# Patient Record
Sex: Female | Born: 1935 | Race: Black or African American | Hispanic: No | Marital: Married | Smoking: Never smoker
Health system: Southern US, Community
[De-identification: ages and names within clinical notes are randomized; demographics above are authoritative.]

## PROBLEM LIST (undated history)

## (undated) DIAGNOSIS — R002 Palpitations: Secondary | ICD-10-CM

## (undated) DIAGNOSIS — I479 Paroxysmal tachycardia, unspecified: Secondary | ICD-10-CM

## (undated) DIAGNOSIS — R000 Tachycardia, unspecified: Secondary | ICD-10-CM

## (undated) DIAGNOSIS — R413 Other amnesia: Secondary | ICD-10-CM

## (undated) DIAGNOSIS — R269 Unspecified abnormalities of gait and mobility: Secondary | ICD-10-CM

## (undated) DIAGNOSIS — M1990 Unspecified osteoarthritis, unspecified site: Secondary | ICD-10-CM

## (undated) DIAGNOSIS — M069 Rheumatoid arthritis, unspecified: Secondary | ICD-10-CM

## (undated) DIAGNOSIS — F068 Other specified mental disorders due to known physiological condition: Secondary | ICD-10-CM

## (undated) DIAGNOSIS — R32 Unspecified urinary incontinence: Secondary | ICD-10-CM

## (undated) HISTORY — DX: Unspecified osteoarthritis, unspecified site: M19.90

## (undated) HISTORY — DX: Unspecified urinary incontinence: R32

## (undated) HISTORY — DX: Other amnesia: R41.3

## (undated) HISTORY — DX: Paroxysmal tachycardia, unspecified: I47.9

## (undated) HISTORY — DX: Tachycardia, unspecified: R00.0

## (undated) HISTORY — DX: Rheumatoid arthritis, unspecified: M06.9

## (undated) HISTORY — DX: Unspecified abnormalities of gait and mobility: R26.9

## (undated) HISTORY — DX: Palpitations: R00.2

## (undated) HISTORY — DX: Other specified mental disorders due to known physiological condition: F06.8

---

## 1999-05-24 ENCOUNTER — Encounter: Payer: Self-pay | Admitting: Internal Medicine

## 2000-01-27 ENCOUNTER — Other Ambulatory Visit: Admission: RE | Admit: 2000-01-27 | Discharge: 2000-01-27 | Payer: Self-pay | Admitting: Internal Medicine

## 2000-08-08 ENCOUNTER — Ambulatory Visit (HOSPITAL_COMMUNITY): Admission: RE | Admit: 2000-08-08 | Discharge: 2000-08-08 | Payer: Self-pay | Admitting: Gastroenterology

## 2000-08-11 ENCOUNTER — Ambulatory Visit (HOSPITAL_COMMUNITY): Admission: RE | Admit: 2000-08-11 | Discharge: 2000-08-11 | Payer: Self-pay | Admitting: Internal Medicine

## 2000-08-11 ENCOUNTER — Encounter: Payer: Self-pay | Admitting: Internal Medicine

## 2003-08-12 ENCOUNTER — Ambulatory Visit (HOSPITAL_COMMUNITY): Admission: RE | Admit: 2003-08-12 | Discharge: 2003-08-12 | Payer: Self-pay | Admitting: *Deleted

## 2006-06-26 ENCOUNTER — Ambulatory Visit (HOSPITAL_COMMUNITY): Admission: RE | Admit: 2006-06-26 | Discharge: 2006-06-26 | Payer: Self-pay | Admitting: *Deleted

## 2012-03-15 ENCOUNTER — Other Ambulatory Visit: Payer: Self-pay | Admitting: Neurology

## 2012-03-15 DIAGNOSIS — M069 Rheumatoid arthritis, unspecified: Secondary | ICD-10-CM

## 2012-03-15 DIAGNOSIS — F0390 Unspecified dementia without behavioral disturbance: Secondary | ICD-10-CM

## 2012-04-30 ENCOUNTER — Other Ambulatory Visit: Payer: Self-pay

## 2012-05-01 ENCOUNTER — Other Ambulatory Visit: Payer: Self-pay

## 2012-05-03 ENCOUNTER — Ambulatory Visit
Admission: RE | Admit: 2012-05-03 | Discharge: 2012-05-03 | Disposition: A | Discharge status: Home / Self Care | Payer: Self-pay | Source: Ambulatory Visit | Attending: Neurology | Admitting: Neurology

## 2012-05-03 DIAGNOSIS — M069 Rheumatoid arthritis, unspecified: Secondary | ICD-10-CM

## 2012-05-03 DIAGNOSIS — F0390 Unspecified dementia without behavioral disturbance: Secondary | ICD-10-CM

## 2012-12-26 ENCOUNTER — Encounter: Payer: Self-pay | Admitting: Nurse Practitioner

## 2012-12-26 ENCOUNTER — Telehealth: Payer: Self-pay | Admitting: *Deleted

## 2012-12-26 NOTE — Telephone Encounter (Signed)
Spoke to the patient and daughter was not home. Patient confirmed appt but not sure if she will give message to the daughter.

## 2012-12-27 ENCOUNTER — Ambulatory Visit (INDEPENDENT_AMBULATORY_CARE_PROVIDER_SITE_OTHER): Payer: 59 | Admitting: Nurse Practitioner

## 2012-12-27 ENCOUNTER — Encounter: Payer: Self-pay | Admitting: Nurse Practitioner

## 2012-12-27 VITALS — BP 108/76 | HR 136 | Ht 67.0 in | Wt 144.0 lb

## 2012-12-27 DIAGNOSIS — F068 Other specified mental disorders due to known physiological condition: Secondary | ICD-10-CM

## 2012-12-27 DIAGNOSIS — G9340 Encephalopathy, unspecified: Secondary | ICD-10-CM | POA: Insufficient documentation

## 2012-12-27 DIAGNOSIS — R269 Unspecified abnormalities of gait and mobility: Secondary | ICD-10-CM | POA: Insufficient documentation

## 2012-12-27 NOTE — Progress Notes (Signed)
[facility]  PATIENT: Darlene Daniels DOB: September 30, 1924   REASON FOR VISIT: Memory loss   HISTORY OF PRESENT ILLNESS:  79 year old right-handed African American female, accompanied by her daughter, returns for followup. She has a history of dementia.  She had a past medical history of rheumatoid arthritis, recent flareup, is currently on methotrexate to 4 tablets each week, Humira infusion every week for one year, her rheumatoid arthritis is under better control now.  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 Daughter however does report frequent falls, she did not have an assistive device, she gets no regular exercise Her mother suffered Alzheimer's disease,    REVIEW OF SYSTEMS: Full 14 system review of systems performed and notable only for:  Constitutional: N/A  Cardiovascular: N/A  Ear/Nose/Throat: N/A  Skin: N/A  Eyes: N/A  Respiratory: N/A  Gastroitestinal: Urinary incontinence Hematology/Lymphatic: N/A  Endocrine: N/A Musculoskeletal:N/A  Allergy/Immunology: N/A  Neurological: Dizziness, falls Psychiatric: Disinterest in activities  ALLERGIES: Allergies  Allergen Reactions  . Sulfa Antibiotics     HOME MEDICATIONS: Outpatient Prescriptions Prior to Visit  Medication Sig Dispense Refill  . celecoxib (CELEBREX) 200 MG capsule Take  200 mg by mouth daily.      . Cholecalciferol (VITAMIN D3) 2000 UNITS TABS Take 5 Units by mouth.       . Memantine HCl ER (NAMENDA XR) 28 MG CP24 Take 28 mg by mouth daily.      . Multiple Vitamin (MULTIVITAMIN) capsule Take 1 capsule by mouth daily.      Marland Kitchen oxybutynin (DITROPAN) 5 MG tablet Take 5 mg by mouth daily. One half daily      . FA-Pyridoxine-Cyancobalamin (FOLBIC PO) Take by mouth.       No facility-administered medications prior to visit.    PAST MEDICAL HISTORY: Past Medical History  Diagnosis Date  . Memory loss     PAST SURGICAL HISTORY: History reviewed. No pertinent past surgical history.  FAMILY HISTORY: Mother had Alzheimers disease  SOCIAL HISTORY: History   Social History  . Marital Status: Married    Spouse Name: N/A    Number of Children: daughter  . Years of Education: PHD   Occupational History   Pension scheme manager.   Social History Main Topics  . Smoking status: Never Smoker   . Smokeless tobacco: Never Used  . Alcohol Use: No  . Drug Use: No  . Sexual Activity: Not on file   Other Topics Concern  . Not on file   Social History Narrative  .   patient lives with her husband and is retired      PHYSICAL EXAM  Filed Vitals:   12/27/12 1537  BP: 108/76  Pulse: 136  Height: 5\' 7"  (1.702 m)  Weight: 144 lb (65.318 kg)   Body mass index is 22.55 kg/(m^2).  Generalized: Well developed, in no acute distress  Head: normocephalic and  atraumatic,. Oropharynx benign  Neck: Supple, no carotid bruits  Cardiac: Regular rate rhythm, no murmur  Musculoskeletal: Rheumatoid arthritis Neurological examination   Mentation: Alert , MMSE 26/30. AFT 10.  Follows all commands speech and language fluent  Cranial nerve II-XII: Pupils were equal round reactive to light extraocular movements were full, visual field were full on confrontational test. Facial sensation and strength were normal. hearing was intact to finger rubbing bilaterally.  Uvula tongue midline. head turning and shoulder shrug and were normal and symmetric.Tongue protrusion into cheek strength was normal. Motor: Rheumatoid arthritis bilateral hand joints, weak grip.Full strength in the BUE, BLE,  Coordination: finger-nose-finger, heel-to-shin bilaterally, no dysmetria Reflexes: Brachioradialis 2/2, biceps 2/2, triceps 2/2, patellar 2/2, Achilles trace  plantar responses were flexor bilaterally. Gait and Station: Rising up from seated position with assistance, unsteady gait,  moderate stride, no assistive device   DIAGNOSTIC DATA (LABS, IMAGING, TESTING) -none to review   ASSESSMENT AND PLAN  80 y.o. year old female  has a past medical history of Memory loss. and gait abnormality  Continue Namenda, memory score is stable Given information on activities for patients with memory loss Order PT for gait abnormality risk for falls, short term  Followup in 6 months  Nilda Riggs, [facility], [facility], APRN  [facility] [address], [address] [place], [place] [address] [phone]

## 2012-12-27 NOTE — Patient Instructions (Addendum)
Continue Namenda, memory score is stable Given information on activities for patients with memory loss Order PT for gait abnormality risk for falls Followup in 6 months

## 2013-02-06 ENCOUNTER — Other Ambulatory Visit: Payer: Self-pay | Admitting: Neurology

## 2013-03-09 ENCOUNTER — Encounter: Payer: Self-pay | Admitting: Interventional Cardiology

## 2013-03-09 ENCOUNTER — Encounter: Payer: Self-pay | Admitting: *Deleted

## 2013-03-09 DIAGNOSIS — M1990 Unspecified osteoarthritis, unspecified site: Secondary | ICD-10-CM | POA: Insufficient documentation

## 2013-03-09 DIAGNOSIS — R413 Other amnesia: Secondary | ICD-10-CM | POA: Insufficient documentation

## 2013-03-09 DIAGNOSIS — R000 Tachycardia, unspecified: Secondary | ICD-10-CM | POA: Insufficient documentation

## 2013-03-09 DIAGNOSIS — R32 Unspecified urinary incontinence: Secondary | ICD-10-CM | POA: Insufficient documentation

## 2013-03-09 DIAGNOSIS — I479 Paroxysmal tachycardia, unspecified: Secondary | ICD-10-CM | POA: Insufficient documentation

## 2013-03-09 DIAGNOSIS — R002 Palpitations: Secondary | ICD-10-CM | POA: Insufficient documentation

## 2013-03-09 DIAGNOSIS — M069 Rheumatoid arthritis, unspecified: Secondary | ICD-10-CM | POA: Insufficient documentation

## 2013-03-10 ENCOUNTER — Ambulatory Visit (INDEPENDENT_AMBULATORY_CARE_PROVIDER_SITE_OTHER): Payer: Medicare Other | Admitting: Interventional Cardiology

## 2013-03-10 ENCOUNTER — Encounter: Payer: Self-pay | Admitting: Interventional Cardiology

## 2013-03-10 VITALS — BP 155/72 | HR 44 | Ht 67.0 in | Wt 140.0 lb

## 2013-03-10 DIAGNOSIS — I495 Sick sinus syndrome: Secondary | ICD-10-CM

## 2013-03-10 DIAGNOSIS — I10 Essential (primary) hypertension: Secondary | ICD-10-CM

## 2013-03-10 DIAGNOSIS — I479 Paroxysmal tachycardia, unspecified: Secondary | ICD-10-CM

## 2013-03-10 DIAGNOSIS — R413 Other amnesia: Secondary | ICD-10-CM

## 2013-03-10 NOTE — Patient Instructions (Signed)
Your physician recommends that you continue on your current medications as directed. Please refer to the Current Medication list given to you today.  Your physician recommends that you schedule a follow-up appointment in: as needed  

## 2013-03-10 NOTE — Progress Notes (Signed)
Patient ID: Darlene Daniels, female   DOB: Nov 08, 1924, 79 y.o.   MRN: [id number] .    [address] [place], [place]  [address] Phone: [phone] Fax:  [phone]  Date:  03/10/2013   ID:  Darlene Daniels, DOB Sep 18, 1924, MRN [id number]  PCP:  Alva Garnet., MD   ASSESSMENT:  1. Tachycardia/palpitations related to PSVT, markedly improved on 37.5 mg of metoprolol daily 2. Tachy-bradycardia syndrome, stable 3. Hypertension 4. Dementia  PLAN:  1. Metoprolol 37.5 mg daily will be maintained at the current level 2. Clinical followup as needed   SUBJECTIVE: Darlene Daniels is a 79 y.o. female who has had marked improvement in symptomatic palpitations since starting metoprolol. She denies syncope. No medication side effects.   Wt Readings from Last 3 Encounters:  03/10/13 140 lb (63.504 kg)  12/27/12 144 lb (65.318 kg)     Past Medical History  Diagnosis Date  . Memory loss   . Palpitations   . Paroxysmal tachycardia, unspecified   . Arthritis   . Urinary incontinence   . Rheumatoid arthritis   . Tachycardia   . Abnormality of gait   . Other persistent mental disorders due to conditions classified elsewhere     Current Outpatient Prescriptions  Medication Sig Dispense Refill  . Adalimumab (HUMIRA [place]) Inject 40 mg into the skin every 14 (fourteen) days.      Marland Kitchen aspirin 81 MG tablet Take 81 mg by mouth daily.      . celecoxib (CELEBREX) 200 MG capsule Take 200 mg by mouth daily.      . Cholecalciferol (VITAMIN D3) 2000 UNITS TABS Take 5 Units by mouth.       . FOLIC ACID PO Take by mouth.      . methotrexate (RHEUMATREX) 10 MG tablet Take 10 mg by mouth once a week. Caution: Chemotherapy. Protect from light.      . metoprolol tartrate (LOPRESSOR) 25 MG tablet Take by mouth. Taking 1.5 tablets Daily      . Multiple Vitamin (MULTIVITAMIN) capsule Take 1 capsule by mouth daily.      Marland Kitchen NAMENDA 10 MG tablet TAKE 1 TABLET BY MOUTH TWICE A DAY  60 tablet  6    . oxybutynin (DITROPAN) 5 MG tablet Take 5 mg by mouth daily. One half daily       No current facility-administered medications for this visit.    Allergies:    Allergies  Allergen Reactions  . Sulfa Antibiotics     Social History:  The patient  reports that she has never smoked. She has never used smokeless tobacco. She reports that she does not drink alcohol or use illicit drugs.   ROS:  Please see the history of present illness.    All other systems reviewed and negative.   OBJECTIVE: VS:  BP 155/72  Pulse 44  Ht 5\' 7"  (1.702 m)  Wt 140 lb (63.504 kg)  BMI 21.92 kg/m2 Well nourished, well developed, in no acute distress, slender, appears stated age HEENT: normal Neck: JVD flat. Carotid bruit absent  Cardiac:  normal S1, S2; ; no murmur Lungs:  clear to auscultation bilaterally, no wheezing, rhonchi or rales Abd: soft, nontender, no hepatomegaly Ext: Edema absent. Pulses palpable Skin: warm and dry Neuro:  CNs 2-12 intact, no focal abnormalities noted  EKG:  Not performed       Signed, Darci Needle III, MD 03/10/2013 4:43 PM

## 2013-03-23 ENCOUNTER — Other Ambulatory Visit: Payer: Self-pay | Admitting: Neurology

## 2013-03-28 ENCOUNTER — Telehealth: Payer: Self-pay | Admitting: Neurology

## 2013-03-28 NOTE — Telephone Encounter (Signed)
Left message for patient to reschedule 3/13 appt per Carolyn's schedule.

## 2013-04-14 ENCOUNTER — Other Ambulatory Visit: Payer: Self-pay | Admitting: Neurology

## 2013-06-20 ENCOUNTER — Other Ambulatory Visit: Payer: Self-pay

## 2013-06-20 MED ORDER — MEMANTINE HCL ER 28 MG PO CP24: 28.0000 mg | ORAL_CAPSULE | Freq: Every day | ORAL | Status: DC

## 2013-06-27 ENCOUNTER — Ambulatory Visit: Payer: Self-pay | Admitting: Nurse Practitioner

## 2013-06-27 ENCOUNTER — Telehealth: Payer: Self-pay | Admitting: Nurse Practitioner

## 2013-06-27 NOTE — Telephone Encounter (Signed)
No show for scheduled appt 

## 2013-07-04 ENCOUNTER — Ambulatory Visit: Payer: Self-pay | Admitting: Nurse Practitioner

## 2013-08-04 ENCOUNTER — Telehealth: Payer: Self-pay | Admitting: Neurology

## 2013-08-04 NOTE — Telephone Encounter (Signed)
Pt's daughter called states [facility] has been faxing over a request for pt's refill on

## 2013-08-05 ENCOUNTER — Other Ambulatory Visit: Payer: Self-pay

## 2013-08-05 MED ORDER — MEMANTINE HCL ER 28 MG PO CP24: 28.0000 mg | ORAL_CAPSULE | Freq: Every day | ORAL | Status: DC

## 2013-10-09 ENCOUNTER — Other Ambulatory Visit: Payer: Self-pay

## 2013-10-09 MED ORDER — METOPROLOL TARTRATE 25 MG PO TABS: ORAL_TABLET | ORAL | Status: DC

## 2013-10-30 ENCOUNTER — Encounter: Payer: Self-pay | Admitting: Interventional Cardiology

## 2014-01-28 ENCOUNTER — Ambulatory Visit: Payer: 59 | Admitting: Nurse Practitioner

## 2014-01-28 ENCOUNTER — Telehealth: Payer: Self-pay | Admitting: Nurse Practitioner

## 2014-01-28 NOTE — Telephone Encounter (Signed)
No show for scheduled appt 

## 2014-01-30 ENCOUNTER — Other Ambulatory Visit: Payer: Self-pay | Admitting: Interventional Cardiology

## 2014-03-04 NOTE — Telephone Encounter (Signed)
The patient never picked up his Namenda XR 28mg  samples.

## 2014-06-15 ENCOUNTER — Other Ambulatory Visit: Payer: Self-pay | Admitting: Interventional Cardiology

## 2014-06-17 ENCOUNTER — Other Ambulatory Visit: Payer: Self-pay | Admitting: Interventional Cardiology

## 2014-08-24 ENCOUNTER — Other Ambulatory Visit: Payer: Self-pay | Admitting: Interventional Cardiology

## 2014-08-25 NOTE — Telephone Encounter (Signed)
Patient is prn follow up and has not been seen since 2014. Should this be sent to pcp? Please advise. Thanks, MI

## 2015-03-20 ENCOUNTER — Other Ambulatory Visit: Payer: Self-pay | Admitting: Interventional Cardiology

## 2015-03-22 NOTE — Telephone Encounter (Signed)
Patient has not been seen since 2014, but is prn follow up. Please advise on refill. Thanks, MI 

## 2015-04-05 ENCOUNTER — Other Ambulatory Visit: Payer: Self-pay | Admitting: Interventional Cardiology

## 2015-05-17 ENCOUNTER — Other Ambulatory Visit: Payer: Self-pay | Admitting: Interventional Cardiology

## 2015-05-17 NOTE — Telephone Encounter (Signed)
Ok to deny?

## 2015-05-17 NOTE — Telephone Encounter (Signed)
Yes

## 2015-05-18 ENCOUNTER — Other Ambulatory Visit: Payer: Self-pay | Admitting: Interventional Cardiology

## 2015-05-24 ENCOUNTER — Other Ambulatory Visit: Payer: Self-pay | Admitting: Interventional Cardiology

## 2015-05-24 NOTE — Telephone Encounter (Signed)
Medication Detail      Disp Refills Start End     metoprolol tartrate (LOPRESSOR) 25 MG tablet 45 tablet 0 08/25/2014     Sig: TAKING 1.5 TABLETS DAILY    Notes to Pharmacy: Further refill request should be sent to patients primary care physician    E-Prescribing Status: Receipt confirmed by pharmacy (08/25/2014 2:22 PM EDT)     Pharmacy    [id number] [place], [place] - [address]

## 2015-05-25 ENCOUNTER — Ambulatory Visit
Admission: RE | Admit: 2015-05-25 | Discharge: 2015-05-25 | Disposition: A | Discharge status: Home / Self Care | Payer: Medicare Other | Source: Ambulatory Visit | Attending: Internal Medicine | Admitting: Internal Medicine

## 2015-05-25 ENCOUNTER — Other Ambulatory Visit: Payer: Self-pay | Admitting: Internal Medicine

## 2015-05-25 DIAGNOSIS — R0602 Shortness of breath: Secondary | ICD-10-CM

## 2015-05-30 ENCOUNTER — Encounter (HOSPITAL_COMMUNITY): Payer: Self-pay | Admitting: *Deleted

## 2015-05-30 ENCOUNTER — Emergency Department (HOSPITAL_COMMUNITY): Payer: Medicare Other

## 2015-05-30 ENCOUNTER — Observation Stay (HOSPITAL_COMMUNITY)
Admission: EM | Admit: 2015-05-30 | Discharge: 2015-06-01 | Disposition: A | Discharge status: Home / Self Care | Payer: Medicare Other | Attending: Internal Medicine | Admitting: Internal Medicine

## 2015-05-30 ENCOUNTER — Other Ambulatory Visit: Payer: Self-pay

## 2015-05-30 DIAGNOSIS — I495 Sick sinus syndrome: Secondary | ICD-10-CM | POA: Diagnosis present

## 2015-05-30 DIAGNOSIS — R413 Other amnesia: Secondary | ICD-10-CM | POA: Diagnosis present

## 2015-05-30 DIAGNOSIS — R7989 Other specified abnormal findings of blood chemistry: Secondary | ICD-10-CM

## 2015-05-30 DIAGNOSIS — I479 Paroxysmal tachycardia, unspecified: Secondary | ICD-10-CM | POA: Diagnosis not present

## 2015-05-30 DIAGNOSIS — Z79899 Other long term (current) drug therapy: Secondary | ICD-10-CM | POA: Insufficient documentation

## 2015-05-30 DIAGNOSIS — N179 Acute kidney failure, unspecified: Secondary | ICD-10-CM

## 2015-05-30 DIAGNOSIS — F0390 Unspecified dementia without behavioral disturbance: Secondary | ICD-10-CM | POA: Diagnosis not present

## 2015-05-30 DIAGNOSIS — I951 Orthostatic hypotension: Secondary | ICD-10-CM | POA: Diagnosis present

## 2015-05-30 DIAGNOSIS — R55 Syncope and collapse: Principal | ICD-10-CM | POA: Insufficient documentation

## 2015-05-30 DIAGNOSIS — R0600 Dyspnea, unspecified: Secondary | ICD-10-CM

## 2015-05-30 DIAGNOSIS — R4182 Altered mental status, unspecified: Secondary | ICD-10-CM | POA: Diagnosis present

## 2015-05-30 DIAGNOSIS — Z7982 Long term (current) use of aspirin: Secondary | ICD-10-CM | POA: Diagnosis not present

## 2015-05-30 DIAGNOSIS — R32 Unspecified urinary incontinence: Secondary | ICD-10-CM | POA: Diagnosis present

## 2015-05-30 DIAGNOSIS — I10 Essential (primary) hypertension: Secondary | ICD-10-CM | POA: Diagnosis not present

## 2015-05-30 DIAGNOSIS — M069 Rheumatoid arthritis, unspecified: Secondary | ICD-10-CM | POA: Diagnosis present

## 2015-05-30 DIAGNOSIS — D539 Nutritional anemia, unspecified: Secondary | ICD-10-CM | POA: Diagnosis present

## 2015-05-30 DIAGNOSIS — M1990 Unspecified osteoarthritis, unspecified site: Secondary | ICD-10-CM | POA: Diagnosis not present

## 2015-05-30 LAB — CBC WITH DIFFERENTIAL/PLATELET
BASOS PCT: 0 %
Basophils Absolute: 0 10*3/uL (ref 0.0–0.1)
EOS PCT: 6 %
Eosinophils Absolute: 0.4 10*3/uL (ref 0.0–0.7)
HEMATOCRIT: 26.6 % — AB (ref 36.0–46.0)
Hemoglobin: 8.6 g/dL — ABNORMAL LOW (ref 12.0–15.0)
Lymphocytes Relative: 20 %
Lymphs Abs: 1.3 10*3/uL (ref 0.7–4.0)
MCH: 33 pg (ref 26.0–34.0)
MCHC: 32.3 g/dL (ref 30.0–36.0)
MCV: 101.9 fL — AB (ref 78.0–100.0)
MONO ABS: 0.4 10*3/uL (ref 0.1–1.0)
MONOS PCT: 7 %
NEUTROS ABS: 4.2 10*3/uL (ref 1.7–7.7)
Neutrophils Relative %: 67 %
Platelets: 169 10*3/uL (ref 150–400)
RBC: 2.61 MIL/uL — ABNORMAL LOW (ref 3.87–5.11)
RDW: 16.1 % — AB (ref 11.5–15.5)
WBC: 6.3 10*3/uL (ref 4.0–10.5)

## 2015-05-30 LAB — TSH: TSH: 0.626 u[IU]/mL (ref 0.350–4.500)

## 2015-05-30 LAB — PHOSPHORUS: PHOSPHORUS: 3.8 mg/dL (ref 2.5–4.6)

## 2015-05-30 LAB — COMPREHENSIVE METABOLIC PANEL
ALBUMIN: 3 g/dL — AB (ref 3.5–5.0)
ALT: 15 U/L (ref 14–54)
AST: 19 U/L (ref 15–41)
Alkaline Phosphatase: 56 U/L (ref 38–126)
Anion gap: 10 (ref 5–15)
BILIRUBIN TOTAL: 0.4 mg/dL (ref 0.3–1.2)
BUN: 21 mg/dL — AB (ref 6–20)
CALCIUM: 8.7 mg/dL — AB (ref 8.9–10.3)
CO2: 23 mmol/L (ref 22–32)
CREATININE: 2.12 mg/dL — AB (ref 0.44–1.00)
Chloride: 106 mmol/L (ref 101–111)
GFR calc Af Amer: 23 mL/min — ABNORMAL LOW (ref >60)
GFR, EST NON AFRICAN AMERICAN: 20 mL/min — AB (ref >60)
GLUCOSE: 147 mg/dL — AB (ref 65–99)
Potassium: 4.6 mmol/L (ref 3.5–5.1)
Sodium: 139 mmol/L (ref 135–145)
TOTAL PROTEIN: 6.2 g/dL — AB (ref 6.5–8.1)

## 2015-05-30 LAB — TROPONIN I
TROPONIN I: 0.08 ng/mL — AB (ref <0.031)
Troponin I: 0.04 ng/mL — ABNORMAL HIGH (ref <0.031)

## 2015-05-30 LAB — IRON AND TIBC
IRON: 39 ug/dL (ref 28–170)
Saturation Ratios: 11 % (ref 10.4–31.8)
TIBC: 370 ug/dL (ref 250–450)
UIBC: 331 ug/dL

## 2015-05-30 LAB — POC OCCULT BLOOD, ED: Fecal Occult Bld: NEGATIVE

## 2015-05-30 LAB — RETICULOCYTES
RBC.: 2.4 MIL/uL — ABNORMAL LOW (ref 3.87–5.11)
RETIC COUNT ABSOLUTE: 91.2 10*3/uL (ref 19.0–186.0)
RETIC CT PCT: 3.8 % — AB (ref 0.4–3.1)

## 2015-05-30 LAB — VITAMIN B12: Vitamin B-12: 995 pg/mL — ABNORMAL HIGH (ref 180–914)

## 2015-05-30 LAB — MAGNESIUM: MAGNESIUM: 2.2 mg/dL (ref 1.7–2.4)

## 2015-05-30 LAB — FERRITIN: FERRITIN: 63 ng/mL (ref 11–307)

## 2015-05-30 MED ORDER — MEMANTINE HCL ER 28 MG PO CP24
28.0000 mg | ORAL_CAPSULE | Freq: Every day | ORAL | Status: DC
  Administered 2015-05-30 – 2015-06-01 (×3): 28 mg via ORAL
  Filled 2015-05-30 (×4): qty 1

## 2015-05-30 MED ORDER — SODIUM CHLORIDE 0.9 % IV BOLUS (SEPSIS)
1000.0000 mL | Freq: Once | INTRAVENOUS | Status: AC
  Administered 2015-05-30: 1000 mL via INTRAVENOUS

## 2015-05-30 MED ORDER — OXYBUTYNIN CHLORIDE 5 MG PO TABS
5.0000 mg | ORAL_TABLET | Freq: Two times a day (BID) | ORAL | Status: DC
  Administered 2015-05-30 – 2015-06-01 (×4): 5 mg via ORAL
  Filled 2015-05-30 (×4): qty 1

## 2015-05-30 MED ORDER — VITAMIN C 500 MG PO TABS
1000.0000 mg | ORAL_TABLET | Freq: Every day | ORAL | Status: DC
  Administered 2015-05-31 – 2015-06-01 (×2): 1000 mg via ORAL
  Filled 2015-05-30 (×2): qty 2

## 2015-05-30 MED ORDER — ACETAMINOPHEN 650 MG RE SUPP: 650.0000 mg | Freq: Four times a day (QID) | RECTAL | Status: DC | PRN

## 2015-05-30 MED ORDER — VITAMIN D 1000 UNITS PO TABS
5000.0000 [IU] | ORAL_TABLET | Freq: Every day | ORAL | Status: DC
  Administered 2015-05-31 – 2015-06-01 (×2): 5000 [IU] via ORAL
  Filled 2015-05-30 (×2): qty 5

## 2015-05-30 MED ORDER — ASPIRIN EC 81 MG PO TBEC
81.0000 mg | DELAYED_RELEASE_TABLET | Freq: Every day | ORAL | Status: DC
  Administered 2015-05-31 – 2015-06-01 (×2): 81 mg via ORAL
  Filled 2015-05-30 (×2): qty 1

## 2015-05-30 MED ORDER — AMLODIPINE BESYLATE 5 MG PO TABS
5.0000 mg | ORAL_TABLET | Freq: Every evening | ORAL | Status: DC
  Administered 2015-05-30: 5 mg via ORAL
  Filled 2015-05-30: qty 1

## 2015-05-30 MED ORDER — SODIUM CHLORIDE 0.9% FLUSH
3.0000 mL | Freq: Two times a day (BID) | INTRAVENOUS | Status: DC
  Administered 2015-05-30 – 2015-06-01 (×3): 3 mL via INTRAVENOUS

## 2015-05-30 MED ORDER — SODIUM CHLORIDE 0.9 % IV SOLN
Freq: Once | INTRAVENOUS | Status: AC
  Administered 2015-05-30: 16:00:00 via INTRAVENOUS

## 2015-05-30 MED ORDER — ADULT MULTIVITAMIN W/MINERALS CH
1.0000 | ORAL_TABLET | Freq: Every day | ORAL | Status: DC
  Administered 2015-05-31 – 2015-06-01 (×2): 1 via ORAL
  Filled 2015-05-30 (×2): qty 1

## 2015-05-30 MED ORDER — SODIUM CHLORIDE 0.9 % IV SOLN: INTRAVENOUS | Status: DC

## 2015-05-30 MED ORDER — CELECOXIB 200 MG PO CAPS
200.0000 mg | ORAL_CAPSULE | Freq: Two times a day (BID) | ORAL | Status: DC
  Administered 2015-05-30 – 2015-06-01 (×4): 200 mg via ORAL
  Filled 2015-05-30 (×6): qty 1

## 2015-05-30 MED ORDER — METOPROLOL TARTRATE 12.5 MG HALF TABLET
12.5000 mg | ORAL_TABLET | Freq: Two times a day (BID) | ORAL | Status: DC
  Administered 2015-05-31: 12.5 mg via ORAL
  Filled 2015-05-30 (×2): qty 1

## 2015-05-30 MED ORDER — MULTIVITAMINS PO CAPS: 1.0000 | ORAL_CAPSULE | Freq: Every day | ORAL | Status: DC

## 2015-05-30 MED ORDER — GABAPENTIN 100 MG PO CAPS
100.0000 mg | ORAL_CAPSULE | Freq: Every evening | ORAL | Status: DC
  Administered 2015-05-30 – 2015-06-01 (×3): 100 mg via ORAL
  Filled 2015-05-30 (×3): qty 1

## 2015-05-30 MED ORDER — ZINC 40 MG PO TABS: 80.0000 mg | ORAL_TABLET | Freq: Every day | ORAL | Status: DC

## 2015-05-30 MED ORDER — ACETAMINOPHEN 325 MG PO TABS: 650.0000 mg | ORAL_TABLET | Freq: Four times a day (QID) | ORAL | Status: DC | PRN

## 2015-05-30 MED ORDER — ENOXAPARIN SODIUM 40 MG/0.4ML ~~LOC~~ SOLN
40.0000 mg | SUBCUTANEOUS | Status: DC
  Filled 2015-05-30: qty 0.4

## 2015-05-30 MED ORDER — FOLIC ACID 0.5 MG HALF TAB
400.0000 ug | ORAL_TABLET | Freq: Every day | ORAL | Status: DC
  Filled 2015-05-30: qty 1

## 2015-05-30 MED ORDER — HYPROMELLOSE (GONIOSCOPIC) 2.5 % OP SOLN
1.0000 [drp] | Freq: Two times a day (BID) | OPHTHALMIC | Status: DC
  Administered 2015-05-30 – 2015-06-01 (×4): 1 [drp] via OPHTHALMIC
  Filled 2015-05-30: qty 15

## 2015-05-30 MED ORDER — ENOXAPARIN SODIUM 30 MG/0.3ML ~~LOC~~ SOLN
30.0000 mg | SUBCUTANEOUS | Status: DC
  Administered 2015-05-30 – 2015-05-31 (×2): 30 mg via SUBCUTANEOUS
  Filled 2015-05-30 (×2): qty 0.3

## 2015-05-30 NOTE — ED Provider Notes (Signed)
CSN: [id number]     Arrival date & time 05/30/15  1257 History   First MD Initiated Contact with Patient 05/30/15 1303     Chief Complaint  Patient presents with  . Altered Mental Status     (Consider location/radiation/quality/duration/timing/severity/associated sxs/prior Treatment) HPI Comments: Patient presents from home after syncopal episode while sitting at the breakfast table family states she lost consciousness. She reportedly did not have a pulse and CPR was performed by family members for about 1 minute. Patient then "woke up". She is now awake and alert. She denies any chest pain or shortness of breath. She is oriented 3. She does have dementia at baseline. Family reports she did have slurred speech initially which is now improved. No focal neurological deficits. Denies any dizziness or lightheadedness. Denies abdominal pain, nausea, vomiting or diarrhea. No focal weakness, numbness or tingling. No tongue biting or incontinence.  The history is provided by the patient and the EMS personnel. The history is limited by the condition of the patient.    Past Medical History  Diagnosis Date  . Memory loss   . Palpitations   . Paroxysmal tachycardia, unspecified (HCC)   . Arthritis   . Urinary incontinence   . Rheumatoid arthritis (HCC)   . Tachycardia   . Abnormality of gait   . Other persistent mental disorders due to conditions classified elsewhere    History reviewed. No pertinent past surgical history. No family history on file. Social History  Substance Use Topics  . Smoking status: Never Smoker   . Smokeless tobacco: Never Used  . Alcohol Use: No   OB History    No data available     Review of Systems  Unable to perform ROS: Dementia      Allergies  Sulfa antibiotics  Home Medications   Prior to Admission medications   Medication Sig Start Date End Date Taking? Authorizing Provider  Adalimumab (HUMIRA ) Inject 40 mg into the skin every 14 (fourteen)  days.   Yes Historical Provider, MD  amLODipine (NORVASC) 5 MG tablet Take 5 mg by mouth every evening.   Yes Historical Provider, MD  Ascorbic Acid (VITAMIN C) 1000 MG tablet Take 1,000 mg by mouth daily.   Yes Historical Provider, MD  aspirin 81 MG tablet Take 81 mg by mouth daily.   Yes Historical Provider, MD  celecoxib (CELEBREX) 200 MG capsule Take 200 mg by mouth 2 (two) times daily.    Yes Historical Provider, MD  Cholecalciferol (VITAMIN D3) 2000 UNITS TABS Take 5,000 Units by mouth daily.    Yes Historical Provider, MD  folic acid (FOLVITE) 800 MCG tablet Take 400 mcg by mouth daily.   Yes Historical Provider, MD  gabapentin (NEURONTIN) 100 MG capsule Take 100 mg by mouth every evening.   Yes Historical Provider, MD  hydroxypropyl methylcellulose / hypromellose (ISOPTO TEARS / GONIOVISC) 2.5 % ophthalmic solution Place 1 drop into both eyes 2 (two) times daily.   Yes Historical Provider, MD  Memantine HCl ER (NAMENDA XR) 28 MG CP24 Take 28 mg by mouth daily. 08/05/13  Yes Levert Feinstein, MD  methotrexate (RHEUMATREX) 2.5 MG tablet Take 8 mg by mouth once a week. Sundays 05/16/15  Yes Historical Provider, MD  metoprolol tartrate (LOPRESSOR) 25 MG tablet TAKING 1.5 TABLETS DAILY Patient taking differently: TAKING 1.5 TABLET (37.5mg ) DAILY 08/25/14  Yes Lyn Records, MD  Multiple Vitamin (MULTIVITAMIN) capsule Take 1 capsule by mouth daily.   Yes Historical Provider, MD  oxybutynin (  DITROPAN) 5 MG tablet Take 5 mg by mouth 2 (two) times daily.    Yes Historical Provider, MD  Zinc 40 MG TABS Take 80 mg by mouth daily.   Yes Historical Provider, MD   BP 154/61 mmHg  Pulse 56  Temp(Src) 98.4 F (36.9 C) (Oral)  Resp 18  Ht 5\' 9"  (1.753 m)  Wt 187 lb 2.7 oz (84.9 kg)  BMI 27.63 kg/m2  SpO2 100% Physical Exam  Constitutional: She appears well-developed and well-nourished. No distress.  Oriented to person and place  HENT:  Head: Normocephalic and atraumatic.  Mouth/Throat: Oropharynx is clear  and moist. No oropharyngeal exudate.  Eyes: Conjunctivae and EOM are normal. Pupils are equal, round, and reactive to light.  Neck: Normal range of motion. Neck supple.  No meningismus.  Cardiovascular: Normal rate, regular rhythm, normal heart sounds and intact distal pulses.   No murmur heard. Pulmonary/Chest: Effort normal and breath sounds normal. No respiratory distress.  Abdominal: Soft. There is no tenderness. There is no rebound and no guarding.  Musculoskeletal: Normal range of motion. She exhibits no edema or tenderness.  Arthritic deformities to hands  Neurological: She is alert. No cranial nerve deficit. She exhibits normal muscle tone. Coordination normal.  No ataxia on finger to nose bilaterally. No pronator drift. 5/5 strength throughout. CN 2-12 intact.Equal grip strength. Sensation intact.  No facial droop. Moving all extremities, following commands.  Skin: Skin is warm.  Psychiatric: She has a normal mood and affect. Her behavior is normal.  Nursing note and vitals reviewed.   ED Course  Procedures (including critical care time) Labs Review Labs Reviewed  CBC WITH DIFFERENTIAL/PLATELET - Abnormal; Notable for the following:    RBC 2.61 (*)    Hemoglobin 8.6 (*)    HCT 26.6 (*)    MCV 101.9 (*)    RDW 16.1 (*)    All other components within normal limits  COMPREHENSIVE METABOLIC PANEL - Abnormal; Notable for the following:    Glucose, Bld 147 (*)    BUN 21 (*)    Creatinine, Ser 2.12 (*)    Calcium 8.7 (*)    Total Protein 6.2 (*)    Albumin 3.0 (*)    GFR calc non Af Amer 20 (*)    GFR calc Af Amer 23 (*)    All other components within normal limits  TROPONIN I - Abnormal; Notable for the following:    Troponin I 0.04 (*)    All other components within normal limits  URINALYSIS, ROUTINE W REFLEX MICROSCOPIC (NOT AT [facility])  VITAMIN B12  FOLATE  IRON AND TIBC  FERRITIN  RETICULOCYTES  TROPONIN I  TROPONIN I  POC OCCULT BLOOD, ED    Imaging  Review Dg Chest 2 View  05/30/2015  CLINICAL DATA:  Altered mental status.  Slurred speech. EXAM: CHEST  2 VIEW COMPARISON:  05/25/2015 FINDINGS: There is no focal parenchymal opacity. There is no pleural effusion or pneumothorax. There is stable cardiomegaly. The osseous structures are unremarkable. IMPRESSION: No active cardiopulmonary disease. Electronically Signed   By: 05/27/2015   On: 05/30/2015 14:13   Ct Head Wo Contrast  05/30/2015  CLINICAL DATA:  pt became altered at the breakfast table and had syncopal episode. Family stated pt did not have a pulse and they started CPR. Reported approx 1-2 min of compressions before the pt 'woke'. Slurred speech. EXAM: CT HEAD WITHOUT CONTRAST TECHNIQUE: Contiguous axial images were obtained from the base of the skull through  the vertex without intravenous contrast. COMPARISON:  05/03/2012 FINDINGS: Significant central and cortical atrophy. Periventricular white matter changes are consistent with small vessel disease. There is no intra or extra-axial fluid collection or mass lesion. The basilar cisterns and ventricles have a normal appearance. There is no CT evidence for acute infarction or hemorrhage. Bone windows demonstrate significant opacification of the ethmoid air cells. Small air-fluid level identified within the sphenoid sinuses. Mastoid air cells are normally aerated. No acute calvarial injury. There is atherosclerotic calcification of the internal carotid arteries. IMPRESSION: 1. Atrophy and small vessel disease. 2.  No evidence for acute intracranial abnormality. 3. Acute and chronic sinus changes. Electronically Signed   By: Norva Pavlov M.D.   On: 05/30/2015 14:31   I have personally reviewed and evaluated these images and lab results as part of my medical decision-making.   EKG Interpretation   Date/Time:  Sunday May 30 2015 15:28:27 EST Ventricular Rate:  56 PR Interval:  161 QRS Duration: 86 QT Interval:  413 QTC Calculation:  398 R Axis:   40 Text Interpretation:  Age not entered, assumed to be  82 years old for  purpose of ECG interpretation Sinus rhythm Abnormal R-wave progression,  early transition Borderline T wave abnormalities Nonspecific T wave  abnormality Confirmed by Manus Gunning  MD, Travelle Mcclimans [phone]) on 05/30/2015 3:34:47  PM      MDM   Final diagnoses:  Syncope, unspecified syncope type   syncopal episode with questionable loss of pulses. Patient awake and alert now. Denies symptoms. EKG shows sinus rhythm with PACs.  CT head negative. Labs with minimal troponin elevation, elevated creatinine and anemia, no comparisons.  FOBT negative.  IVF given.  BP stable in the ED but decreasing with standing.  Suspect syncope, likely vasovagal.  Cannot exclude seizure or arrhythmia.  In setting of bystander CPR for possible loss of pulses, plan observation admission.  D/w NP Rennis Harding.  Glynn Octave, MD 05/30/15 [phone]

## 2015-05-30 NOTE — ED Notes (Signed)
To ED via [facility], from home, after family states the pt became altered at the breakfast table and had syncopal episode. Family stated pt did not have a pulse and they started CPR. Reported approx 1-2 min of compressions before the pt 'woke'. At that time pt described to have slurred speech. On EMS arrival, pt was alert to norm without slurred speech. Pt denies cp at this time. Conversing with staff. Repeating questions. Hx of dementia.

## 2015-05-30 NOTE — H&P (Signed)
[facility] History and Physical                                                                                    Darlene Daniels, is a 82 y.o. female  MRN: [id number]   DOB - 27-Aug-1924  Admit Date - 05/30/2015  Outpatient Primary MD for the patient is Alva Garnet., MD  Referring MD: [place] / ER  PMH: Past Medical History  Diagnosis Date  . Memory loss   . Palpitations   . Paroxysmal tachycardia, unspecified (HCC)   . Arthritis   . Urinary incontinence   . Rheumatoid arthritis (HCC)   . Tachycardia   . Abnormality of gait   . Other persistent mental disorders due to conditions classified elsewhere       PSH: History reviewed. No pertinent past surgical history.   CC:  Chief Complaint  Patient presents with  . Altered Mental Status     HPI:  82 year old female with known history of dementia on Namenda XR, apparent bladder control/OAB issues on Ditropan, rheumatoid arthritis on Rheumatrex and Humira, hypertension, history of PAT with tachybradycardia syndrome who was sent to the ER after witnessed syncopal episode at home. No family at bedside at time of my evaluation /interaction with the patient and because of patient's underlying memory issue she is a poor historian and unable to provide accurate history. According to the EDP and nursing documentation EMS was called to the family's home after the patient had a witnessed syncopal episode at the breakfast table. The family thought that the patient did not have a pulse and they began CPR with reported duration of compressions 1 to 2 minutes before the patient "woke up". When she awakened she was described as having slurred speech. Upon EMS arrival to the home the patient was alert and at baseline according to family. She was not having any complaints including chest pain or shortness of breath and apparently was conversing appropriately with EMS staff. According to the EDP note she denied any dizziness or  lightheadedness no abdominal pain nausea vomiting or diarrhea no sick contacts no weakness, tingling or numbness in either extremity and reported the same to me. Upon arrival to the ER she had not had any evidence of incontinence of bowel or bladder. When asked about eating the patient states that "my families always position me to eat,eat,eat, eat"   ER Evaluation and treatment: No temperature was obtained, initial BP was 133/70 with a ECG heart rate of 60 and a Dinamap pulse of 47, respirations 16 with O2 saturations 97% on room air. EKG: Sinus bradycardia with ventricular rate 56 bpm, QTC 398 ms, no ischemic changes-peaked P waves 2 view CXR: No acute process CT head without contrast: Atrophy and small vessel disease no acute intracranial abnormality Laboratory data: BUN 21 creatinine 2.12 with unknown baseline, calcium 8.7, glucose 147, albumin 3.0, troponin 0.04, WBC 6300, hemoglobin 8.6 with baseline unknown, MCV 101.9, FOB neg  Review of Systems   In addition to the HPI above, (based on EDP note per their discussion with family and negative review of systems as below) No Fever-chills, myalgias or other constitutional symptoms No Headache,  changes with Vision or hearing, new weakness, tingling, numbness in any extremity, No problems swallowing food or Liquids, indigestion/reflux No Chest pain, Cough or Shortness of Breath, palpitations, orthopnea or DOE No Abdominal pain, N/V; no melena or hematochezia, no dark tarry stools, Bowel movements are regular, No dysuria, hematuria or flank pain No new skin rashes, lesions, masses or bruises, No new joints pains-aches No recent weight gain or loss No polyuria, polydypsia or polyphagia,  *A full 10 point Review of Systems was done, except as stated above, all other Review of Systems were negative.  Social History Social History  Substance Use Topics  . Smoking status: Never Smoker   . Smokeless tobacco: Never Used  . Alcohol Use: No     Resides at: Private residence  Lives with: Family  Ambulatory status: Patient denies need for rolling walker or wheelchair   Family History No family history on file. due to patient's altered mentation unable to obtain accurate history regarding family and no prior medical history for reference   Prior to Admission medications   Medication Sig Start Date End Date Taking? Authorizing Provider  Adalimumab (HUMIRA Halsey) Inject 40 mg into the skin every 14 (fourteen) days.   Yes Historical Provider, MD  amLODipine (NORVASC) 5 MG tablet Take 5 mg by mouth every evening.   Yes Historical Provider, MD  Ascorbic Acid (VITAMIN C) 1000 MG tablet Take 1,000 mg by mouth daily.   Yes Historical Provider, MD  aspirin 81 MG tablet Take 81 mg by mouth daily.   Yes Historical Provider, MD  celecoxib (CELEBREX) 200 MG capsule Take 200 mg by mouth 2 (two) times daily.    Yes Historical Provider, MD  Cholecalciferol (VITAMIN D3) 2000 UNITS TABS Take 5,000 Units by mouth daily.    Yes Historical Provider, MD  folic acid (FOLVITE) 800 MCG tablet Take 400 mcg by mouth daily.   Yes Historical Provider, MD  gabapentin (NEURONTIN) 100 MG capsule Take 100 mg by mouth every evening.   Yes Historical Provider, MD  hydroxypropyl methylcellulose / hypromellose (ISOPTO TEARS / GONIOVISC) 2.5 % ophthalmic solution Place 1 drop into both eyes 2 (two) times daily.   Yes Historical Provider, MD  Memantine HCl ER (NAMENDA XR) 28 MG CP24 Take 28 mg by mouth daily. 08/05/13  Yes Levert Feinstein, MD  methotrexate (RHEUMATREX) 2.5 MG tablet Take 8 mg by mouth once a week. Sundays 05/16/15  Yes Historical Provider, MD  metoprolol tartrate (LOPRESSOR) 25 MG tablet TAKING 1.5 TABLETS DAILY Patient taking differently: TAKING 1.5 TABLET (37.5mg ) DAILY 08/25/14  Yes Lyn Records, MD  Multiple Vitamin (MULTIVITAMIN) capsule Take 1 capsule by mouth daily.   Yes Historical Provider, MD  oxybutynin (DITROPAN) 5 MG tablet Take 5 mg by mouth 2  (two) times daily.    Yes Historical Provider, MD  Zinc 40 MG TABS Take 80 mg by mouth daily.   Yes Historical Provider, MD    Allergies  Allergen Reactions  . Sulfa Antibiotics     Physical Exam  Vitals  Blood pressure 138/70, pulse 60, resp. rate 18, SpO2 100 %.   General:  In no acute distress, appears stated age  Psych:  Normal affect, oriented to name and place but is also quite fidgety, obvious short-term memory deficits  Neuro:   No focal neurological deficits, CN II through XII intact, Strength 5/5 all 4 extremities, Sensation intact all 4 extremities.  ENT:  Ears and Eyes appear Normal, Conjunctivae clear, PER. Dry oral mucosa  without erythema or exudates.  Neck:  Supple, No lymphadenopathy appreciated  Respiratory:  Symmetrical chest wall movement, Good air movement bilaterally, CTAB. Room Air  Cardiac:  RRR intermittent bradycardic rate, No Murmurs, no LE edema noted, no JVD, No carotid bruits, peripheral pulses palpable at 2+  Abdomen:  Positive bowel sounds, Soft, Non tender, Non distended,  No masses appreciated, no obvious hepatosplenomegaly  Skin:  No Cyanosis, Normal Skin Turgor, No Skin Rash or Bruise.  Extremities: Symmetrical without obvious trauma or injury,  no effusions. Inflammatory arthritic deformities in bilateral hands  Data Review  CBC  Recent Labs Lab 05/30/15 1444  WBC 6.3  HGB 8.6*  HCT 26.6*  PLT 169  MCV 101.9*  MCH 33.0  MCHC 32.3  RDW 16.1*  LYMPHSABS 1.3  MONOABS 0.4  EOSABS 0.4  BASOSABS 0.0    Chemistries   Recent Labs Lab 05/30/15 1444  NA 139  K 4.6  CL 106  CO2 23  GLUCOSE 147*  BUN 21*  CREATININE 2.12*  CALCIUM 8.7*  AST 19  ALT 15  ALKPHOS 56  BILITOT 0.4    CrCl cannot be calculated (Unknown ideal weight.).  No results for input(s): TSH, T4TOTAL, T3FREE, THYROIDAB in the last 72 hours.  Invalid input(s): FREET3  Coagulation profile No results for input(s): INR, PROTIME in the last 168  hours.  No results for input(s): DDIMER in the last 72 hours.  Cardiac Enzymes  Recent Labs Lab 05/30/15 1444  TROPONINI 0.04*    Invalid input(s): POCBNP  Urinalysis No results found for: COLORURINE, APPEARANCEUR, LABSPEC, PHURINE, GLUCOSEU, HGBUR, BILIRUBINUR, KETONESUR, PROTEINUR, UROBILINOGEN, NITRITE, LEUKOCYTESUR  Imaging results:   Dg Chest 2 View  05/30/2015  CLINICAL DATA:  Altered mental status.  Slurred speech. EXAM: CHEST  2 VIEW COMPARISON:  05/25/2015 FINDINGS: There is no focal parenchymal opacity. There is no pleural effusion or pneumothorax. There is stable cardiomegaly. The osseous structures are unremarkable. IMPRESSION: No active cardiopulmonary disease. Electronically Signed   By: Elige Ko   On: 05/30/2015 14:13   Dg Chest 2 View  05/25/2015  CLINICAL DATA:  3-4 weeks of chest congestion associated with shortness of breath no fever EXAM: CHEST  2 VIEW COMPARISON:  Report of a chest x-ray dated August 11, 2000 FINDINGS: The lungs are well-expanded. There are coarse lung markings at the left lung base posteriorly. There is no pleural effusion. The heart is mildly enlarged. The pulmonary vascularity is not engorged. The mediastinum is normal in width. There is stable tortuosity of the descending thoracic aorta. The bony thorax exhibits no acute abnormality. The observed ribs are intact. IMPRESSION: Subsegmental atelectasis or early pneumonia in the left lower lobe. Mild cardiomegaly without pulmonary vascular congestion. Followup PA and lateral chest X-ray is recommended in 3-4 weeks following trial of antibiotic therapy to ensure resolution and exclude underlying malignancy. Electronically Signed   By: David  [place] M.D.   On: 05/25/2015 16:27   Ct Head Wo Contrast  05/30/2015  CLINICAL DATA:  pt became altered at the breakfast table and had syncopal episode. Family stated pt did not have a pulse and they started CPR. Reported approx 1-2 min of compressions before the pt  'woke'. Slurred speech. EXAM: CT HEAD WITHOUT CONTRAST TECHNIQUE: Contiguous axial images were obtained from the base of the skull through the vertex without intravenous contrast. COMPARISON:  05/03/2012 FINDINGS: Significant central and cortical atrophy. Periventricular white matter changes are consistent with small vessel disease. There is no intra or extra-axial fluid  collection or mass lesion. The basilar cisterns and ventricles have a normal appearance. There is no CT evidence for acute infarction or hemorrhage. Bone windows demonstrate significant opacification of the ethmoid air cells. Small air-fluid level identified within the sphenoid sinuses. Mastoid air cells are normally aerated. No acute calvarial injury. There is atherosclerotic calcification of the internal carotid arteries. IMPRESSION: 1. Atrophy and small vessel disease. 2.  No evidence for acute intracranial abnormality. 3. Acute and chronic sinus changes. Electronically Signed   By: Norva Pavlov M.D.   On: 05/30/2015 14:31     EKG: (Independently reviewed) Sinus bradycardia with ventricular rate 56 bpm, QTC 398 ms, no ischemic changes-peaked P waves   Assessment & Plan  Principal Problem:   Syncope/Orthostatic hypotension -Subsequent orthostatic vital signs positive with systolic blood pressure dropping from 133 to 95 with concomitant increase in pulse from 60 bpm to 113 bpm when checked -suspect this is etiology to patient's syncopal episode. At this juncture unclear if medication related or volume related -Admit to telemetry/Obs -Echocardiogram -PT/OT evaluation -Since episode occurred while patient seated at table eating SLP evaluation to assure no associated dysphagia/choking that could've precipitated syncope as well -IV fluids -Repeat orthostatic vital signs in a.m. to ensure patient has been adequately hydrated/euvolemic  Active Problems:   AKI  -Baseline renal function unknown -Hydrate as above -Labs in a.m.     Paroxysmal tachycardia, unspecified/TBS -Continue to monitor -With recent syncope even though has documented orthostatic etiology we'll decrease preadmission Lopressor from 37.5 mg to 12.5 twice a day and monitor since patient also having some apparent episodic bradycardia in ER -Follow telemetry    Essential hypertension, benign -Continue Norvasc and adjusted Lopressor dosage as above    Macrocytic anemia -Check anemia panel and TSH -Baseline hemoglobin unknown -FOB negative in ER    Memory loss -Continue Namenda X are and Celebrex    Urinary incontinence/ ? OAB -Continue Ditropan    Rheumatoid arthritis  -On Humira and Rheumatrex prior to admission    DVT Prophylaxis: Lovenox  Family Communication: No family at bedside    Code Status:  Full code  Condition:  Stable  Discharge disposition: Anticipate discharge home in next 24-48 hours once euvolemic and no further episodes of orthostatic hypotension  Time spent in minutes : 60      ELLIS,ALLISON L. ANP on 05/30/2015 at 5:03 PM  You may contact me by going to [email or web address] - password [email or web address]  I am available from 7a-7p but please confirm I am on the schedule by going to Amion as above.   After 7p please contact night coverage person covering me after hours  [facility]

## 2015-05-31 ENCOUNTER — Observation Stay (HOSPITAL_COMMUNITY): Payer: Medicare Other

## 2015-05-31 DIAGNOSIS — I479 Paroxysmal tachycardia, unspecified: Secondary | ICD-10-CM | POA: Diagnosis not present

## 2015-05-31 DIAGNOSIS — F0390 Unspecified dementia without behavioral disturbance: Secondary | ICD-10-CM | POA: Diagnosis not present

## 2015-05-31 DIAGNOSIS — R55 Syncope and collapse: Secondary | ICD-10-CM | POA: Diagnosis not present

## 2015-05-31 DIAGNOSIS — M1990 Unspecified osteoarthritis, unspecified site: Secondary | ICD-10-CM | POA: Diagnosis not present

## 2015-05-31 LAB — URINALYSIS, ROUTINE W REFLEX MICROSCOPIC
BILIRUBIN URINE: NEGATIVE
GLUCOSE, UA: NEGATIVE mg/dL
Ketones, ur: NEGATIVE mg/dL
Nitrite: NEGATIVE
PH: 7 (ref 5.0–8.0)
Protein, ur: NEGATIVE mg/dL
SPECIFIC GRAVITY, URINE: 1.007 (ref 1.005–1.030)

## 2015-05-31 LAB — CBC
HCT: 27.1 % — ABNORMAL LOW (ref 36.0–46.0)
Hemoglobin: 8.7 g/dL — ABNORMAL LOW (ref 12.0–15.0)
MCH: 32.6 pg (ref 26.0–34.0)
MCHC: 32.1 g/dL (ref 30.0–36.0)
MCV: 101.5 fL — AB (ref 78.0–100.0)
PLATELETS: 204 10*3/uL (ref 150–400)
RBC: 2.67 MIL/uL — AB (ref 3.87–5.11)
RDW: 16.2 % — AB (ref 11.5–15.5)
WBC: 5.9 10*3/uL (ref 4.0–10.5)

## 2015-05-31 LAB — BASIC METABOLIC PANEL
Anion gap: 11 (ref 5–15)
Anion gap: 11 (ref 5–15)
BUN: 18 mg/dL (ref 6–20)
BUN: 15 mg/dL (ref 6–20)
CALCIUM: 8.9 mg/dL (ref 8.9–10.3)
CALCIUM: 9 mg/dL (ref 8.9–10.3)
CHLORIDE: 110 mmol/L (ref 101–111)
CO2: 21 mmol/L — ABNORMAL LOW (ref 22–32)
CO2: 23 mmol/L (ref 22–32)
CREATININE: 1.81 mg/dL — AB (ref 0.44–1.00)
CREATININE: 1.64 mg/dL — AB (ref 0.44–1.00)
Chloride: 106 mmol/L (ref 101–111)
GFR calc Af Amer: 31 mL/min — ABNORMAL LOW (ref >60)
GFR calc non Af Amer: 24 mL/min — ABNORMAL LOW (ref >60)
GFR calc non Af Amer: 27 mL/min — ABNORMAL LOW (ref >60)
GFR, EST AFRICAN AMERICAN: 28 mL/min — AB (ref >60)
GLUCOSE: 147 mg/dL — AB (ref 65–99)
Glucose, Bld: 117 mg/dL — ABNORMAL HIGH (ref 65–99)
Potassium: 5 mmol/L (ref 3.5–5.1)
Potassium: 4.1 mmol/L (ref 3.5–5.1)
SODIUM: 142 mmol/L (ref 135–145)
Sodium: 140 mmol/L (ref 135–145)

## 2015-05-31 LAB — TROPONIN I
TROPONIN I: 0.04 ng/mL — AB (ref <0.031)
TROPONIN I: 0.04 ng/mL — AB (ref <0.031)

## 2015-05-31 LAB — HEMOGLOBIN A1C
HEMOGLOBIN A1C: 6.3 % — AB (ref 4.8–5.6)
Mean Plasma Glucose: 134 mg/dL

## 2015-05-31 LAB — FOLATE

## 2015-05-31 LAB — URINE MICROSCOPIC-ADD ON

## 2015-05-31 LAB — BRAIN NATRIURETIC PEPTIDE: B NATRIURETIC PEPTIDE 5: 719 pg/mL — AB (ref 0.0–100.0)

## 2015-05-31 LAB — D-DIMER, QUANTITATIVE: D-Dimer, Quant: 11.29 ug/mL-FEU — ABNORMAL HIGH (ref 0.00–0.50)

## 2015-05-31 LAB — GLUCOSE, CAPILLARY: GLUCOSE-CAPILLARY: 107 mg/dL — AB (ref 65–99)

## 2015-05-31 MED ORDER — FOLIC ACID 1 MG PO TABS
1.0000 mg | ORAL_TABLET | Freq: Every day | ORAL | Status: DC
  Administered 2015-05-31 – 2015-06-01 (×2): 1 mg via ORAL
  Filled 2015-05-31 (×2): qty 1

## 2015-05-31 MED ORDER — METOPROLOL TARTRATE 25 MG PO TABS
25.0000 mg | ORAL_TABLET | Freq: Two times a day (BID) | ORAL | Status: DC
  Administered 2015-05-31 – 2015-06-01 (×2): 25 mg via ORAL
  Filled 2015-05-31 (×2): qty 1

## 2015-05-31 MED ORDER — HYDRALAZINE HCL 20 MG/ML IJ SOLN: 2.0000 mg | INTRAMUSCULAR | Status: DC | PRN

## 2015-05-31 MED ORDER — TECHNETIUM TC 99M DIETHYLENETRIAME-PENTAACETIC ACID: 30.5000 | Freq: Once | INTRAVENOUS | Status: DC | PRN

## 2015-05-31 MED ORDER — HYDRALAZINE HCL 20 MG/ML IJ SOLN: 5.0000 mg | INTRAMUSCULAR | Status: DC | PRN

## 2015-05-31 MED ORDER — TECHNETIUM TO 99M ALBUMIN AGGREGATED
4.2800 | Freq: Once | INTRAVENOUS | Status: AC | PRN
Start: 2015-05-31 — End: 2015-05-31
  Administered 2015-05-31: 4 via INTRAVENOUS

## 2015-05-31 NOTE — Progress Notes (Signed)
Paged Craige Cotta for sustained (10-59mins) HR in the 130-140. Got an EKG but at that time the HR had came down to the 80's but has since went back down and back up then back down again in the last hour (see flow sheet). I gave her BID dose of lopressor and at this time the HR is in the 80's. I will continue to monitor the client closely.

## 2015-05-31 NOTE — Evaluation (Signed)
Physical Therapy Evaluation Patient Details Name: Darlene Daniels MRN: [id number] DOB: 10-10-1924 Today's Date: 05/31/2015   History of Present Illness  Pt is a 82 y/o F admitted after syncopal epsiode while eating breakfast at home.  Pt's PMH include dementia, urinary incontinence, rheumatoid arthritis, tachycardia.  Clinical Impression  Pt admitted with above diagnosis. Pt currently with functional limitations due to the deficits listed below (see PT Problem List). Darlene Daniels is at her baseline level of mobility and will continue to have 24/7 assist available at home upon d/c.  She currently requires min assist for safe transfers and will benefit from HHPT at d/c.   Pt will benefit from skilled PT to increase their independence and safety with mobility to allow discharge to the venue listed below.      Follow Up Recommendations Home health PT;Supervision/Assistance - 24 hour (max out [facility] services to provide continued 24/7 assist)    Equipment Recommendations  None recommended by PT    Recommendations for Other Services       Precautions / Restrictions Precautions Precautions: Fall Precaution Comments: admitted for syncope, orthostatic Restrictions Weight Bearing Restrictions: No      Mobility  Bed Mobility Overal bed mobility: Needs Assistance Bed Mobility: Supine to Sit     Supine to sit: Min guard     General bed mobility comments: pt in chair  Transfers Overall transfer level: Needs assistance Equipment used: Rolling walker (2 wheeled) Transfers: Sit to/from Stand Sit to Stand: Min assist Stand pivot transfers: Min guard       General transfer comment: assist to rise and cues for hand placement  Ambulation/Gait                Stairs            Wheelchair Mobility    Modified Rankin (Stroke Patients Only)       Balance Overall balance assessment: Needs assistance Sitting-balance support: Bilateral upper extremity supported;Feet  supported Sitting balance-Leahy Scale: Fair     Standing balance support: Bilateral upper extremity supported;During functional activity Standing balance-Leahy Scale: Poor Standing balance comment: RW for support                             Pertinent Vitals/Pain Pain Assessment: No/denies pain Faces Pain Scale: No hurt    Home Living Family/patient expects to be discharged to:: Private residence Living Arrangements: Alone Available Help at Discharge: Family;Personal care attendant;Available 24 hours/day Type of Home: House         Home Equipment: Walker - 4 wheels Additional Comments: Pt has personal care attendant and family w/ her 24/7.    Prior Function Level of Independence: Needs assistance   Gait / Transfers Assistance Needed: Uses rollator to ambulate short distances in home.  Requires cues for posture for safety.  ADL's / Homemaking Assistance Needed: assist for sponge bathing, dressing, grooming and toileting, pt self feeds with set up.        Hand Dominance        Extremity/Trunk Assessment   Upper Extremity Assessment: Generalized weakness (arthritic changes)           Lower Extremity Assessment: Defer to PT evaluation      Cervical / Trunk Assessment: Kyphotic  Communication   Communication: No difficulties  Cognition Arousal/Alertness: Awake/alert Behavior During Therapy: WFL for tasks assessed/performed Overall Cognitive Status: History of cognitive impairments - at baseline  General Comments General comments (skin integrity, edema, etc.): BP supine: 160/87, sitting EOB 157/91, Standing at bedside 138/74, sitting in recliner chair at end of session 139/88    Exercises        Assessment/Plan    PT Assessment Patient needs continued PT services  PT Diagnosis Difficulty walking;Acute pain;Generalized weakness   PT Problem List Decreased strength;Decreased activity tolerance;Decreased  balance;Decreased mobility;Decreased cognition;Decreased knowledge of use of DME;Decreased safety awareness  PT Treatment Interventions DME instruction;Gait training;Functional mobility training;Therapeutic activities;Balance training;Therapeutic exercise;Neuromuscular re-education;Cognitive remediation;Patient/family education   PT Goals (Current goals can be found in the Care Plan section) Acute Rehab PT Goals Patient Stated Goal: none stated PT Goal Formulation: With family Time For Goal Achievement: 06/14/15 Potential to Achieve Goals: Good    Frequency Min 3X/week   Barriers to discharge        Co-evaluation               End of Session Equipment Utilized During Treatment: Gait belt Activity Tolerance: Patient tolerated treatment well Patient left: in chair;with call bell/phone within reach;with chair alarm set;with family/visitor present Nurse Communication: Mobility status;Other (comment) (BP)    Functional Assessment Tool Used: Clinical Judgement Functional Limitation: Mobility: Walking and moving around Mobility: Walking and Moving Around Current Status [phone]): At least 20 percent but less than 40 percent impaired, limited or restricted Mobility: Walking and Moving Around Goal Status [phone]): At least 20 percent but less than 40 percent impaired, limited or restricted    Time: 1145-1211 PT Time Calculation (min) (ACUTE ONLY): 26 min   Charges:   PT Evaluation $PT Eval Moderate Complexity: 1 Procedure PT Treatments $Therapeutic Activity: 8-22 mins   PT G Codes:   PT G-Codes **NOT FOR INPATIENT CLASS** Functional Assessment Tool Used: Clinical Judgement Functional Limitation: Mobility: Walking and moving around Mobility: Walking and Moving Around Current Status (T5176): At least 20 percent but less than 40 percent impaired, limited or restricted Mobility: Walking and Moving Around Goal Status [phone]): At least 20 percent but less than 40 percent impaired,  limited or restricted   Michail Jewels PT, DPT [phone] Pager: [phone] 05/31/2015, 1:50 PM

## 2015-05-31 NOTE — Care Management Obs Status (Signed)
MEDICARE OBSERVATION STATUS NOTIFICATION   Patient Details  Name: Darlene Daniels MRN: [id number] Date of Birth: 06-24-1924   Medicare Observation Status Notification Given:  Yes    Gala Lewandowsky, RN 05/31/2015, 11:40 AM

## 2015-05-31 NOTE — Evaluation (Signed)
Clinical/Bedside Swallow Evaluation Patient Details  Name: Anie Juniel MRN: [id number] Date of Birth: 09/12/1924  Today's Date: 05/31/2015 Time: SLP Start Time (ACUTE ONLY): 0836 SLP Stop Time (ACUTE ONLY): 0855 SLP Time Calculation (min) (ACUTE ONLY): 19 min  Past Medical History:  Past Medical History  Diagnosis Date  . Memory loss   . Palpitations   . Paroxysmal tachycardia, unspecified (HCC)   . Arthritis   . Urinary incontinence   . Rheumatoid arthritis (HCC)   . Tachycardia   . Abnormality of gait   . Other persistent mental disorders due to conditions classified elsewhere    Past Surgical History: History reviewed. No pertinent past surgical history. HPI:  82 y.o. female with h/o dementia, HTN, paroxysmal atrial trachycardia, who presented to ED after syncopal episode at home with aide. Aide performed CPR 1-2 minutes before pt "woke up," per MD note. Slurred speech noted after awakening. CXR 2/5 no active cardiopulmonary disease. CT Head 2/5 no evidence of acute intracranial abnormality.   Assessment / Plan / Recommendation Clinical Impression  SLP provided skilled observation of pt at bedside during mealtime to assess swallow function. Daughter reported no history of difficulty swallowing, food is chopped at all meals. No evidence of dysphagia and no s/s of penetration/aspiration observed. While missing dentition, pt able to masticate solids effectively and timely initiation of swallow observed. Pt and daughter educated re: s/s of penetration/aspiration, progression of dementia and future diet recommendations. Recommend regular diet, thin liquids, meds whole with liquid. No SLP intervention is warranted at this time.     Aspiration Risk  Mild aspiration risk    Diet Recommendation Regular;Thin liquid   Liquid Administration via: Cup;Straw Medication Administration: Whole meds with liquid Supervision: Full supervision/cueing for compensatory strategies;Staff to assist with  self feeding Compensations: Minimize environmental distractions;Slow rate;Small sips/bites Postural Changes: Seated upright at 90 degrees    Other  Recommendations Oral Care Recommendations: Oral care BID;Staff/trained caregiver to provide oral care   Follow up Recommendations  24 hour supervision/assistance    Frequency and Duration            Prognosis Prognosis for Safe Diet Advancement: Good Barriers to Reach Goals: Cognitive deficits      Swallow Study   General HPI: 82 y.o. female with h/o dementia, HTN, paroxysmal atrial trachycardia, who presented to ED after syncopal episode at home with aide. Aide performed CPR 1-2 minutes before pt "woke up," per MD note. Slurred speech noted after awakening. CXR 2/5 no active cardiopulmonary disease. CT Head 2/5 no evidence of acute intracranial abnormality. Type of Study: Bedside Swallow Evaluation Previous Swallow Assessment: none found Diet Prior to this Study: Regular;Thin liquids Temperature Spikes Noted: No Respiratory Status: Room air History of Recent Intubation: No Behavior/Cognition: Cooperative;Pleasant mood;Lethargic/Drowsy Oral Cavity Assessment: Dry Oral Care Completed by SLP: Yes Oral Cavity - Dentition: Missing dentition Vision: Functional for self-feeding Self-Feeding Abilities: Total assist Patient Positioning: Upright in bed Baseline Vocal Quality: Normal Volitional Cough: Strong Volitional Swallow: Able to elicit    Oral/Motor/Sensory Function Overall Oral Motor/Sensory Function: Within functional limits   Ice Chips Ice chips: Not tested   Thin Liquid Thin Liquid: Within functional limits    Nectar Thick Nectar Thick Liquid: Not tested   Honey Thick Honey Thick Liquid: Not tested   Puree Puree: Not tested   Solid   GO   Solid: Within functional limits Presentation: Self Fed (with hand-over-hand assist)        Quince Santana 05/31/2015,9:48 AM

## 2015-05-31 NOTE — Progress Notes (Signed)
[facility] PROGRESS NOTE  Darlene Daniels TDV:[id number] DOB: 03/01/1925 DOA: 05/30/2015 PCP: Alva Garnet., MD  Assessment/Plan:  Principal Problem:   Syncope: likely orthostatic hypotension. Repeat orthostatics. Await echo. Await PT eval. Family reports worsening DOE. Will also check bnp and d dimer. If d dimer positive, need to r/o PE. If bnp elevated, need to slow fluids Active Problems:   Memory loss   Paroxysmal tachycardia, unspecified (HCC): increase metoprolol as tolerated  Urinary incontinence   Rheumatoid arthritis (HCC)   Essential hypertension, benign   Tachy-brady syndrome (HCC)   Orthostatic hypotension see above   Macrocytic anemia: anemia panel ok   AKI (acute kidney injury) (HCC): baseline unknown. Improving with hydration  HPI/Subjective: No complaints  Objective: Filed Vitals:   05/31/15 0424 05/31/15 0820  BP: 160/87 146/83  Pulse: 65 66  Temp:    Resp:      Intake/Output Summary (Last 24 hours) at 05/31/15 1055 Last data filed at 05/31/15 0552  Gross per 24 hour  Intake   1653 ml  Output   1450 ml  Net    203 ml   Filed Weights   05/30/15 1714 05/31/15 0415  Weight: 84.9 kg (187 lb 2.7 oz) 79.1 kg (174 lb 6.1 oz)    Exam:   General:  In chair. Alert. confused  Cardiovascular: RRR without MGR  Respiratory: CTA without WRR  Abdomen: s, nt, nd  Ext: no CCE  Basic Metabolic Panel:  Recent Labs Lab 05/30/15 1444 05/30/15 1840 05/31/15 0215  NA 139  --  142  K 4.6  --  5.0  CL 106  --  110  CO2 23  --  21*  GLUCOSE 147*  --  117*  BUN 21*  --  18  CREATININE 2.12*  --  1.81*  CALCIUM 8.7*  --  8.9  MG  --  2.2  --   PHOS  --  3.8  --    Liver Function Tests:  Recent Labs Lab 05/30/15 1444  AST 19  ALT 15  ALKPHOS 56  BILITOT 0.4  PROT 6.2*  ALBUMIN 3.0*   No results for input(s): LIPASE, AMYLASE in the last 168 hours. No results for input(s): AMMONIA in the last 168 hours. CBC:  Recent Labs Lab  05/30/15 1444 05/31/15 0215  WBC 6.3 5.9  NEUTROABS 4.2  --   HGB 8.6* 8.7*  HCT 26.6* 27.1*  MCV 101.9* 101.5*  PLT 169 204   Cardiac Enzymes:  Recent Labs Lab 05/30/15 1444 05/30/15 1840 05/31/15 0215  TROPONINI 0.04* 0.08* 0.04*   BNP (last 3 results) No results for input(s): BNP in the last 8760 hours.  ProBNP (last 3 results) No results for input(s): PROBNP in the last 8760 hours.  CBG:  Recent Labs Lab 05/31/15 0814  GLUCAP 107*    No results found for this or any previous visit (from the past 240 hour(s)).   Studies: Dg Chest 2 View  05/30/2015  CLINICAL DATA:  Altered mental status.  Slurred speech. EXAM: CHEST  2 VIEW COMPARISON:  05/25/2015 FINDINGS: There is no focal parenchymal opacity. There is no pleural effusion or pneumothorax. There is stable cardiomegaly. The osseous structures are unremarkable. IMPRESSION: No active cardiopulmonary disease. Electronically Signed   By: Elige Ko   On: 05/30/2015 14:13   Ct Head Wo Contrast  05/30/2015  CLINICAL DATA:  pt became altered at the breakfast table and had syncopal episode. Family stated pt did not have a pulse and they  started CPR. Reported approx 1-2 min of compressions before the pt 'woke'. Slurred speech. EXAM: CT HEAD WITHOUT CONTRAST TECHNIQUE: Contiguous axial images were obtained from the base of the skull through the vertex without intravenous contrast. COMPARISON:  05/03/2012 FINDINGS: Significant central and cortical atrophy. Periventricular white matter changes are consistent with small vessel disease. There is no intra or extra-axial fluid collection or mass lesion. The basilar cisterns and ventricles have a normal appearance. There is no CT evidence for acute infarction or hemorrhage. Bone windows demonstrate significant opacification of the ethmoid air cells. Small air-fluid level identified within the sphenoid sinuses. Mastoid air cells are normally aerated. No acute calvarial injury. There is  atherosclerotic calcification of the internal carotid arteries. IMPRESSION: 1. Atrophy and small vessel disease. 2.  No evidence for acute intracranial abnormality. 3. Acute and chronic sinus changes. Electronically Signed   By: Norva Pavlov M.D.   On: 05/30/2015 14:31    Scheduled Meds: . amLODipine  5 mg Oral QPM  . aspirin EC  81 mg Oral Daily  . celecoxib  200 mg Oral BID  . cholecalciferol  5,000 Units Oral Daily  . enoxaparin (LOVENOX) injection  30 mg Subcutaneous Q24H  . folic acid  1 mg Oral Daily  . gabapentin  100 mg Oral QPM  . hydroxypropyl methylcellulose / hypromellose  1 drop Both Eyes BID  . memantine  28 mg Oral Daily  . metoprolol tartrate  12.5 mg Oral BID  . multivitamin with minerals  1 tablet Oral Daily  . oxybutynin  5 mg Oral BID  . sodium chloride flush  3 mL Intravenous Q12H  . vitamin C  1,000 mg Oral Daily   Continuous Infusions: . sodium chloride      Time spent: 35 minutes  Trenace Coughlin L  [facility]  [email or web address], password [facility] 05/31/2015, 10:55 AM

## 2015-05-31 NOTE — Evaluation (Signed)
Occupational Therapy Evaluation Patient Details Name: Darlene Daniels MRN: [id number] DOB: 1925/01/25 Today's Date: 05/31/2015    History of Present Illness Pt is a 82 y/o F admitted after syncopal epsiode while eating breakfast at home.  Pt's PMH include dementia, urinary incontinence, rheumatoid arthritis, tachycardia.   Clinical Impression   Pt is dependent in all ADL and assisted to ambulate short distances with her rollator.  Pt presents near her baseline.  No OT needs identified.   Follow Up Recommendations  No OT follow up;Supervision/Assistance - 24 hour    Equipment Recommendations  None recommended by OT    Recommendations for Other Services       Precautions / Restrictions Precautions Precautions: Fall Precaution Comments: admitted for syncope, orthostatic Restrictions Weight Bearing Restrictions: No      Mobility Bed Mobility      General bed mobility comments: pt in chair  Transfers Overall transfer level: Needs assistance Equipment used: Rolling walker (2 wheeled) Transfers: Sit to/from Stand Sit to Stand: Min assist Stand pivot transfers: Min guard       General transfer comment: assist to rise and cues for hand placement    Balance Overall balance assessment: Needs assistance Sitting-balance support: Bilateral upper extremity supported;Feet supported Sitting balance-Leahy Scale: Fair     Standing balance support: Bilateral upper extremity supported;During functional activity Standing balance-Leahy Scale: Poor Standing balance comment: RW for support                            ADL Overall ADL's : At baseline                                             Vision     Perception     Praxis      Pertinent Vitals/Pain Pain Assessment: No/denies pain Faces Pain Scale: No hurt     Hand Dominance     Extremity/Trunk Assessment Upper Extremity Assessment Upper Extremity Assessment: Generalized weakness  (arthritic changes)   Lower Extremity Assessment Lower Extremity Assessment: Defer to PT evaluation   Cervical / Trunk Assessment Cervical / Trunk Assessment: Kyphotic   Communication Communication Communication: No difficulties   Cognition Arousal/Alertness: Awake/alert Behavior During Therapy: WFL for tasks assessed/performed Overall Cognitive Status: History of cognitive impairments - at baseline                     General Comments       Exercises       Shoulder Instructions      Home Living Family/patient expects to be discharged to:: Private residence Living Arrangements: Alone Available Help at Discharge: Family;Personal care attendant;Available 24 hours/day Type of Home: House                       Home Equipment: Walker - 4 wheels   Additional Comments: Pt has personal care attendant and family w/ her 24/7.      Prior Functioning/Environment Level of Independence: Needs assistance  Gait / Transfers Assistance Needed: Uses rollator to ambulate short distances in home.  Requires cues for posture for safety. ADL's / Homemaking Assistance Needed: assist for sponge bathing, dressing, grooming and toileting, pt self feeds with set up.        OT Diagnosis: Generalized weakness;Cognitive deficits   OT Problem List:  OT Treatment/Interventions:      OT Goals(Current goals can be found in the care plan section)    OT Frequency:     Barriers to D/C:            Co-evaluation              End of Session    Activity Tolerance: Patient limited by fatigue Patient left: in chair;with call bell/phone within reach;with family/visitor present   Time: 5284-1324 OT Time Calculation (min): 19 min Charges:  OT General Charges $OT Visit: 1 Procedure OT Evaluation $OT Eval Moderate Complexity: 1 Procedure G-Codes: OT G-codes **NOT FOR INPATIENT CLASS** Functional Assessment Tool Used: clinical judgement Functional Limitation: Self  care Self Care Current Status (M0102): At least 80 percent but less than 100 percent impaired, limited or restricted Self Care Goal Status (V2536): At least 80 percent but less than 100 percent impaired, limited or restricted Self Care Discharge Status [phone]): At least 80 percent but less than 100 percent impaired, limited or restricted  Evern Bio 05/31/2015, 1:47 PM  [phone]

## 2015-06-01 ENCOUNTER — Ambulatory Visit (HOSPITAL_BASED_OUTPATIENT_CLINIC_OR_DEPARTMENT_OTHER): Payer: Medicare Other

## 2015-06-01 DIAGNOSIS — R55 Syncope and collapse: Secondary | ICD-10-CM

## 2015-06-01 LAB — BASIC METABOLIC PANEL
Anion gap: 10 (ref 5–15)
BUN: 16 mg/dL (ref 6–20)
CALCIUM: 8.7 mg/dL — AB (ref 8.9–10.3)
CHLORIDE: 107 mmol/L (ref 101–111)
CO2: 23 mmol/L (ref 22–32)
CREATININE: 1.66 mg/dL — AB (ref 0.44–1.00)
GFR calc Af Amer: 31 mL/min — ABNORMAL LOW (ref >60)
GFR calc non Af Amer: 26 mL/min — ABNORMAL LOW (ref >60)
GLUCOSE: 99 mg/dL (ref 65–99)
Potassium: 4.4 mmol/L (ref 3.5–5.1)
Sodium: 140 mmol/L (ref 135–145)

## 2015-06-01 LAB — GLUCOSE, CAPILLARY: Glucose-Capillary: 110 mg/dL — ABNORMAL HIGH (ref 65–99)

## 2015-06-01 MED ORDER — METOPROLOL TARTRATE 50 MG PO TABS: 50.0000 mg | ORAL_TABLET | Freq: Two times a day (BID) | ORAL | Status: DC

## 2015-06-01 MED ORDER — METOPROLOL TARTRATE 25 MG PO TABS: 37.5000 mg | ORAL_TABLET | Freq: Two times a day (BID) | ORAL | Status: DC

## 2015-06-01 NOTE — Progress Notes (Signed)
Echocardiogram 2D Echocardiogram has been performed.  Darlene Daniels 06/01/2015, 12:10 PM

## 2015-06-01 NOTE — Care Management Note (Signed)
Case Management Note  Patient Details  Name: Darlene Daniels MRN: [id number] Date of Birth: Oct 08, 1924  Subjective/Objective:  Pt admitted for syncope. Pt is from home with support of daughter and caregivers. Plan will be for d/c home once stable.                   Action/Plan: CM did offer choice to daughter and she chose [facility] for services. CM did make referral for Services and SOC to begin within 24-48 hrs of d/c. No further needs from CM at this time.    Expected Discharge Date:                  Expected Discharge Plan:  Home w Home Health Services  In-House Referral:  NA  Discharge planning Services  CM Consult  Post Acute Care Choice:  Home Health Choice offered to:  Adult Children  DME Arranged:  N/A DME Agency:  NA  HH Arranged:  PT HH Agency:  [facility]  Status of Service:  Completed, signed off  Medicare Important Message Given:    Date Medicare IM Given:    Medicare IM give by:    Date Additional Medicare IM Given:    Additional Medicare Important Message give by:     If discussed at Long Length of Stay Meetings, dates discussed:    Additional Comments:  Gala Lewandowsky, RN 06/01/2015, 2:33 PM

## 2015-06-01 NOTE — Progress Notes (Signed)
Daughter refused to have the client to be weighed this morning. When I educated her on the importance of weighing every morning before breakfast. She said that the client could be weighed when she got up to use the [facility] before breakfast. I will let the on coming shift know that this need to be completed before she eats.

## 2015-06-01 NOTE — Discharge Summary (Signed)
Physician Discharge Summary  Darlene Daniels ZOX:[id number] DOB: [birth year] DOA: 05/30/2015  PCP: Alva Garnet., MD  Admit date: 05/30/2015 Discharge date: 06/01/2015   Recommendations for Outpatient Follow-up:  1. Monitor orthostatics 2. Resume amlodipine if blood pressure uncontrolled.  3. Monitor BMET 4. Monitor H/H   Discharge Diagnoses:  Principal Problem:   Syncope secondary to orthostatic hypotension Active Problems:   Memory loss   Paroxysmal tachycardia, unspecified (HCC)   Urinary incontinence   Rheumatoid arthritis (HCC)   Essential hypertension, benign   Tachy-brady syndrome (HCC)   Orthostatic hypotension   Macrocytic anemia   AKI (acute kidney injury) (HCC)   Syncope and collaps  Discharge Condition: stable  Diet recommendation: heart healthy  Filed Weights   05/30/15 1714 05/31/15 0415 06/01/15 0915  Weight: 84.9 kg (187 lb 2.7 oz) 79.1 kg (174 lb 6.1 oz) 78.608 kg (173 lb 4.8 oz)    History of present illness:  82 year old female with known history of dementia on Namenda XR, apparent bladder control/OAB issues on Ditropan, rheumatoid arthritis on Rheumatrex and Humira, hypertension, history of PAT with tachybradycardia syndrome who was sent to the ER after witnessed syncopal episode at home. No family at bedside at time of my evaluation /interaction with the patient and because of patient's underlying memory issue she is a poor historian and unable to provide accurate history. According to the EDP and nursing documentation EMS was called to the family's home after the patient had a witnessed syncopal episode at the breakfast table. The family thought that the patient did not have a pulse and they began CPR with reported duration of compressions 1 to 2 minutes before the patient "woke up". When she awakened she was described as having slurred speech. Upon EMS arrival to the home the patient was alert and at baseline according to family. She was not having any  complaints including chest pain or shortness of breath and apparently was conversing appropriately with EMS staff. According to the EDP note she denied any dizziness or lightheadedness no abdominal pain nausea vomiting or diarrhea no sick contacts no weakness, tingling or numbness in either extremity and reported the same to me. Upon arrival to the ER she had not had any evidence of incontinence of bowel or bladder. When asked about eating the patient states that "my families always position me to eat,eat,eat, eat"   Hospital Course:  Admitted to telemetry. Hydrated. Beta blocker dose reduced, then subsequently increased as blood pressure tolerated. Amlodipine stopped. No further syncope. Orthostatics improved. VQ low probability. Echo with normal EF. By discharge, stable and family asking for discharge. Will arrange home PT  Procedures:  none  Consultations:  none  Discharge Exam: Filed Vitals:   06/01/15 0600 06/01/15 1444  BP: 158/80   Pulse: 101   Temp: 98.8 F (37.1 C) 98.8 F (37.1 C)  Resp:      General: alert. confused Cardiovascular: RRR Respiratory: CTA  Discharge Instructions   Discharge Instructions    Diet - low sodium heart healthy    Complete by:  As directed      Increase activity slowly    Complete by:  As directed           Current Discharge Medication List    CONTINUE these medications which have NOT CHANGED   Details  Adalimumab (HUMIRA Remer) Inject 40 mg into the skin every 14 (fourteen) days.    Ascorbic Acid (VITAMIN C) 1000 MG tablet Take 1,000 mg by mouth daily.  aspirin 81 MG tablet Take 81 mg by mouth daily.    celecoxib (CELEBREX) 200 MG capsule Take 200 mg by mouth 2 (two) times daily.     Cholecalciferol (VITAMIN D3) 2000 UNITS TABS Take 5,000 Units by mouth daily.     folic acid (FOLVITE) 800 MCG tablet Take 400 mcg by mouth daily.    gabapentin (NEURONTIN) 100 MG capsule Take 100 mg by mouth every evening.    hydroxypropyl  methylcellulose / hypromellose (ISOPTO TEARS / GONIOVISC) 2.5 % ophthalmic solution Place 1 drop into both eyes 2 (two) times daily.    Memantine HCl ER (NAMENDA XR) 28 MG CP24 Take 28 mg by mouth daily. Qty: 30 capsule, Refills: 1    methotrexate (RHEUMATREX) 2.5 MG tablet Take 8 mg by mouth once a week. Sundays    metoprolol tartrate (LOPRESSOR) 25 MG tablet TAKING 1.5 TABLETS DAILY Qty: 45 tablet, Refills: 0    Multiple Vitamin (MULTIVITAMIN) capsule Take 1 capsule by mouth daily.    oxybutynin (DITROPAN) 5 MG tablet Take 5 mg by mouth 2 (two) times daily.     Zinc 40 MG TABS Take 80 mg by mouth daily.      STOP taking these medications     amLODipine (NORVASC) 5 MG tablet        Allergies  Allergen Reactions  . Sulfa Antibiotics    Follow-up Information    Follow up with Alva Garnet., MD In 1 week.   Specialty:  Internal Medicine   Contact information:   [address] [place] [address] [phone]        The results of significant diagnostics from this hospitalization (including imaging, microbiology, ancillary and laboratory) are listed below for reference.    Significant Diagnostic Studies: Dg Chest 2 View  05/30/2015  CLINICAL DATA:  Altered mental status.  Slurred speech. EXAM: CHEST  2 VIEW COMPARISON:  05/25/2015 FINDINGS: There is no focal parenchymal opacity. There is no pleural effusion or pneumothorax. There is stable cardiomegaly. The osseous structures are unremarkable. IMPRESSION: No active cardiopulmonary disease. Electronically Signed   By: Elige Ko   On: 05/30/2015 14:13   Dg Chest 2 View  05/25/2015  CLINICAL DATA:  3-4 weeks of chest congestion associated with shortness of breath no fever EXAM: CHEST  2 VIEW COMPARISON:  Report of a chest x-ray dated August 11, 2000 FINDINGS: The lungs are well-expanded. There are coarse lung markings at the left lung base posteriorly. There is no pleural effusion. The heart is mildly  enlarged. The pulmonary vascularity is not engorged. The mediastinum is normal in width. There is stable tortuosity of the descending thoracic aorta. The bony thorax exhibits no acute abnormality. The observed ribs are intact. IMPRESSION: Subsegmental atelectasis or early pneumonia in the left lower lobe. Mild cardiomegaly without pulmonary vascular congestion. Followup PA and lateral chest X-ray is recommended in 3-4 weeks following trial of antibiotic therapy to ensure resolution and exclude underlying malignancy. Electronically Signed   By: David  [place] M.D.   On: 05/25/2015 16:27   Ct Head Wo Contrast  05/30/2015  CLINICAL DATA:  pt became altered at the breakfast table and had syncopal episode. Family stated pt did not have a pulse and they started CPR. Reported approx 1-2 min of compressions before the pt 'woke'. Slurred speech. EXAM: CT HEAD WITHOUT CONTRAST TECHNIQUE: Contiguous axial images were obtained from the base of the skull through the vertex without intravenous contrast. COMPARISON:  05/03/2012 FINDINGS: Significant central and  cortical atrophy. Periventricular white matter changes are consistent with small vessel disease. There is no intra or extra-axial fluid collection or mass lesion. The basilar cisterns and ventricles have a normal appearance. There is no CT evidence for acute infarction or hemorrhage. Bone windows demonstrate significant opacification of the ethmoid air cells. Small air-fluid level identified within the sphenoid sinuses. Mastoid air cells are normally aerated. No acute calvarial injury. There is atherosclerotic calcification of the internal carotid arteries. IMPRESSION: 1. Atrophy and small vessel disease. 2.  No evidence for acute intracranial abnormality. 3. Acute and chronic sinus changes. Electronically Signed   By: Norva Pavlov M.D.   On: 05/30/2015 14:31   Nm Pulmonary Perf And Vent  05/31/2015  CLINICAL DATA:  Syncope and dyspnea which began 05/30/2015.  Tachycardia. Palpitations. EXAM: NUCLEAR MEDICINE VENTILATION - PERFUSION LUNG SCAN TECHNIQUE: Ventilation images were obtained in multiple projections using inhaled aerosol Tc-12m DTPA. Perfusion images were obtained in multiple projections after intravenous injection of Tc-16m MAA. RADIOPHARMACEUTICALS:  30.5 millicuries. Technetium-30m DTPA aerosol inhalation and 4.28 millicuries Technetium-4m MAA IV COMPARISON:  Chest radiograph 05/30/2015. FINDINGS: Ventilation: No focal ventilation defect. Central air trapping suspected. Perfusion: No wedge shaped peripheral perfusion defects to suggest acute pulmonary embolism. Incidental: Cardiomegaly. IMPRESSION: Low probability of pulmonary embolus. Electronically Signed   By: Elsie Stain M.D.   On: 05/31/2015 18:59   Echo Left ventricle: The cavity size was normal. Wall thickness was increased in a pattern of mild LVH. The estimated ejection fraction was 55%. Wall motion was normal; there were no regional wall motion abnormalities. Doppler parameters are consistent with abnormal left ventricular relaxation (grade 1 diastolic dysfunction). - Aortic valve: There was no stenosis. There was trivial regurgitation. - Mitral valve: Mildly calcified annulus. There was trivial regurgitation. - Left atrium: The atrium was mildly dilated. - Right ventricle: Poorly visualized. The cavity size was mildly dilated. Systolic function was normal. - Right atrium: Poorly visualized. - Tricuspid valve: Peak RV-RA gradient 23 mmHg. - Systemic veins: IVC was not visualized.  Impressions:  - Technically difficult study with poor acoustic windows. Normal LV size with mild LV hypertrophy. EF 55%. Mildly dilated RV with normal systolic function. No significant valvular abnormalities.   Microbiology: No results found for this or any previous visit (from the past 240 hour(s)).   Labs: Basic Metabolic Panel:  Recent Labs Lab 05/30/15 1444  05/30/15 1840 05/31/15 0215 05/31/15 1304 06/01/15 0842  NA 139  --  142 140 140  K 4.6  --  5.0 4.1 4.4  CL 106  --  110 106 107  CO2 23  --  21* 23 23  GLUCOSE 147*  --  117* 147* 99  BUN 21*  --  18 15 16   CREATININE 2.12*  --  1.81* 1.64* 1.66*  CALCIUM 8.7*  --  8.9 9.0 8.7*  MG  --  2.2  --   --   --   PHOS  --  3.8  --   --   --    Liver Function Tests:  Recent Labs Lab 05/30/15 1444  AST 19  ALT 15  ALKPHOS 56  BILITOT 0.4  PROT 6.2*  ALBUMIN 3.0*   No results for input(s): LIPASE, AMYLASE in the last 168 hours. No results for input(s): AMMONIA in the last 168 hours. CBC:  Recent Labs Lab 05/30/15 1444 05/31/15 0215  WBC 6.3 5.9  NEUTROABS 4.2  --   HGB 8.6* 8.7*  HCT 26.6* 27.1*  MCV 101.9*  101.5*  PLT 169 204   Cardiac Enzymes:  Recent Labs Lab 05/30/15 1444 05/30/15 1840 05/31/15 0215 05/31/15 0859  TROPONINI 0.04* 0.08* 0.04* 0.04*   BNP: BNP (last 3 results)  Recent Labs  05/31/15 1304  BNP 719.0*    ProBNP (last 3 results) No results for input(s): PROBNP in the last 8760 hours.  CBG:  Recent Labs Lab 05/31/15 0814 06/01/15 0729  GLUCAP 107* 110*       Signed:  Christiane Ha MD.  [facility] 06/01/2015, 5:13 PM

## 2015-08-12 ENCOUNTER — Emergency Department (HOSPITAL_COMMUNITY)
Admission: EM | Admit: 2015-08-12 | Discharge: 2015-08-12 | Disposition: A | Discharge status: Home / Self Care | Payer: Medicare Other | Attending: Emergency Medicine | Admitting: Emergency Medicine

## 2015-08-12 ENCOUNTER — Encounter (HOSPITAL_COMMUNITY): Payer: Self-pay | Admitting: Emergency Medicine

## 2015-08-12 DIAGNOSIS — Z79899 Other long term (current) drug therapy: Secondary | ICD-10-CM | POA: Diagnosis not present

## 2015-08-12 DIAGNOSIS — R55 Syncope and collapse: Secondary | ICD-10-CM

## 2015-08-12 DIAGNOSIS — I479 Paroxysmal tachycardia, unspecified: Secondary | ICD-10-CM | POA: Insufficient documentation

## 2015-08-12 DIAGNOSIS — Z7982 Long term (current) use of aspirin: Secondary | ICD-10-CM | POA: Insufficient documentation

## 2015-08-12 DIAGNOSIS — M069 Rheumatoid arthritis, unspecified: Secondary | ICD-10-CM | POA: Insufficient documentation

## 2015-08-12 DIAGNOSIS — Z791 Long term (current) use of non-steroidal anti-inflammatories (NSAID): Secondary | ICD-10-CM | POA: Insufficient documentation

## 2015-08-12 LAB — BASIC METABOLIC PANEL
ANION GAP: 9 (ref 5–15)
BUN: 32 mg/dL — ABNORMAL HIGH (ref 6–20)
CO2: 24 mmol/L (ref 22–32)
Calcium: 9.5 mg/dL (ref 8.9–10.3)
Chloride: 105 mmol/L (ref 101–111)
Creatinine, Ser: 1.84 mg/dL — ABNORMAL HIGH (ref 0.44–1.00)
GFR calc Af Amer: 27 mL/min — ABNORMAL LOW (ref >60)
GFR, EST NON AFRICAN AMERICAN: 23 mL/min — AB (ref >60)
GLUCOSE: 149 mg/dL — AB (ref 65–99)
POTASSIUM: 4.9 mmol/L (ref 3.5–5.1)
Sodium: 138 mmol/L (ref 135–145)

## 2015-08-12 LAB — CBC WITH DIFFERENTIAL/PLATELET
BASOS ABS: 0 10*3/uL (ref 0.0–0.1)
Basophils Relative: 0 %
Eosinophils Absolute: 0.2 10*3/uL (ref 0.0–0.7)
Eosinophils Relative: 3 %
HEMATOCRIT: 32.8 % — AB (ref 36.0–46.0)
HEMOGLOBIN: 10.7 g/dL — AB (ref 12.0–15.0)
LYMPHS PCT: 21 %
Lymphs Abs: 1.4 10*3/uL (ref 0.7–4.0)
MCH: 31.5 pg (ref 26.0–34.0)
MCHC: 32.6 g/dL (ref 30.0–36.0)
MCV: 96.5 fL (ref 78.0–100.0)
MONO ABS: 0.4 10*3/uL (ref 0.1–1.0)
Monocytes Relative: 7 %
NEUTROS ABS: 4.5 10*3/uL (ref 1.7–7.7)
Neutrophils Relative %: 69 %
Platelets: 187 10*3/uL (ref 150–400)
RBC: 3.4 MIL/uL — AB (ref 3.87–5.11)
RDW: 14.5 % (ref 11.5–15.5)
WBC: 6.5 10*3/uL (ref 4.0–10.5)

## 2015-08-12 LAB — I-STAT TROPONIN, ED: Troponin i, poc: 0.03 ng/mL (ref 0.00–0.08)

## 2015-08-12 LAB — POC OCCULT BLOOD, ED: Fecal Occult Bld: NEGATIVE

## 2015-08-12 MED ORDER — SODIUM CHLORIDE 0.9 % IV BOLUS (SEPSIS)
500.0000 mL | Freq: Once | INTRAVENOUS | Status: AC
  Administered 2015-08-12: 500 mL via INTRAVENOUS

## 2015-08-12 NOTE — ED Provider Notes (Signed)
CSN: [id number]     Arrival date & time 08/12/15  1342 History   First MD Initiated Contact with Patient 08/12/15 1404     Chief Complaint  Patient presents with  . Near Syncope   LEVEL 5 CAVEAT DUE TO DEMENTIA  (Consider location/radiation/quality/duration/timing/severity/associated sxs/prior Treatment) HPI  82 year old female presents from home with syncope. History is limited as the patient has Alzheimer's and does not remember what happened. Per the daughter and EMS the patient passed out while sitting in a chair. Daughter states this is very similar to an episode she had February where she was admitted to the hospital. Patient did not have any specific complaints. She appeared dazed and then passed out. The nurse aide at the bedside lowered her down to the ground but she did not fall or hit her head. No complaints of chest pain. Patient never complained of palpitations. Currently she feels normal. Patient has had orthostasis for quite some time. Patient's heart rate is chronically in the 50s and 60s based on her metoprolol. Has had a small amount of bright red blood with BMs over past 1 week. Patient is acting normal at this time.   Past Medical History  Diagnosis Date  . Memory loss   . Palpitations   . Paroxysmal tachycardia, unspecified (HCC)   . Arthritis   . Urinary incontinence   . Rheumatoid arthritis (HCC)   . Tachycardia   . Abnormality of gait   . Other persistent mental disorders due to conditions classified elsewhere    History reviewed. No pertinent past surgical history. History reviewed. No pertinent family history. Social History  Substance Use Topics  . Smoking status: Never Smoker   . Smokeless tobacco: Never Used  . Alcohol Use: No   OB History    No data available     Review of Systems  Unable to perform ROS: Dementia      Allergies  Sulfa antibiotics  Home Medications   Prior to Admission medications   Medication Sig Start Date End Date  Taking? Authorizing Provider  Adalimumab (HUMIRA [place]) Inject 40 mg into the skin every 14 (fourteen) days.    Historical Provider, MD  Ascorbic Acid (VITAMIN C) 1000 MG tablet Take 1,000 mg by mouth daily.    Historical Provider, MD  aspirin 81 MG tablet Take 81 mg by mouth daily.    Historical Provider, MD  celecoxib (CELEBREX) 200 MG capsule Take 200 mg by mouth 2 (two) times daily.     Historical Provider, MD  Cholecalciferol (VITAMIN D3) 2000 UNITS TABS Take 5,000 Units by mouth daily.     Historical Provider, MD  folic acid (FOLVITE) 800 MCG tablet Take 400 mcg by mouth daily.    Historical Provider, MD  gabapentin (NEURONTIN) 100 MG capsule Take 100 mg by mouth every evening.    Historical Provider, MD  hydroxypropyl methylcellulose / hypromellose (ISOPTO TEARS / GONIOVISC) 2.5 % ophthalmic solution Place 1 drop into both eyes 2 (two) times daily.    Historical Provider, MD  Memantine HCl ER (NAMENDA XR) 28 MG CP24 Take 28 mg by mouth daily. 08/05/13   Levert Feinstein, MD  methotrexate (RHEUMATREX) 2.5 MG tablet Take 8 mg by mouth once a week. Sundays 05/16/15   Historical Provider, MD  metoprolol tartrate (LOPRESSOR) 25 MG tablet TAKING 1.5 TABLETS DAILY Patient taking differently: TAKING 1.5 TABLET (37.5mg ) DAILY 08/25/14   Lyn Records, MD  Multiple Vitamin (MULTIVITAMIN) capsule Take 1 capsule by mouth daily.  Historical Provider, MD  oxybutynin (DITROPAN) 5 MG tablet Take 5 mg by mouth 2 (two) times daily.     Historical Provider, MD  Zinc 40 MG TABS Take 80 mg by mouth daily.    Historical Provider, MD   BP 189/102 mmHg  Pulse 53  Temp(Src) 97.8 F (36.6 C) (Oral)  Resp 18  Ht 5\' 6"  (1.676 m)  Wt 175 lb (79.379 kg)  BMI 28.26 kg/m2  SpO2 99% Physical Exam  Constitutional: She appears well-developed and well-nourished.  HENT:  Head: Normocephalic and atraumatic.  Right Ear: External ear normal.  Left Ear: External ear normal.  Nose: Nose normal.  Eyes: EOM are normal. Pupils are  equal, round, and reactive to light. Right eye exhibits no discharge. Left eye exhibits no discharge.  Cardiovascular: Regular rhythm and normal heart sounds.  Bradycardia present.   HR in 50s/60s  Pulmonary/Chest: Effort normal and breath sounds normal.  Abdominal: Soft. There is no tenderness.  Genitourinary: Guaiac negative stool.  Nonthrombosed hemorrhoids vs skin tags on rectal exam Brown stool on finger during rectal  Neurological: She is alert. She is disoriented.  5/5 strength in all 4 extremities. Grossly normal sensation. Alert, confused (baseline). No facial droop  Skin: Skin is warm and dry.  Nursing note and vitals reviewed.   ED Course  Procedures (including critical care time) Labs Review Labs Reviewed  BASIC METABOLIC PANEL - Abnormal; Notable for the following:    Glucose, Bld 149 (*)    BUN 32 (*)    Creatinine, Ser 1.84 (*)    GFR calc non Af Amer 23 (*)    GFR calc Af Amer 27 (*)    All other components within normal limits  CBC WITH DIFFERENTIAL/PLATELET - Abnormal; Notable for the following:    RBC 3.40 (*)    Hemoglobin 10.7 (*)    HCT 32.8 (*)    All other components within normal limits  POC OCCULT BLOOD, ED  I-STAT TROPOININ, ED    Imaging Review No results found. I have personally reviewed and evaluated these images and lab results as part of my medical decision-making.   EKG Interpretation   Date/Time:  Thursday August 12 2015 13:55:15 EDT Ventricular Rate:  58 PR Interval:  153 QRS Duration: 92 QT Interval:  431 QTC Calculation: 423 R Axis:   4 Text Interpretation:  Sinus rhythm Abnormal R-wave progression, early  transition otherwise no significant change since Feb 2017 Confirmed by  Mar 2017  MD, Keona Bilyeu ([id number]) on 08/12/2015 2:22:43 PM      MDM   Final diagnoses:  Syncope, unspecified syncope type    Patient with syncope at rest with unknown cause. Similar to an episode that happened in February. At that time they determined it  might of been a blood pressure medicine issue. She is acting normal now. Heart rate is in the 50s but this is normal for her. Blood pressure is hypertensive. While she was orthostatic with EMS she was also orthostatic earlier in the day when family was checking and she had no symptoms. She will be given a little bit of fluids given mild increasing creatinine from baseline (1.6 to 1.8). Discussed options with daughter, she really wants to take patient home. She understands that there is risk that this could've been caused by an arrhythmia which if this occurs again could lead to a repeat syncope or possibly cardiac arrest. She understands this but still like to go home and follow-up with her cardiologist as an  outpatient. As for the rectal bleeding her hemoglobin is 10 which is higher than what it was back in February. No bleeding on exam. It sounds like there is only a small amount that could be from hemorrhoids. She has a gastroenterologist, encouraged to follow-up for a colonoscopy. Discussed if this occurs again she needs to return immediately.    Pricilla Loveless, MD 08/12/15 1556

## 2015-08-12 NOTE — ED Notes (Signed)
Pt CBG 160. 

## 2015-08-12 NOTE — ED Notes (Signed)
Pt is from home where she lives with two daughters.  Pt began to be "dazed".  Pt stated, "I am getting ready to die".  CNA who was at the home at the time, laid Pt on floor and checked BP which was below 100 systolic.  When EMS arrived, they stood her up and BP was 88/55.  Pt had amlodipine DC'd recently and is still taking Metoprolol.  Pt began to act like herself again and then had a large BM that had a scant amount of blood on it.

## 2015-08-12 NOTE — ED Notes (Signed)
Lab bedside.

## 2016-01-03 ENCOUNTER — Observation Stay (HOSPITAL_COMMUNITY)
Admission: EM | Admit: 2016-01-03 | Discharge: 2016-01-04 | Disposition: A | Discharge status: Home / Self Care | Payer: Medicare Other | Attending: Internal Medicine | Admitting: Internal Medicine

## 2016-01-03 ENCOUNTER — Encounter (HOSPITAL_COMMUNITY): Payer: Self-pay | Admitting: Emergency Medicine

## 2016-01-03 DIAGNOSIS — F0390 Unspecified dementia without behavioral disturbance: Secondary | ICD-10-CM | POA: Insufficient documentation

## 2016-01-03 DIAGNOSIS — N184 Chronic kidney disease, stage 4 (severe): Secondary | ICD-10-CM | POA: Insufficient documentation

## 2016-01-03 DIAGNOSIS — E860 Dehydration: Secondary | ICD-10-CM | POA: Diagnosis not present

## 2016-01-03 DIAGNOSIS — M069 Rheumatoid arthritis, unspecified: Secondary | ICD-10-CM | POA: Diagnosis present

## 2016-01-03 DIAGNOSIS — R55 Syncope and collapse: Secondary | ICD-10-CM | POA: Diagnosis present

## 2016-01-03 DIAGNOSIS — Z791 Long term (current) use of non-steroidal anti-inflammatories (NSAID): Secondary | ICD-10-CM | POA: Insufficient documentation

## 2016-01-03 DIAGNOSIS — I10 Essential (primary) hypertension: Secondary | ICD-10-CM | POA: Diagnosis present

## 2016-01-03 DIAGNOSIS — E875 Hyperkalemia: Secondary | ICD-10-CM | POA: Diagnosis not present

## 2016-01-03 DIAGNOSIS — N179 Acute kidney failure, unspecified: Secondary | ICD-10-CM | POA: Insufficient documentation

## 2016-01-03 DIAGNOSIS — Z79899 Other long term (current) drug therapy: Secondary | ICD-10-CM | POA: Diagnosis not present

## 2016-01-03 DIAGNOSIS — Z7982 Long term (current) use of aspirin: Secondary | ICD-10-CM | POA: Insufficient documentation

## 2016-01-03 DIAGNOSIS — I495 Sick sinus syndrome: Secondary | ICD-10-CM | POA: Diagnosis not present

## 2016-01-03 DIAGNOSIS — N19 Unspecified kidney failure: Secondary | ICD-10-CM

## 2016-01-03 DIAGNOSIS — I129 Hypertensive chronic kidney disease with stage 1 through stage 4 chronic kidney disease, or unspecified chronic kidney disease: Secondary | ICD-10-CM | POA: Insufficient documentation

## 2016-01-03 DIAGNOSIS — N189 Chronic kidney disease, unspecified: Secondary | ICD-10-CM

## 2016-01-03 DIAGNOSIS — R778 Other specified abnormalities of plasma proteins: Secondary | ICD-10-CM | POA: Insufficient documentation

## 2016-01-03 DIAGNOSIS — K5900 Constipation, unspecified: Secondary | ICD-10-CM | POA: Diagnosis not present

## 2016-01-03 LAB — CBC
HEMATOCRIT: 33.7 % — AB (ref 36.0–46.0)
HEMOGLOBIN: 10.9 g/dL — AB (ref 12.0–15.0)
MCH: 32.2 pg (ref 26.0–34.0)
MCHC: 32.3 g/dL (ref 30.0–36.0)
MCV: 99.4 fL (ref 78.0–100.0)
Platelets: 195 10*3/uL (ref 150–400)
RBC: 3.39 MIL/uL — ABNORMAL LOW (ref 3.87–5.11)
RDW: 14.5 % (ref 11.5–15.5)
WBC: 8 10*3/uL (ref 4.0–10.5)

## 2016-01-03 LAB — BASIC METABOLIC PANEL
ANION GAP: 9 (ref 5–15)
BUN: 36 mg/dL — ABNORMAL HIGH (ref 6–20)
CALCIUM: 8.9 mg/dL (ref 8.9–10.3)
CHLORIDE: 107 mmol/L (ref 101–111)
CO2: 23 mmol/L (ref 22–32)
Creatinine, Ser: 2.58 mg/dL — ABNORMAL HIGH (ref 0.44–1.00)
GFR, EST AFRICAN AMERICAN: 18 mL/min — AB (ref >60)
GFR, EST NON AFRICAN AMERICAN: 16 mL/min — AB (ref >60)
GLUCOSE: 153 mg/dL — AB (ref 65–99)
POTASSIUM: 5.3 mmol/L — AB (ref 3.5–5.1)
Sodium: 139 mmol/L (ref 135–145)

## 2016-01-03 LAB — URINALYSIS, ROUTINE W REFLEX MICROSCOPIC
Bilirubin Urine: NEGATIVE
GLUCOSE, UA: NEGATIVE mg/dL
Hgb urine dipstick: NEGATIVE
Ketones, ur: NEGATIVE mg/dL
LEUKOCYTES UA: NEGATIVE
NITRITE: NEGATIVE
PH: 5.5 (ref 5.0–8.0)
Protein, ur: 30 mg/dL — AB
SPECIFIC GRAVITY, URINE: 1.025 (ref 1.005–1.030)

## 2016-01-03 LAB — CBG MONITORING, ED: Glucose-Capillary: 170 mg/dL — ABNORMAL HIGH (ref 65–99)

## 2016-01-03 LAB — URINE MICROSCOPIC-ADD ON
RBC / HPF: NONE SEEN RBC/hpf (ref 0–5)
Squamous Epithelial / LPF: NONE SEEN

## 2016-01-03 LAB — TROPONIN I: Troponin I: 0.26 ng/mL (ref <0.03)

## 2016-01-03 MED ORDER — SODIUM POLYSTYRENE SULFONATE 15 GM/60ML PO SUSP
45.0000 g | Freq: Once | ORAL | Status: AC
Start: 2016-01-03 — End: 2016-01-03
  Administered 2016-01-03: 45 g via ORAL
  Filled 2016-01-03: qty 180

## 2016-01-03 MED ORDER — SODIUM CHLORIDE 0.9 % IV BOLUS (SEPSIS)
1000.0000 mL | Freq: Once | INTRAVENOUS | Status: AC
  Administered 2016-01-03: 1000 mL via INTRAVENOUS

## 2016-01-03 NOTE — ED Provider Notes (Signed)
[facility]-EMERGENCY DEPT Provider Note   CSN: [id number] Arrival date & time: 01/03/16  1558     History   Chief Complaint Chief Complaint  Patient presents with  . Loss of Consciousness    HPI Darlene Daniels is a 82 y.o. female.  HPI  A LEVEL 5 CAVEAT PERTAINS DUE TO DEMENTIA Pt presenting with c/o syncope.  She was using the bathroom and straining to pass a bowel movement, then had syncope.  No chest pain, no headache, no seizure activity.  She did have low heart rate and low blood pressure at the time of syncope.  Currently she is back to her baseline.  She has been eating and drinking normally.  She currently has no complaints.    Past Medical History:  Diagnosis Date  . Abnormality of gait   . Arthritis   . Memory loss   . Other persistent mental disorders due to conditions classified elsewhere   . Palpitations   . Paroxysmal tachycardia, unspecified (HCC)   . Rheumatoid arthritis (HCC)   . Tachycardia   . Urinary incontinence     Patient Active Problem List   Diagnosis Date Noted  . Dementia 01/04/2016  . Acute on chronic renal failure (HCC) 01/03/2016  . Orthostatic hypotension 05/30/2015  . Macrocytic anemia 05/30/2015  . AKI (acute kidney injury) (HCC) 05/30/2015  . Syncope and collapse 05/30/2015  . Essential hypertension, benign 03/10/2013  . Tachy-brady syndrome (HCC) 03/10/2013    Class: Chronic  . Memory loss   . Palpitations   . Paroxysmal tachycardia, unspecified (HCC)   . Arthritis   . Urinary incontinence   . Rheumatoid arthritis (HCC)   . Tachycardia   . 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 Abnormality of gait 12/27/2012    History reviewed. No pertinent surgical history.  OB History    No data available       Home Medications    Prior to Admission medications   Medication Sig Start Date End Date Taking? Authorizing Provider  Adalimumab (HUMIRA) 40 MG/0.8ML PSKT Inject 40 mg into the  skin every 14 (fourteen) days.   Yes Historical Provider, MD  Ascorbic Acid (VITAMIN C) 1000 MG tablet Take 1,000 mg by mouth daily.   Yes Historical Provider, MD  aspirin 81 MG tablet Take 81 mg by mouth daily.   Yes Historical Provider, MD  b complex vitamins tablet Take 1 tablet by mouth daily.   Yes Historical Provider, MD  celecoxib (CELEBREX) 200 MG capsule Take 200 mg by mouth 2 (two) times daily.    Yes Historical Provider, MD  Cholecalciferol (VITAMIN D3) 5000 units TABS Take 1 tablet by mouth daily.   Yes Historical Provider, MD  ferrous sulfate 325 (65 FE) MG tablet Take 325 mg by mouth daily.   Yes Historical Provider, MD  folic acid (FOLVITE) 800 MCG tablet Take 800 mcg by mouth daily.    Yes Historical Provider, MD  gabapentin (NEURONTIN) 100 MG capsule Take 100 mg by mouth every evening.   Yes Historical Provider, MD  hydrALAZINE (APRESOLINE) 25 MG tablet Take 25 mg by mouth 2 (two) times daily. 12/13/15  Yes Historical Provider, MD  hydroxypropyl methylcellulose / hypromellose (ISOPTO TEARS / GONIOVISC) 2.5 % ophthalmic solution Place 1 drop into both eyes 2 (two) times daily.   Yes Historical Provider, MD  latanoprost (XALATAN) 0.005 % ophthalmic solution Place 1 drop into the left eye at bedtime.   Yes Historical Provider, MD  megestrol (MEGACE) 40 MG/ML suspension Take 400 mg by mouth every evening.   Yes Historical Provider, MD  Memantine HCl ER (NAMENDA XR) 28 MG CP24 Take 28 mg by mouth daily. 08/05/13  Yes Levert Feinstein, MD  metoprolol tartrate (LOPRESSOR) 25 MG tablet TAKING 1.5 TABLETS DAILY Patient taking differently: TAKING 1.5 TABLET (37.5mg ) DAILY 08/25/14  Yes Lyn Records, MD  Multiple Vitamin (MULTIVITAMIN) capsule Take 1 capsule by mouth daily.   Yes Historical Provider, MD  oxybutynin (DITROPAN) 5 MG tablet Take 5 mg by mouth 2 (two) times daily.    Yes Historical Provider, MD  Zinc 40 MG TABS Take 80 mg by mouth daily.   Yes Historical Provider, MD  bisacodyl  (BISACODYL) 5 MG EC tablet Take 1-2 tablets (5-10 mg total) by mouth daily as needed for moderate constipation. 01/04/16   Calvert Cantor, MD  senna (SENOKOT) 8.6 MG TABS tablet Take 1-2 tablets (8.6-17.2 mg total) by mouth at bedtime. 01/04/16   Calvert Cantor, MD    Family History Family History  Problem Relation Age of Onset  . Breast cancer Daughter   . Diabetes Daughter     Social History Social History  Substance Use Topics  . Smoking status: Never Smoker  . Smokeless tobacco: Never Used  . Alcohol use No     Allergies   Sulfa antibiotics   Review of Systems Review of Systems UNABLE TO OBTAIN ROS DUE TO LEVEL 5 CAVEAT  Physical Exam Updated Vital Signs BP (!) 152/104 (BP Location: Right Arm)   Pulse 74   Temp 98.1 F (36.7 C) (Oral)   Resp 18   Ht 5\' 9"  (1.753 m)   Wt 77.6 kg   SpO2 99%   BMI 25.25 kg/m  Vitals reviewed Physical Exam Physical Examination: General appearance - alert, well appearing, and in no distress Mental status - alert, oriented to person, place, and time Eyes - no conjunctival injection, no scleral icterus Mouth - mucous membranes moist, pharynx normal without lesions Chest - clear to auscultation, no wheezes, rales or rhonchi, symmetric air entry Heart - normal rate, regular rhythm, normal S1, S2, no murmurs, rubs, clicks or gallops Abdomen - soft, nontender, nondistended, no masses or organomegaly Neurological - alert, oriented x 2, normal speech Extremities - peripheral pulses normal, no pedal edema, no clubbing or cyanosis Skin - normal coloration and turgor, no rashes  ED Treatments / Results  Labs (all labs ordered are listed, but only abnormal results are displayed) Labs Reviewed  BASIC METABOLIC PANEL - Abnormal; Notable for the following:       Result Value   Potassium 5.3 (*)    Glucose, Bld 153 (*)    BUN 36 (*)    Creatinine, Ser 2.58 (*)    GFR calc non Af Amer 16 (*)    GFR calc Af Amer 18 (*)    All other components  within normal limits  CBC - Abnormal; Notable for the following:    RBC 3.39 (*)    Hemoglobin 10.9 (*)    HCT 33.7 (*)    All other components within normal limits  URINALYSIS, ROUTINE W REFLEX MICROSCOPIC (NOT AT [facility]) - Abnormal; Notable for the following:    Protein, ur 30 (*)    All other components within normal limits  URINE MICROSCOPIC-ADD ON - Abnormal; Notable for the following:    Bacteria, UA RARE (*)    Casts HYALINE CASTS (*)    All other components within normal limits  TROPONIN I - Abnormal; Notable for the following:    Troponin I 0.26 (*)    All other components within normal limits  TROPONIN I - Abnormal; Notable for the following:    Troponin I 0.32 (*)    All other components within normal limits  TROPONIN I - Abnormal; Notable for the following:    Troponin I 0.39 (*)    All other components within normal limits  TROPONIN I - Abnormal; Notable for the following:    Troponin I 0.30 (*)    All other components within normal limits  GLUCOSE, CAPILLARY - Abnormal; Notable for the following:    Glucose-Capillary 122 (*)    All other components within normal limits  BASIC METABOLIC PANEL - Abnormal; Notable for the following:    Glucose, Bld 112 (*)    BUN 26 (*)    Creatinine, Ser 1.98 (*)    Calcium 8.4 (*)    GFR calc non Af Amer 21 (*)    GFR calc Af Amer 25 (*)    All other components within normal limits  CBC - Abnormal; Notable for the following:    RBC 3.07 (*)    Hemoglobin 9.9 (*)    HCT 30.4 (*)    All other components within normal limits  CBG MONITORING, ED - Abnormal; Notable for the following:    Glucose-Capillary 170 (*)    All other components within normal limits  TSH  SODIUM, URINE, RANDOM  CREATININE, URINE, RANDOM    EKG  EKG Interpretation  Date/Time:  Monday January 03 2016 16:32:45 EDT Ventricular Rate:  53 PR Interval:    QRS Duration: 88 QT Interval:  446 QTC Calculation: 419 R Axis:   -14 Text Interpretation:   Sinus rhythm Atrial premature complex Abnormal R-wave progression, early transition Borderline repolarization abnormality No significant change since last tracing Confirmed by Karma Ganja  MD, MARTHA [phone]) on 01/03/2016 5:38:44 PM       Radiology No results found.  Procedures Procedures (including critical care time)  Medications Ordered in ED Medications  sodium chloride 0.9 % bolus 1,000 mL (0 mLs Intravenous Stopped 01/03/16 2043)  sodium polystyrene (KAYEXALATE) 15 GM/60ML suspension 45 g (45 g Oral Given 01/03/16 1845)     Initial Impression / Assessment and Plan / ED Course  I have reviewed the triage vital signs and the nursing notes.  Pertinent labs & imaging results that were available during my care of the patient were reviewed by me and considered in my medical decision making (see chart for details).  Clinical Course  10:34 PM d/w Dr. Adela Glimpse, hospitalist for admission- pt to go to telemetry bed.  Daughter is agreeable with plan.   Pt presenting after 2 syncopal episodes - these have occurred during bowel movements and sound vasovagal in nature- she is on metoprolol which may be contributing to bradycardia, she is also in renal failure- likely due to dehydration which could be precipitating syncope as well.  Hyperkalemia is mild without EKG changes, given kayexylate.  Pt to be admitted to triad.   Treated with IV fluds.   Final Clinical Impressions(s) / ED Diagnoses   Final diagnoses:  Syncope, unspecified syncope type  Renal failure  Hyperkalemia    New Prescriptions Discharge Medication List as of 01/04/2016  5:51 PM    START taking these medications   Details  bisacodyl (BISACODYL) 5 MG EC tablet Take 1-2 tablets (5-10 mg total) by mouth daily as needed for moderate constipation., Starting Tue  01/04/2016, Normal    senna (SENOKOT) 8.6 MG TABS tablet Take 1-2 tablets (8.6-17.2 mg total) by mouth at bedtime., Starting Tue 01/04/2016, Normal         Jerelyn Scott,  MD 01/07/16 [phone]

## 2016-01-03 NOTE — ED Notes (Signed)
Pt and family aware that a urine sample is needed.  Pt has a bedside commode at bedside.

## 2016-01-03 NOTE — ED Notes (Signed)
Bed: IY64 Expected date:  Expected time:  Means of arrival:  Comments: 35F/syncope

## 2016-01-03 NOTE — ED Triage Notes (Signed)
Per [facility] pt from home, care giver reports syncope while using restroom. Initial low BP with EMS  90/50. After fluid resuscitation 400 cc NS by  ems , BP increased to 138/80. Pt was responsive at the scene  Yet appears lethargic. Pass the stroke scale with EMS. No  Further neuro deficit . Alert and orienetd to self and situation upon arrival.

## 2016-01-03 NOTE — ED Notes (Signed)
Dr.Doutova at bedside to discuss plan of care. Pt on bedside commode at this time having bowel movement.

## 2016-01-03 NOTE — ED Notes (Signed)
EDP at bedside  

## 2016-01-03 NOTE — H&P (Signed)
Darlene Daniels:[id number] DOB: Aug 23, 1924 DOA: 01/03/2016     PCP: Alva Garnet., MD   Outpatient Specialists: Cardiology Katrinka Blazing,  Patient coming from:   home Lives   With family    Chief Complaint: Syncope   HPI: Darlene Daniels is a 82 y.o. female with medical history significant of HTN, recurrent syncope, tachybradycardia syndrome, rheumatoid arthritis, orthostatic hypotension  Dementia urinary incontinence  Presented with 2 episodes of syncope in the past 1 week all associated with having a bowel movement. Patient was straining hard to have a BM and went limp patient woke up easily after verbally stimulated. No seizure activity noted. Event lasted just a few minutes. Previous to prior but Today was witnessed by family in the called 911. On EMS arrival blood pressure low down to 90s/50patient was administered IV fluids 400 mL normal saline and blood pressure improved to 138/80. Patient appeared to be slightly lethargic no neurological deficits was noted lethargy improved with IV rehydration she has significant dementia M able to provide her own history. Per family she has 24-hour caretakers. The episodes occurred when patient was straining to pass a bowel movement she did not really report any chest pain or headache no seizure-like activity per caretakers of note family reports that lately she's been having low heart rate and blood pressure. Family thinks that she's been  struggling to be hydrated. Family reports constipation which they treated with aggressive bowel regimen patient had had 2 large bowel movements   Regarding pertinent Chronic problems:rheumatoid arthritis on Rheumatrex and Humira, patient has been admitted in the past in February for in keeping That time patient was admitted to telemetry and hydrated beta blocker due to dose was reduced she was noted to be orthostatic and was improved after rehydration and VQ scan done at that showed showed low probability echo was  done which showed normal EF  IN ER:  Temp (24hrs), Avg:97.4 F (36.3 C), Min:97.4 F (36.3 C), Max:97.4 F (36.3 C)    Heart rate 53 blood pressure 176/85   Sodium 139 potassium 5.3 patient was given Kayexalate BUN 36 creatinine 2.58 which is up from baseline 1.84  WBC 8.0 hemoglobin 10.9 which is her baseline  Following Medications were ordered in ER: Medications  sodium chloride 0.9 % bolus 1,000 mL (0 mLs Intravenous Stopped 01/03/16 2043)  sodium polystyrene (KAYEXALATE) 15 GM/60ML suspension 45 g (45 g Oral Given 01/03/16 1845)      Hospitalist was called for admission for Syncope  Review of Systems:  Patient unable to provide secondary to dementia Past Medical History: Past Medical History:  Diagnosis Date  . Abnormality of gait   . Arthritis   . Memory loss   . Other persistent mental disorders due to conditions classified elsewhere   . Palpitations   . Paroxysmal tachycardia, unspecified (HCC)   . Rheumatoid arthritis (HCC)   . Tachycardia   . Urinary incontinence    History reviewed. No pertinent surgical history.   Social History:  Ambulatory  walker       reports that she has never smoked. She has never used smokeless tobacco. She reports that she does not drink alcohol or use drugs.  Allergies:   Allergies  Allergen Reactions  . Sulfa Antibiotics     Unknown reaction        Family History:   Family History  Problem Relation Age of Onset  . Breast cancer Daughter   . Diabetes Daughter     Medications: Prior to  Admission medications   Medication Sig Start Date End Date Taking? Authorizing Provider  Adalimumab (HUMIRA) 40 MG/0.8ML PSKT Inject 40 mg into the skin every 14 (fourteen) days.   Yes Historical Provider, MD  Ascorbic Acid (VITAMIN C) 1000 MG tablet Take 1,000 mg by mouth daily.   Yes Historical Provider, MD  aspirin 81 MG tablet Take 81 mg by mouth daily.   Yes Historical Provider, MD  b complex vitamins tablet Take 1 tablet by  mouth daily.   Yes Historical Provider, MD  celecoxib (CELEBREX) 200 MG capsule Take 200 mg by mouth 2 (two) times daily.    Yes Historical Provider, MD  Cholecalciferol (VITAMIN D3) 5000 units TABS Take 1 tablet by mouth daily.   Yes Historical Provider, MD  ferrous sulfate 325 (65 FE) MG tablet Take 325 mg by mouth daily.   Yes Historical Provider, MD  folic acid (FOLVITE) 800 MCG tablet Take 800 mcg by mouth daily.    Yes Historical Provider, MD  gabapentin (NEURONTIN) 100 MG capsule Take 100 mg by mouth every evening.   Yes Historical Provider, MD  hydrALAZINE (APRESOLINE) 25 MG tablet Take 25 mg by mouth 2 (two) times daily. 12/13/15  Yes Historical Provider, MD  hydroxypropyl methylcellulose / hypromellose (ISOPTO TEARS / GONIOVISC) 2.5 % ophthalmic solution Place 1 drop into both eyes 2 (two) times daily.   Yes Historical Provider, MD  latanoprost (XALATAN) 0.005 % ophthalmic solution Place 1 drop into the left eye at bedtime.   Yes Historical Provider, MD  megestrol (MEGACE) 40 MG/ML suspension Take 400 mg by mouth every evening.   Yes Historical Provider, MD  Memantine HCl ER (NAMENDA XR) 28 MG CP24 Take 28 mg by mouth daily. 08/05/13  Yes Levert Feinstein, MD  metoprolol tartrate (LOPRESSOR) 25 MG tablet TAKING 1.5 TABLETS DAILY Patient taking differently: TAKING 1.5 TABLET (37.5mg ) DAILY 08/25/14  Yes Lyn Records, MD  Multiple Vitamin (MULTIVITAMIN) capsule Take 1 capsule by mouth daily.   Yes Historical Provider, MD  oxybutynin (DITROPAN) 5 MG tablet Take 5 mg by mouth 2 (two) times daily.    Yes Historical Provider, MD  Zinc 40 MG TABS Take 80 mg by mouth daily.   Yes Historical Provider, MD    Physical Exam: Patient Vitals for the past 24 hrs:  BP Temp Temp src Pulse Resp SpO2 Height Weight  01/03/16 2200 176/85 - - (!) 53 12 100 % - -  01/03/16 2130 172/81 - - (!) 58 13 97 % - -  01/03/16 1951 (!) 151/103 - - 63 12 99 % 5\' 9"  (1.753 m) 79.4 kg (175 lb)  01/03/16 1905 190/90 - - 60 16 99  % - -  01/03/16 1614 146/67 97.4 F (36.3 C) Oral (!) 55 16 96 % - -  01/03/16 1606 - - - - - 99 % - -    1. General:  in No Acute distress  2. Psychological: Alert but not Oriented To situation not fully following commands 3. Head/ENT:    Dry Mucous Membranes                          Head Non traumatic, neck supple                           Poor Dentition 4. SKIN:   decreased Skin turgor,  Skin clean Dry and intact no rash 5. Heart: Regular rate  and rhythm no  Murmur, Rub or gallop 6. Lungs:  Clear to auscultation bilaterally, no wheezes or crackles   7. Abdomen: Soft,   non-tender, Non distended 8. Lower extremities: no clubbing, cyanosis, or edema 9. Neurologically Grossly intact, moving all 4 extremities equally  10. MSK: Normal range of motion   body mass index is 25.84 kg/m.  Labs on Admission:   Labs on Admission: I have personally reviewed following labs and imaging studies  CBC:  Recent Labs Lab 01/03/16 1646  WBC 8.0  HGB 10.9*  HCT 33.7*  MCV 99.4  PLT 195   Basic Metabolic Panel:  Recent Labs Lab 01/03/16 1646  NA 139  K 5.3*  CL 107  CO2 23  GLUCOSE 153*  BUN 36*  CREATININE 2.58*  CALCIUM 8.9   GFR: Estimated Creatinine Clearance: 15.8 mL/min (by C-G formula based on SCr of 2.58 mg/dL). Liver Function Tests: No results for input(s): AST, ALT, ALKPHOS, BILITOT, PROT, ALBUMIN in the last 168 hours. No results for input(s): LIPASE, AMYLASE in the last 168 hours. No results for input(s): AMMONIA in the last 168 hours. Coagulation Profile: No results for input(s): INR, PROTIME in the last 168 hours. Cardiac Enzymes: No results for input(s): CKTOTAL, CKMB, CKMBINDEX, TROPONINI in the last 168 hours. BNP (last 3 results) No results for input(s): PROBNP in the last 8760 hours. HbA1C: No results for input(s): HGBA1C in the last 72 hours. CBG:  Recent Labs Lab 01/03/16 1628  GLUCAP 170*   Lipid Profile: No results for input(s): CHOL, HDL,  LDLCALC, TRIG, CHOLHDL, LDLDIRECT in the last 72 hours. Thyroid Function Tests: No results for input(s): TSH, T4TOTAL, FREET4, T3FREE, THYROIDAB in the last 72 hours. Anemia Panel: No results for input(s): VITAMINB12, FOLATE, FERRITIN, TIBC, IRON, RETICCTPCT in the last 72 hours. Urine analysis:    Component Value Date/Time   COLORURINE YELLOW 01/03/2016 1600   APPEARANCEUR CLEAR 01/03/2016 1600   LABSPEC 1.025 01/03/2016 1600   PHURINE 5.5 01/03/2016 1600   GLUCOSEU NEGATIVE 01/03/2016 1600   HGBUR NEGATIVE 01/03/2016 1600   BILIRUBINUR NEGATIVE 01/03/2016 1600   KETONESUR NEGATIVE 01/03/2016 1600   PROTEINUR 30 (A) 01/03/2016 1600   NITRITE NEGATIVE 01/03/2016 1600   LEUKOCYTESUR NEGATIVE 01/03/2016 1600   Sepsis Labs: @LABRCNTIP (procalcitonin:4,lacticidven:4) )No results found for this or any previous visit (from the past 240 hour(s)).     UA   no evidence of UTI   Lab Results  Component Value Date   HGBA1C 6.3 (H) 05/30/2015    Estimated Creatinine Clearance: 15.8 mL/min (by C-G formula based on SCr of 2.58 mg/dL).  BNP (last 3 results) No results for input(s): PROBNP in the last 8760 hours.   ECG REPORT  Independently reviewed Rate:56  Rhythm: Sinus bradycardia ST&T Change:Flat T waves throughout QTC 419  Filed Weights   01/03/16 1951  Weight: 79.4 kg (175 lb)     Cultures: No results found for: SDES, SPECREQUEST, CULT, REPTSTATUS   Radiological Exams on Admission: No results found.  Chart has been reviewed    Assessment/Plan  82 y.o. female with medical history significant of HTN, recurrent syncope, tachybradycardia syndrome, rheumatoid arthritis, orthostatic hypotension  Dementia urinary incontinence   admitted for recurrent syncope  Present on Admission: . Syncope and collapse  orthostatics negative in emergency department, consistent with vasovagal syncope in a setting of  bowel movement, will decrease further dose of metoprolol.   Patient  will likely benefit from some permissive hypertension. for completion will obtain cardiac markers,  repeat echo, obtain carotid Dopplers would benefit from cardiology follow-up . Essen tial hypertension, benign - adjust home medications  . Tachy-brady syndrome (HCC)- will decrease metoprolol monitor on telemetry for any arrhythmias . Acute on chronic renal failure (HCC)  - will rehydrate obtain urine electrolytes    elevated potassium will monitor on telemetry patient has received Kayexalate in emergency department already had above large bowel movement   Other plan as per orders.  DVT prophylaxis:    Lovenox     Code Status:  FULL CODE as per family   Family Communication:   Family  at  Bedside  plan of care was discussed with Daughter  Disposition Plan:    To home once workup is complete and patient is stable                               Consults called: emailed cardiology  Admission status:  obs   Level of care    tele         I have spent a total of 57 min on this admission  Lorin Gawron 01/03/2016, 11:13 PM    [facility]  Pager [phone]   after 2 AM please page floor coverage PA If 7AM-7PM, please contact the day team taking care of the patient  [email or web address]  Password [email or web address]

## 2016-01-04 ENCOUNTER — Observation Stay (HOSPITAL_BASED_OUTPATIENT_CLINIC_OR_DEPARTMENT_OTHER): Payer: Medicare Other

## 2016-01-04 ENCOUNTER — Observation Stay (HOSPITAL_COMMUNITY): Payer: Medicare Other

## 2016-01-04 DIAGNOSIS — N179 Acute kidney failure, unspecified: Secondary | ICD-10-CM | POA: Diagnosis not present

## 2016-01-04 DIAGNOSIS — I10 Essential (primary) hypertension: Secondary | ICD-10-CM

## 2016-01-04 DIAGNOSIS — R55 Syncope and collapse: Secondary | ICD-10-CM

## 2016-01-04 DIAGNOSIS — F0390 Unspecified dementia without behavioral disturbance: Secondary | ICD-10-CM

## 2016-01-04 DIAGNOSIS — N189 Chronic kidney disease, unspecified: Secondary | ICD-10-CM

## 2016-01-04 DIAGNOSIS — M069 Rheumatoid arthritis, unspecified: Secondary | ICD-10-CM

## 2016-01-04 DIAGNOSIS — I495 Sick sinus syndrome: Secondary | ICD-10-CM

## 2016-01-04 LAB — BASIC METABOLIC PANEL
Anion gap: 8 (ref 5–15)
BUN: 26 mg/dL — ABNORMAL HIGH (ref 6–20)
CALCIUM: 8.4 mg/dL — AB (ref 8.9–10.3)
CO2: 23 mmol/L (ref 22–32)
CREATININE: 1.98 mg/dL — AB (ref 0.44–1.00)
Chloride: 109 mmol/L (ref 101–111)
GFR calc Af Amer: 25 mL/min — ABNORMAL LOW (ref >60)
GFR calc non Af Amer: 21 mL/min — ABNORMAL LOW (ref >60)
GLUCOSE: 112 mg/dL — AB (ref 65–99)
Potassium: 4.3 mmol/L (ref 3.5–5.1)
Sodium: 140 mmol/L (ref 135–145)

## 2016-01-04 LAB — CBC
HCT: 30.4 % — ABNORMAL LOW (ref 36.0–46.0)
Hemoglobin: 9.9 g/dL — ABNORMAL LOW (ref 12.0–15.0)
MCH: 32.2 pg (ref 26.0–34.0)
MCHC: 32.6 g/dL (ref 30.0–36.0)
MCV: 99 fL (ref 78.0–100.0)
PLATELETS: 190 10*3/uL (ref 150–400)
RBC: 3.07 MIL/uL — AB (ref 3.87–5.11)
RDW: 14.5 % (ref 11.5–15.5)
WBC: 7.3 10*3/uL (ref 4.0–10.5)

## 2016-01-04 LAB — VAS US CAROTID
LCCADSYS: -40 cm/s
LCCAPDIAS: 16 cm/s
LEFT ECA DIAS: -11 cm/s
LEFT VERTEBRAL DIAS: 14 cm/s
LICAPDIAS: 11 cm/s
LICAPSYS: 42 cm/s
Left CCA dist dias: -11 cm/s
Left CCA prox sys: 76 cm/s
RCCAPSYS: 70 cm/s
RIGHT ECA DIAS: -14 cm/s
RIGHT VERTEBRAL DIAS: 8 cm/s
Right CCA prox dias: 16 cm/s
Right cca dist sys: -79 cm/s

## 2016-01-04 LAB — ECHOCARDIOGRAM COMPLETE
Height: 69 in
WEIGHTICAEL: 2736 [oz_av]

## 2016-01-04 LAB — TSH: TSH: 0.557 u[IU]/mL (ref 0.350–4.500)

## 2016-01-04 LAB — CREATININE, URINE, RANDOM: CREATININE, URINE: 206.6 mg/dL

## 2016-01-04 LAB — TROPONIN I
TROPONIN I: 0.39 ng/mL — AB (ref <0.03)
TROPONIN I: 0.3 ng/mL — AB (ref <0.03)
Troponin I: 0.32 ng/mL (ref <0.03)

## 2016-01-04 LAB — SODIUM, URINE, RANDOM: SODIUM UR: 123 mmol/L

## 2016-01-04 LAB — GLUCOSE, CAPILLARY: Glucose-Capillary: 122 mg/dL — ABNORMAL HIGH (ref 65–99)

## 2016-01-04 MED ORDER — MEGESTROL ACETATE 40 MG/ML PO SUSP
400.0000 mg | Freq: Every evening | ORAL | Status: DC
  Administered 2016-01-04: 400 mg via ORAL
  Filled 2016-01-04 (×2): qty 10

## 2016-01-04 MED ORDER — METOPROLOL TARTRATE 25 MG PO TABS
12.5000 mg | ORAL_TABLET | Freq: Two times a day (BID) | ORAL | Status: DC
  Administered 2016-01-04 (×2): 12.5 mg via ORAL
  Filled 2016-01-04 (×2): qty 1

## 2016-01-04 MED ORDER — BISACODYL 5 MG PO TBEC: 5.0000 mg | DELAYED_RELEASE_TABLET | Freq: Every day | ORAL | 0 refills | Status: DC | PRN

## 2016-01-04 MED ORDER — LATANOPROST 0.005 % OP SOLN
1.0000 [drp] | Freq: Every day | OPHTHALMIC | Status: DC
  Administered 2016-01-04: 1 [drp] via OPHTHALMIC
  Filled 2016-01-04: qty 2.5

## 2016-01-04 MED ORDER — HYDRALAZINE HCL 25 MG PO TABS
25.0000 mg | ORAL_TABLET | Freq: Two times a day (BID) | ORAL | Status: DC
  Administered 2016-01-04 (×2): 25 mg via ORAL
  Filled 2016-01-04 (×2): qty 1

## 2016-01-04 MED ORDER — HYDRALAZINE HCL 50 MG PO TABS
50.0000 mg | ORAL_TABLET | Freq: Three times a day (TID) | ORAL | Status: DC
  Administered 2016-01-04: 50 mg via ORAL
  Filled 2016-01-04: qty 1

## 2016-01-04 MED ORDER — SODIUM CHLORIDE 0.9 % IV SOLN
INTRAVENOUS | Status: DC
  Administered 2016-01-04: 01:00:00 via INTRAVENOUS

## 2016-01-04 MED ORDER — OXYBUTYNIN CHLORIDE 5 MG PO TABS
5.0000 mg | ORAL_TABLET | Freq: Two times a day (BID) | ORAL | Status: DC
  Administered 2016-01-04 (×2): 5 mg via ORAL
  Filled 2016-01-04 (×2): qty 1

## 2016-01-04 MED ORDER — HYPROMELLOSE (GONIOSCOPIC) 2.5 % OP SOLN: 1.0000 [drp] | Freq: Two times a day (BID) | OPHTHALMIC | Status: DC

## 2016-01-04 MED ORDER — SODIUM CHLORIDE 0.9% FLUSH
3.0000 mL | Freq: Two times a day (BID) | INTRAVENOUS | Status: DC
  Administered 2016-01-04: 3 mL via INTRAVENOUS

## 2016-01-04 MED ORDER — MEMANTINE HCL ER 28 MG PO CP24
28.0000 mg | ORAL_CAPSULE | Freq: Every day | ORAL | Status: DC
  Administered 2016-01-04: 28 mg via ORAL
  Filled 2016-01-04 (×2): qty 1

## 2016-01-04 MED ORDER — SENNA 8.6 MG PO TABS: 1.0000 | ORAL_TABLET | Freq: Every day | ORAL | 0 refills | Status: AC

## 2016-01-04 MED ORDER — ASPIRIN 81 MG PO CHEW
81.0000 mg | CHEWABLE_TABLET | Freq: Every day | ORAL | Status: DC
Start: 2016-01-04 — End: 2016-01-04
  Administered 2016-01-04: 81 mg via ORAL
  Filled 2016-01-04: qty 1

## 2016-01-04 MED ORDER — POLYVINYL ALCOHOL 1.4 % OP SOLN
1.0000 [drp] | Freq: Two times a day (BID) | OPHTHALMIC | Status: DC
  Administered 2016-01-04 (×2): 1 [drp] via OPHTHALMIC
  Filled 2016-01-04: qty 15

## 2016-01-04 MED ORDER — ENOXAPARIN SODIUM 30 MG/0.3ML ~~LOC~~ SOLN
30.0000 mg | SUBCUTANEOUS | Status: DC
  Administered 2016-01-04: 30 mg via SUBCUTANEOUS
  Filled 2016-01-04: qty 0.3

## 2016-01-04 MED ORDER — GABAPENTIN 100 MG PO CAPS
100.0000 mg | ORAL_CAPSULE | Freq: Every evening | ORAL | Status: DC
  Administered 2016-01-04 (×2): 100 mg via ORAL
  Filled 2016-01-04 (×2): qty 1

## 2016-01-04 NOTE — Evaluation (Signed)
Clinical/Bedside Swallow Evaluation Patient Details  Name: Darlene Daniels MRN: [id number] Date of Birth: 1925/04/14  Today's Date: 01/04/2016 Time: SLP Start Time (ACUTE ONLY): 1033 SLP Stop Time (ACUTE ONLY): 1100 SLP Time Calculation (min) (ACUTE ONLY): 27 min  Past Medical History:  Past Medical History:  Diagnosis Date  . Abnormality of gait   . Arthritis   . Memory loss   . Other persistent mental disorders due to conditions classified elsewhere   . Palpitations   . Paroxysmal tachycardia, unspecified (HCC)   . Rheumatoid arthritis (HCC)   . Tachycardia   . Urinary incontinence    Past Surgical History: History reviewed. No pertinent surgical history. HPI:  82 yo female adm to [facility] after having 2 syncopal episodes during bowel movements in one week. Pt has h/o dementia, RA, memory problem.  She has a caregiver of 4 years present during SLP visit.   Swallow evaluation ordered upon admit.     Assessment / Plan / Recommendation Clinical Impression  Pt presents with mild oral phase dysphagia suspected due to her cognitive deficits/lethargy and poor dentition.   Pt dysphagia characterized by prolonged mastication (up to 4 minutes with single bolus of cracker) - with lingual rocking bolus anterior-posterior in oral cavity.  Pt did eventually transit bolus with adequate airway protection.    Caregiver reports it may take pt one hour to eat a meal and she will help pt when needed.  No indication of airway compromise with intake observed today.  Educated caregiver and pt to compensations for oral holding including providing items with improved sensory input *eg carbonated, strong flavors, temp variance, etc.    Encouraged pt to continue to self feed as much as able and if rely on others to direct feeding.   Advised to multiple risk factors for aspiration pneumonias besides just dysphagia.  As pt has caregiver present for all meals and they order appropriate items, will continue regular/thin  diet.  All education completed to mitigate dysphagia - no follow up indicated.      Aspiration Risk  Mild aspiration risk    Diet Recommendation Regular;Thin liquid   Liquid Administration via: Cup;Straw Medication Administration:  (as tolerated) Supervision: Patient able to self feed Compensations: Slow rate;Small sips/bites Postural Changes: Seated upright at 90 degrees    Other  Recommendations Oral Care Recommendations: Oral care BID   Follow up Recommendations    none n/a  Frequency and Duration            Prognosis   n/a     Swallow Study   General Date of Onset: 01/04/16 HPI: 82 yo female adm to [facility] after having 2 syncopal episodes during bowel movements in one week. Pt has h/o dementia, RA, memory problem.  She has a caregiver of 4 years present during SLP visit.   Swallow evaluation ordered upon admit.   Type of Study: Bedside Swallow Evaluation Previous Swallow Assessment: clinical evaluation 05/2015 rec reg/thin Diet Prior to this Study: Regular;Thin liquids Temperature Spikes Noted: No Respiratory Status: Room air History of Recent Intubation: No Behavior/Cognition: Alert;Cooperative;Pleasant mood (delayed responses) Oral Cavity Assessment: Within Functional Limits Oral Care Completed by SLP: No Oral Cavity - Dentition: Poor condition Vision: Impaired for self-feeding (? if pt with central vision deficits but peripheral better, caregiver admits pt with vision deficits) Self-Feeding Abilities: Needs assist (pt has arthritis, difficult to use her fingers/hands) Patient Positioning: Upright in bed Baseline Vocal Quality: Normal Volitional Cough: Weak Volitional Swallow: Unable to elicit  Oral/Motor/Sensory Function Overall Oral Motor/Sensory Function: Within functional limits   Ice Chips Ice chips: Not tested Other Comments: pt reports she does not like ice   Thin Liquid Thin Liquid: Within functional limits Presentation: Straw;Self Fed    Nectar Thick  Nectar Thick Liquid: Not tested   Honey Thick     Puree Puree: Impaired Presentation: Spoon Oral Phase Impairments: Reduced lingual movement/coordination;Poor awareness of bolus Oral Phase Functional Implications: Oral holding;Prolonged oral transit   Solid   GO   Solid: Impaired Presentation: Self Fed Oral Phase Impairments: Reduced lingual movement/coordination;Impaired mastication;Reduced labial seal Oral Phase Functional Implications: Prolonged oral transit;Oral holding;Impaired mastication    Functional Assessment Tool Used: clinical judgement Functional Limitations: Swallowing Swallow Current Status (X8333): At least 1 percent but less than 20 percent impaired, limited or restricted Swallow Goal Status [phone]): At least 1 percent but less than 20 percent impaired, limited or restricted Swallow Discharge Status [phone]): At least 1 percent but less than 20 percent impaired, limited or restricted   Donavan Burnet, MS [facility] SLP [phone]

## 2016-01-04 NOTE — Care Management Obs Status (Signed)
MEDICARE OBSERVATION STATUS NOTIFICATION   Patient Details  Name: Darlene Daniels MRN: [id number] Date of Birth: 11-Apr-1925   Medicare Observation Status Notification Given:  Yes    Yves Dill, RN 01/04/2016, 3:02 PM

## 2016-01-04 NOTE — Progress Notes (Signed)
  Echocardiogram 2D Echocardiogram has been performed.  Darlene Daniels 01/04/2016, 11:24 AM

## 2016-01-04 NOTE — Discharge Summary (Signed)
Physician Discharge Summary  Darlene Daniels KGM:[id number] DOB: [birth year] DOA: 01/03/2016  PCP: Alva Garnet., MD  Admit date: 01/03/2016 Discharge date: 01/04/2016  Admitted From: home  Disposition:  home     Discharge Condition:  stable   CODE STATUS:  Full code   Diet recommendation:  Regular diet Consultations:  none    Discharge Diagnoses:  Principal Problem:   Syncope and collapse Active Problems:   Rheumatoid arthritis (HCC)   Essential hypertension, benign   Tachy-brady syndrome (HCC)   Acute on chronic renal failure (HCC)   Dementia    Subjective: Patient is confused. Daughter and caretaker state that she has difficulty with drinking fluids and is not able to stay well hydrated. Constipation has been an issue for the past 2 wks.   Brief Summary/ HPI Darlene Daniels is a 82 y.o. female with medical history significant of HTN, recurrent syncope, tachybradycardia syndrome, rheumatoid arthritis, orthostatic hypotension, dementia urinary incontinence  Presented with 2 episodes of syncope in the past 1 week all associated with having a bowel movement. Patient was straining hard to have a BM and went limp patient woke up easily after verbally stimulated. No seizure activity noted. Event lasted just a few minutes. Previous to prior but Today was witnessed by family in the called 911. On EMS arrival blood pressure low down to 90s/50 patient was administered IV fluids 400 mL normal saline and blood pressure improved to 138/80. Patient appeared to be slightly lethargic, no neurological deficits was noted lethargy improved with IV rehydration she has significant dementia M able to provide her own history. Per family she has 24-hour caretakers. The episodes occurred when patient was straining to pass a bowel movement she did not really report any chest pain or headache no seizure-like activity per caretakers of note family reports that lately she's been having low heart rate and  blood pressure. Family thinks that she's been  struggling to be hydrated. Family reports constipation which they treated with aggressive bowel regimen- patient had had 2 large bowel movements     Hospital Course:   Syncope and collapse - orthostatics negative in emergency department - agree that this is consistent with vasovagal syncope in a setting of  bowel movement - Carotid duplex and ECHO are unrevealing  Essential hypertension, benign  - adjusted home medications- decreased Metoprolol due to bradycardia - increased Hydralazine to 50 TID as BP is elevated  Tachy-brady syndrome  - Metoprolol decreased due to bradycardia with HR in 50s  Renal insufficiency due to dehydration- CKD 4 -Cr improved with hydration  Hyperkalemia  - elevated potassium has improved with hydration/ kayexalate  Elevated Troponin - flat trend - elevated likely from AKI and vasovagal episode  Dementia      Discharge Instructions  Discharge Instructions    Diet - low sodium heart healthy    Complete by:  As directed   Increase activity slowly    Complete by:  As directed       Medication List    TAKE these medications   aspirin 81 MG tablet Take 81 mg by mouth daily.   b complex vitamins tablet Take 1 tablet by mouth daily.   bisacodyl 5 MG EC tablet Commonly known as:  bisacodyl Take 1-2 tablets (5-10 mg total) by mouth daily as needed for moderate constipation.   CELEBREX 200 MG capsule Generic drug:  celecoxib Take 200 mg by mouth 2 (two) times daily.   ferrous sulfate 325 (65 FE) MG tablet Take 325  mg by mouth daily.   folic acid 800 MCG tablet Commonly known as:  FOLVITE Take 800 mcg by mouth daily.   gabapentin 100 MG capsule Commonly known as:  NEURONTIN Take 100 mg by mouth every evening.   HUMIRA 40 MG/0.8ML Pskt Generic drug:  Adalimumab Inject 40 mg into the skin every 14 (fourteen) days.   hydrALAZINE 25 MG tablet Commonly known as:  APRESOLINE Take 25 mg by  mouth 2 (two) times daily.   hydroxypropyl methylcellulose / hypromellose 2.5 % ophthalmic solution Commonly known as:  ISOPTO TEARS / GONIOVISC Place 1 drop into both eyes 2 (two) times daily.   latanoprost 0.005 % ophthalmic solution Commonly known as:  XALATAN Place 1 drop into the left eye at bedtime.   megestrol 40 MG/ML suspension Commonly known as:  MEGACE Take 400 mg by mouth every evening.   memantine 28 MG Cp24 24 hr capsule Commonly known as:  NAMENDA XR Take 28 mg by mouth daily.   metoprolol tartrate 25 MG tablet Commonly known as:  LOPRESSOR TAKING 1.5 TABLETS DAILY What changed:  See the new instructions.   multivitamin capsule Take 1 capsule by mouth daily.   oxybutynin 5 MG tablet Commonly known as:  DITROPAN Take 5 mg by mouth 2 (two) times daily.   senna 8.6 MG Tabs tablet Commonly known as:  SENOKOT Take 1-2 tablets (8.6-17.2 mg total) by mouth at bedtime.   vitamin C 1000 MG tablet Take 1,000 mg by mouth daily.   Vitamin D3 5000 units Tabs Take 1 tablet by mouth daily.   Zinc 40 MG Tabs Take 80 mg by mouth daily.       Allergies  Allergen Reactions  . Sulfa Antibiotics     Unknown reaction      Procedures/Studies: 2 D ECHO Study Conclusions  - Left ventricle: The cavity size was normal. There was mild   concentric hypertrophy. Systolic function was normal. The   estimated ejection fraction was in the range of 55% to 60%. Wall   motion was normal; there were no regional wall motion   abnormalities. Left ventricular diastolic function parameters   were normal. - Aortic valve: Calcified non coronary cusp.  Vascular Ultrasound Carotid Duplex has been completed.  Preliminary findings: Bilateral: No significant (1-39%) ICA stenosis. Antegrade vertebral flow.   X-ray Chest Pa And Lateral  Result Date: 01/04/2016 CLINICAL DATA:  medical history significant of HTN, recurrent syncope, tachybradycardia syndrome, orthostatic hypotension.  Patient states no cough or congestion, no chest pain or sob. EXAM: CHEST  2 VIEW COMPARISON:  05/30/2015 FINDINGS: Heart is enlarged. The lungs are free of focal consolidations and pleural effusions. Pulmonary edema. Visualized osseous structures have a normal appearance. IMPRESSION: Stable cardiomegaly.  No evidence for acute pulmonary abnormality. Electronically Signed   By: Norva Pavlov M.D.   On: 01/04/2016 08:47       Discharge Exam: Vitals:   01/04/16 0458 01/04/16 0947  BP: (!) 150/94 (!) 168/103  Pulse: 68 61  Resp: 18   Temp: 98.1 F (36.7 C)    Vitals:   01/04/16 0000 01/04/16 0454 01/04/16 0458 01/04/16 0947  BP: (!) 219/92  (!) 150/94 (!) 168/103  Pulse: 61  68 61  Resp: 18  18   Temp: 98.2 F (36.8 C)  98.1 F (36.7 C)   TempSrc: Oral  Oral   SpO2: 100%  99%   Weight: 77.1 kg (170 lb) 77.6 kg (171 lb)    Height: 5\' 9"  (  1.753 m)       General: Pt is alert, awake, not in acute distress Cardiovascular: RRR, S1/S2 +, no rubs, no gallops Respiratory: CTA bilaterally, no wheezing, no rhonchi Abdominal: Soft, NT, ND, bowel sounds + Extremities: no edema, no cyanosis    The results of significant diagnostics from this hospitalization (including imaging, microbiology, ancillary and laboratory) are listed below for reference.     Microbiology: No results found for this or any previous visit (from the past 240 hour(s)).   Labs: BNP (last 3 results)  Recent Labs  05/31/15 1304  BNP 719.0*   Basic Metabolic Panel:  Recent Labs Lab 01/03/16 1646 01/04/16 0536  NA 139 140  K 5.3* 4.3  CL 107 109  CO2 23 23  GLUCOSE 153* 112*  BUN 36* 26*  CREATININE 2.58* 1.98*  CALCIUM 8.9 8.4*   Liver Function Tests: No results for input(s): AST, ALT, ALKPHOS, BILITOT, PROT, ALBUMIN in the last 168 hours. No results for input(s): LIPASE, AMYLASE in the last 168 hours. No results for input(s): AMMONIA in the last 168 hours. CBC:  Recent Labs Lab  01/03/16 1646 01/04/16 0536  WBC 8.0 7.3  HGB 10.9* 9.9*  HCT 33.7* 30.4*  MCV 99.4 99.0  PLT 195 190   Cardiac Enzymes:  Recent Labs Lab 01/03/16 2309 01/04/16 0536 01/04/16 1114  TROPONINI 0.26* 0.32* 0.39*   BNP: Invalid input(s): POCBNP CBG:  Recent Labs Lab 01/03/16 1628 01/04/16 0453  GLUCAP 170* 122*   D-Dimer No results for input(s): DDIMER in the last 72 hours. Hgb A1c No results for input(s): HGBA1C in the last 72 hours. Lipid Profile No results for input(s): CHOL, HDL, LDLCALC, TRIG, CHOLHDL, LDLDIRECT in the last 72 hours. Thyroid function studies  Recent Labs  01/04/16 0536  TSH 0.557   Anemia work up No results for input(s): VITAMINB12, FOLATE, FERRITIN, TIBC, IRON, RETICCTPCT in the last 72 hours. Urinalysis    Component Value Date/Time   COLORURINE YELLOW 01/03/2016 1600   APPEARANCEUR CLEAR 01/03/2016 1600   LABSPEC 1.025 01/03/2016 1600   PHURINE 5.5 01/03/2016 1600   GLUCOSEU NEGATIVE 01/03/2016 1600   HGBUR NEGATIVE 01/03/2016 1600   BILIRUBINUR NEGATIVE 01/03/2016 1600   KETONESUR NEGATIVE 01/03/2016 1600   PROTEINUR 30 (A) 01/03/2016 1600   NITRITE NEGATIVE 01/03/2016 1600   LEUKOCYTESUR NEGATIVE 01/03/2016 1600   Sepsis Labs Invalid input(s): PROCALCITONIN,  WBC,  LACTICIDVEN Microbiology No results found for this or any previous visit (from the past 240 hour(s)).   Time coordinating discharge: Over 30 minutes  SIGNED:   Calvert Cantor, MD  [facility] 01/04/2016, 4:10 PM Pager   If 7PM-7AM, please contact night-coverage [email or web address] Password [email or web address]

## 2016-01-04 NOTE — Care Management Note (Signed)
Case Management Note  Patient Details  Name: Darlene Daniels MRN: [id number] Date of Birth: 08-19-1924  Subjective/Objective:   Received call from attending for [facility]HC-HHRN-hydration-ivf.Recc-HHRN-hydration, nutrition, disease mgmnt. dtr Velna Hatchet chose [facility]-rep [facility] to accept, aware of d/c & HHC recc.Await HHRN orders. Family has private duty care-custodial level. Family has transp home.  No further CM needs.                 Action/Plan:d/c home w/HHC.   Expected Discharge Date:                  Expected Discharge Plan:  Home w Home Health Services  In-House Referral:     Discharge planning Services  CM Consult  Post Acute Care Choice:    Choice offered to:  [facility] POA / Guardian  DME Arranged:    DME Agency:     HH Arranged:  RN HH Agency:  [facility]  Status of Service:  Completed, signed off  If discussed at Long Length of Stay Meetings, dates discussed:    Additional Comments:  Lanier Clam, RN 01/04/2016, 4:22 PM

## 2016-01-04 NOTE — Progress Notes (Signed)
*  PRELIMINARY RESULTS* Vascular Ultrasound Carotid Duplex has been completed.  Preliminary findings: Bilateral: No significant (1-39%) ICA stenosis. Antegrade vertebral flow.   Farrel Demark, RDMS, RVT  01/04/2016, 9:37 AM

## 2016-03-07 ENCOUNTER — Emergency Department (HOSPITAL_COMMUNITY): Payer: Medicare Other

## 2016-03-07 ENCOUNTER — Encounter (HOSPITAL_COMMUNITY): Payer: Self-pay | Admitting: Emergency Medicine

## 2016-03-07 ENCOUNTER — Inpatient Hospital Stay (HOSPITAL_COMMUNITY)
Admission: EM | Admit: 2016-03-07 | Discharge: 2016-03-20 | DRG: 308 | Disposition: A | Discharge status: Hospice | Payer: Medicare Other | Attending: Internal Medicine | Admitting: Internal Medicine

## 2016-03-07 DIAGNOSIS — N3001 Acute cystitis with hematuria: Secondary | ICD-10-CM

## 2016-03-07 DIAGNOSIS — F0390 Unspecified dementia without behavioral disturbance: Secondary | ICD-10-CM | POA: Diagnosis present

## 2016-03-07 DIAGNOSIS — I519 Heart disease, unspecified: Secondary | ICD-10-CM

## 2016-03-07 DIAGNOSIS — Z515 Encounter for palliative care: Secondary | ICD-10-CM | POA: Diagnosis present

## 2016-03-07 DIAGNOSIS — I130 Hypertensive heart and chronic kidney disease with heart failure and stage 1 through stage 4 chronic kidney disease, or unspecified chronic kidney disease: Secondary | ICD-10-CM | POA: Diagnosis present

## 2016-03-07 DIAGNOSIS — J018 Other acute sinusitis: Secondary | ICD-10-CM | POA: Diagnosis not present

## 2016-03-07 DIAGNOSIS — G9340 Encephalopathy, unspecified: Secondary | ICD-10-CM | POA: Diagnosis not present

## 2016-03-07 DIAGNOSIS — K922 Gastrointestinal hemorrhage, unspecified: Secondary | ICD-10-CM

## 2016-03-07 DIAGNOSIS — I639 Cerebral infarction, unspecified: Secondary | ICD-10-CM

## 2016-03-07 DIAGNOSIS — R4182 Altered mental status, unspecified: Secondary | ICD-10-CM

## 2016-03-07 DIAGNOSIS — K319 Disease of stomach and duodenum, unspecified: Secondary | ICD-10-CM | POA: Diagnosis present

## 2016-03-07 DIAGNOSIS — R0603 Acute respiratory distress: Secondary | ICD-10-CM | POA: Diagnosis not present

## 2016-03-07 DIAGNOSIS — Z8673 Personal history of transient ischemic attack (TIA), and cerebral infarction without residual deficits: Secondary | ICD-10-CM

## 2016-03-07 DIAGNOSIS — N179 Acute kidney failure, unspecified: Secondary | ICD-10-CM | POA: Diagnosis not present

## 2016-03-07 DIAGNOSIS — N390 Urinary tract infection, site not specified: Secondary | ICD-10-CM | POA: Diagnosis present

## 2016-03-07 DIAGNOSIS — Z9181 History of falling: Secondary | ICD-10-CM

## 2016-03-07 DIAGNOSIS — Z791 Long term (current) use of non-steroidal anti-inflammatories (NSAID): Secondary | ICD-10-CM

## 2016-03-07 DIAGNOSIS — R0600 Dyspnea, unspecified: Secondary | ICD-10-CM

## 2016-03-07 DIAGNOSIS — I481 Persistent atrial fibrillation: Secondary | ICD-10-CM | POA: Diagnosis not present

## 2016-03-07 DIAGNOSIS — I272 Pulmonary hypertension, unspecified: Secondary | ICD-10-CM | POA: Diagnosis present

## 2016-03-07 DIAGNOSIS — E860 Dehydration: Secondary | ICD-10-CM | POA: Diagnosis present

## 2016-03-07 DIAGNOSIS — I480 Paroxysmal atrial fibrillation: Secondary | ICD-10-CM | POA: Diagnosis not present

## 2016-03-07 DIAGNOSIS — F0150 Vascular dementia without behavioral disturbance: Secondary | ICD-10-CM | POA: Diagnosis not present

## 2016-03-07 DIAGNOSIS — D62 Acute posthemorrhagic anemia: Secondary | ICD-10-CM | POA: Diagnosis not present

## 2016-03-07 DIAGNOSIS — Z882 Allergy status to sulfonamides status: Secondary | ICD-10-CM

## 2016-03-07 DIAGNOSIS — I471 Supraventricular tachycardia: Secondary | ICD-10-CM | POA: Diagnosis present

## 2016-03-07 DIAGNOSIS — I495 Sick sinus syndrome: Secondary | ICD-10-CM | POA: Diagnosis present

## 2016-03-07 DIAGNOSIS — G9341 Metabolic encephalopathy: Secondary | ICD-10-CM | POA: Diagnosis present

## 2016-03-07 DIAGNOSIS — Z7982 Long term (current) use of aspirin: Secondary | ICD-10-CM

## 2016-03-07 DIAGNOSIS — N178 Other acute kidney failure: Secondary | ICD-10-CM | POA: Diagnosis present

## 2016-03-07 DIAGNOSIS — Z7189 Other specified counseling: Secondary | ICD-10-CM

## 2016-03-07 DIAGNOSIS — I951 Orthostatic hypotension: Secondary | ICD-10-CM | POA: Diagnosis present

## 2016-03-07 DIAGNOSIS — Z803 Family history of malignant neoplasm of breast: Secondary | ICD-10-CM

## 2016-03-07 DIAGNOSIS — E875 Hyperkalemia: Secondary | ICD-10-CM | POA: Diagnosis present

## 2016-03-07 DIAGNOSIS — J328 Other chronic sinusitis: Secondary | ICD-10-CM | POA: Diagnosis not present

## 2016-03-07 DIAGNOSIS — I351 Nonrheumatic aortic (valve) insufficiency: Secondary | ICD-10-CM | POA: Diagnosis present

## 2016-03-07 DIAGNOSIS — Z79899 Other long term (current) drug therapy: Secondary | ICD-10-CM

## 2016-03-07 DIAGNOSIS — N170 Acute kidney failure with tubular necrosis: Secondary | ICD-10-CM | POA: Diagnosis not present

## 2016-03-07 DIAGNOSIS — K921 Melena: Secondary | ICD-10-CM | POA: Diagnosis not present

## 2016-03-07 DIAGNOSIS — Z833 Family history of diabetes mellitus: Secondary | ICD-10-CM

## 2016-03-07 DIAGNOSIS — N183 Chronic kidney disease, stage 3 (moderate): Secondary | ICD-10-CM | POA: Diagnosis present

## 2016-03-07 DIAGNOSIS — I5021 Acute systolic (congestive) heart failure: Secondary | ICD-10-CM | POA: Diagnosis present

## 2016-03-07 DIAGNOSIS — M069 Rheumatoid arthritis, unspecified: Secondary | ICD-10-CM | POA: Diagnosis present

## 2016-03-07 DIAGNOSIS — I10 Essential (primary) hypertension: Secondary | ICD-10-CM | POA: Diagnosis not present

## 2016-03-07 DIAGNOSIS — Z87440 Personal history of urinary (tract) infections: Secondary | ICD-10-CM

## 2016-03-07 DIAGNOSIS — R195 Other fecal abnormalities: Secondary | ICD-10-CM

## 2016-03-07 DIAGNOSIS — R32 Unspecified urinary incontinence: Secondary | ICD-10-CM | POA: Diagnosis present

## 2016-03-07 DIAGNOSIS — R748 Abnormal levels of other serum enzymes: Secondary | ICD-10-CM | POA: Diagnosis not present

## 2016-03-07 DIAGNOSIS — I4891 Unspecified atrial fibrillation: Principal | ICD-10-CM | POA: Diagnosis present

## 2016-03-07 DIAGNOSIS — N189 Chronic kidney disease, unspecified: Secondary | ICD-10-CM

## 2016-03-07 DIAGNOSIS — I248 Other forms of acute ischemic heart disease: Secondary | ICD-10-CM | POA: Diagnosis present

## 2016-03-07 DIAGNOSIS — D509 Iron deficiency anemia, unspecified: Secondary | ICD-10-CM | POA: Diagnosis present

## 2016-03-07 LAB — URINALYSIS, ROUTINE W REFLEX MICROSCOPIC
BILIRUBIN URINE: NEGATIVE
GLUCOSE, UA: NEGATIVE mg/dL
Ketones, ur: NEGATIVE mg/dL
Nitrite: NEGATIVE
PROTEIN: 100 mg/dL — AB
SPECIFIC GRAVITY, URINE: 1.018 (ref 1.005–1.030)
pH: 6.5 (ref 5.0–8.0)

## 2016-03-07 LAB — COMPREHENSIVE METABOLIC PANEL
ALBUMIN: 2.4 g/dL — AB (ref 3.5–5.0)
ALK PHOS: 59 U/L (ref 38–126)
ALT: 18 U/L (ref 14–54)
AST: 25 U/L (ref 15–41)
Anion gap: 9 (ref 5–15)
BUN: 55 mg/dL — ABNORMAL HIGH (ref 6–20)
CHLORIDE: 110 mmol/L (ref 101–111)
CO2: 20 mmol/L — AB (ref 22–32)
CREATININE: 3.65 mg/dL — AB (ref 0.44–1.00)
Calcium: 8.8 mg/dL — ABNORMAL LOW (ref 8.9–10.3)
GFR calc non Af Amer: 10 mL/min — ABNORMAL LOW (ref >60)
GFR, EST AFRICAN AMERICAN: 12 mL/min — AB (ref >60)
GLUCOSE: 198 mg/dL — AB (ref 65–99)
Potassium: 5.8 mmol/L — ABNORMAL HIGH (ref 3.5–5.1)
SODIUM: 139 mmol/L (ref 135–145)
Total Bilirubin: 0.4 mg/dL (ref 0.3–1.2)
Total Protein: 6.7 g/dL (ref 6.5–8.1)

## 2016-03-07 LAB — CBG MONITORING, ED: Glucose-Capillary: 152 mg/dL — ABNORMAL HIGH (ref 65–99)

## 2016-03-07 LAB — PROTIME-INR
INR: 1.31
PROTHROMBIN TIME: 16.4 s — AB (ref 11.4–15.2)

## 2016-03-07 LAB — I-STAT TROPONIN, ED: TROPONIN I, POC: 0.27 ng/mL — AB (ref 0.00–0.08)

## 2016-03-07 LAB — MRSA PCR SCREENING: MRSA BY PCR: NEGATIVE

## 2016-03-07 LAB — URINE MICROSCOPIC-ADD ON

## 2016-03-07 LAB — AMMONIA: AMMONIA: 9 umol/L (ref 9–35)

## 2016-03-07 LAB — CBC
HCT: 29.5 % — ABNORMAL LOW (ref 36.0–46.0)
HEMOGLOBIN: 9.4 g/dL — AB (ref 12.0–15.0)
MCH: 30.8 pg (ref 26.0–34.0)
MCHC: 31.9 g/dL (ref 30.0–36.0)
MCV: 96.7 fL (ref 78.0–100.0)
Platelets: 239 10*3/uL (ref 150–400)
RBC: 3.05 MIL/uL — AB (ref 3.87–5.11)
RDW: 15.9 % — ABNORMAL HIGH (ref 11.5–15.5)
WBC: 7.9 10*3/uL (ref 4.0–10.5)

## 2016-03-07 LAB — I-STAT CG4 LACTIC ACID, ED
LACTIC ACID, VENOUS: 1.93 mmol/L — AB (ref 0.5–1.9)
Lactic Acid, Venous: 2.53 mmol/L (ref 0.5–1.9)

## 2016-03-07 LAB — APTT: aPTT: 72 seconds — ABNORMAL HIGH (ref 24–36)

## 2016-03-07 MED ORDER — HEPARIN (PORCINE) IN NACL 100-0.45 UNIT/ML-% IJ SOLN
900.0000 [IU]/h | INTRAMUSCULAR | Status: DC
  Administered 2016-03-07 – 2016-03-10 (×3): 900 [IU]/h via INTRAVENOUS
  Filled 2016-03-07 (×4): qty 250

## 2016-03-07 MED ORDER — ACETAMINOPHEN 325 MG PO TABS: 650.0000 mg | ORAL_TABLET | Freq: Four times a day (QID) | ORAL | Status: DC | PRN

## 2016-03-07 MED ORDER — HEPARIN BOLUS VIA INFUSION
3000.0000 [IU] | Freq: Once | INTRAVENOUS | Status: AC
  Administered 2016-03-07: 3000 [IU] via INTRAVENOUS
  Filled 2016-03-07: qty 3000

## 2016-03-07 MED ORDER — DILTIAZEM LOAD VIA INFUSION
10.0000 mg | Freq: Once | INTRAVENOUS | Status: AC
  Administered 2016-03-07: 10 mg via INTRAVENOUS
  Filled 2016-03-07: qty 10

## 2016-03-07 MED ORDER — OXYBUTYNIN CHLORIDE 5 MG PO TABS
5.0000 mg | ORAL_TABLET | Freq: Two times a day (BID) | ORAL | Status: DC
  Administered 2016-03-07 – 2016-03-20 (×25): 5 mg via ORAL
  Filled 2016-03-07 (×26): qty 1

## 2016-03-07 MED ORDER — DEXTROSE 5 % IV SOLN
1.0000 g | Freq: Once | INTRAVENOUS | Status: AC
  Administered 2016-03-07: 1 g via INTRAVENOUS
  Filled 2016-03-07: qty 10

## 2016-03-07 MED ORDER — MEGESTROL ACETATE 40 MG/ML PO SUSP
400.0000 mg | Freq: Every evening | ORAL | Status: DC
  Administered 2016-03-07 – 2016-03-19 (×13): 400 mg via ORAL
  Filled 2016-03-07 (×14): qty 10

## 2016-03-07 MED ORDER — HYDRALAZINE HCL 25 MG PO TABS
25.0000 mg | ORAL_TABLET | Freq: Two times a day (BID) | ORAL | Status: DC
  Administered 2016-03-07 – 2016-03-09 (×4): 25 mg via ORAL
  Filled 2016-03-07 (×4): qty 1

## 2016-03-07 MED ORDER — SODIUM CHLORIDE 0.9 % IV SOLN
INTRAVENOUS | Status: DC
  Administered 2016-03-07 – 2016-03-13 (×6): via INTRAVENOUS

## 2016-03-07 MED ORDER — B COMPLEX PO TABS: 1.0000 | ORAL_TABLET | Freq: Every day | ORAL | Status: DC

## 2016-03-07 MED ORDER — SENNA 8.6 MG PO TABS
1.0000 | ORAL_TABLET | Freq: Every day | ORAL | Status: DC
  Administered 2016-03-07 – 2016-03-19 (×12): 8.6 mg via ORAL
  Filled 2016-03-07 (×12): qty 1

## 2016-03-07 MED ORDER — B COMPLEX-C PO TABS
1.0000 | ORAL_TABLET | Freq: Every day | ORAL | Status: DC
  Administered 2016-03-08 – 2016-03-20 (×12): 1 via ORAL
  Filled 2016-03-07 (×13): qty 1

## 2016-03-07 MED ORDER — MEMANTINE HCL ER 28 MG PO CP24
28.0000 mg | ORAL_CAPSULE | Freq: Every evening | ORAL | Status: DC
  Administered 2016-03-07 – 2016-03-16 (×10): 28 mg via ORAL
  Filled 2016-03-07 (×10): qty 1

## 2016-03-07 MED ORDER — SODIUM CHLORIDE 0.9 % IV BOLUS (SEPSIS): 500.0000 mL | Freq: Once | INTRAVENOUS | Status: DC

## 2016-03-07 MED ORDER — SODIUM POLYSTYRENE SULFONATE 15 GM/60ML PO SUSP
15.0000 g | Freq: Once | ORAL | Status: AC
  Administered 2016-03-07: 15 g via ORAL
  Filled 2016-03-07: qty 60

## 2016-03-07 MED ORDER — VITAMIN C 500 MG PO TABS
1000.0000 mg | ORAL_TABLET | Freq: Every day | ORAL | Status: DC
  Administered 2016-03-08 – 2016-03-20 (×12): 1000 mg via ORAL
  Filled 2016-03-07 (×14): qty 2

## 2016-03-07 MED ORDER — ADULT MULTIVITAMIN W/MINERALS CH
1.0000 | ORAL_TABLET | Freq: Every day | ORAL | Status: DC
  Administered 2016-03-10 – 2016-03-20 (×10): 1 via ORAL
  Filled 2016-03-07 (×11): qty 1

## 2016-03-07 MED ORDER — SODIUM CHLORIDE 0.9 % IV BOLUS (SEPSIS)
1000.0000 mL | Freq: Once | INTRAVENOUS | Status: AC
  Administered 2016-03-07: 1000 mL via INTRAVENOUS

## 2016-03-07 MED ORDER — MULTIVITAMINS PO CAPS: 1.0000 | ORAL_CAPSULE | Freq: Every day | ORAL | Status: DC

## 2016-03-07 MED ORDER — VITAMIN D 1000 UNITS PO TABS
5000.0000 [IU] | ORAL_TABLET | Freq: Every day | ORAL | Status: DC
  Administered 2016-03-08 – 2016-03-20 (×12): 5000 [IU] via ORAL
  Filled 2016-03-07 (×14): qty 5

## 2016-03-07 MED ORDER — DEXTROSE 5 % IV SOLN
1.0000 g | INTRAVENOUS | Status: DC
  Administered 2016-03-08 – 2016-03-10 (×3): 1 g via INTRAVENOUS
  Filled 2016-03-07 (×4): qty 10

## 2016-03-07 MED ORDER — METOPROLOL TARTRATE 25 MG PO TABS
37.5000 mg | ORAL_TABLET | Freq: Every day | ORAL | Status: DC
  Administered 2016-03-07 – 2016-03-09 (×3): 37.5 mg via ORAL
  Filled 2016-03-07 (×3): qty 1

## 2016-03-07 MED ORDER — DILTIAZEM HCL-DEXTROSE 100-5 MG/100ML-% IV SOLN (PREMIX)
5.0000 mg/h | INTRAVENOUS | Status: DC
  Administered 2016-03-07 – 2016-03-08 (×2): 5 mg/h via INTRAVENOUS
  Administered 2016-03-08: 7.5 mg/h via INTRAVENOUS
  Administered 2016-03-08: 10 mg/h via INTRAVENOUS
  Administered 2016-03-09: 12.5 mg/h via INTRAVENOUS
  Filled 2016-03-07 (×5): qty 100

## 2016-03-07 MED ORDER — GABAPENTIN 100 MG PO CAPS
100.0000 mg | ORAL_CAPSULE | Freq: Every evening | ORAL | Status: DC
  Administered 2016-03-07 – 2016-03-19 (×13): 100 mg via ORAL
  Filled 2016-03-07 (×13): qty 1

## 2016-03-07 MED ORDER — POLYVINYL ALCOHOL 1.4 % OP SOLN
1.0000 [drp] | Freq: Two times a day (BID) | OPHTHALMIC | Status: DC
  Administered 2016-03-09 – 2016-03-20 (×22): 1 [drp] via OPHTHALMIC
  Filled 2016-03-07 (×5): qty 15

## 2016-03-07 MED ORDER — ACETAMINOPHEN 650 MG RE SUPP: 650.0000 mg | Freq: Four times a day (QID) | RECTAL | Status: DC | PRN

## 2016-03-07 MED ORDER — FOLIC ACID 1 MG PO TABS
1.0000 mg | ORAL_TABLET | Freq: Every day | ORAL | Status: DC
  Administered 2016-03-08 – 2016-03-20 (×12): 1 mg via ORAL
  Filled 2016-03-07 (×13): qty 1

## 2016-03-07 MED ORDER — FERROUS SULFATE 325 (65 FE) MG PO TABS
325.0000 mg | ORAL_TABLET | Freq: Every day | ORAL | Status: DC
  Administered 2016-03-08 – 2016-03-20 (×12): 325 mg via ORAL
  Filled 2016-03-07 (×13): qty 1

## 2016-03-07 NOTE — ED Provider Notes (Signed)
[facility]-EMERGENCY DEPT Provider Note   CSN: [id number] Arrival date & time: 03/07/16  1111     History   Chief Complaint Chief Complaint  Patient presents with  . Altered Mental Status    HPI Darlene Daniels is a 82 y.o. female With the past medical history significant for atrial fibrillation not on anticoagulation therapy, Rheumatoid arthritis, and recent urinary tract infections who presents with fatigue, altered mental status, and dysuria. Patient is accompanied by family reports that for the last few days, patient has been very sleepy. They report that the last month, she has been taking multiple antibiotics including Cipro, Macrobid, and cefuroxime for urinary tract infection. The report that her urine has continued to look dark and is foul-smelling. Patient does report dysuria. They say that the patient has not been wanting to interact and they're concerned about her fatigue level. They report she has not passed out and patient denies any chest pain, abdominal pain, shortness of breath, cough, constipation, diarrhea. Patient denies palpitations or sensation of tachycardia.    The history is provided by the patient, medical records, a caregiver and a relative. The history is limited by the condition of the patient.  Altered Mental Status   This is a new problem. The current episode started more than 2 days ago. The problem has been gradually worsening. Associated symptoms include confusion and somnolence. Pertinent negatives include no seizures, no weakness, no agitation and no delusions. Risk factors include a recent infection (recent UTI). Her past medical history does not include dementia.    Past Medical History:  Diagnosis Date  . Abnormality of gait   . Arthritis   . Memory loss   . Other persistent mental disorders due to conditions classified elsewhere   . Palpitations   . Paroxysmal tachycardia, unspecified   . Rheumatoid arthritis (HCC)   . Tachycardia   . Urinary  incontinence     Patient Active Problem List   Diagnosis Date Noted  . Dementia 01/04/2016  . Acute on chronic renal failure (HCC) 01/03/2016  . Orthostatic hypotension 05/30/2015  . Macrocytic anemia 05/30/2015  . AKI (acute kidney injury) (HCC) 05/30/2015  . Syncope and collapse 05/30/2015  . Essential hypertension, benign 03/10/2013  . Tachy-brady syndrome (HCC) 03/10/2013    Class: Chronic  . Memory loss   . Palpitations   . Paroxysmal tachycardia, unspecified (HCC)   . Arthritis   . Urinary incontinence   . Rheumatoid arthritis (HCC)   . Tachycardia   . 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 Abnormality of gait 12/27/2012    History reviewed. No pertinent surgical history.  OB History    No data available       Home Medications    Prior to Admission medications   Medication Sig Start Date End Date Taking? Authorizing Provider  Adalimumab (HUMIRA) 40 MG/0.8ML PSKT Inject 40 mg into the skin every 14 (fourteen) days.   Yes Historical Provider, MD  Ascorbic Acid (VITAMIN C) 1000 MG tablet Take 1,000 mg by mouth daily.   Yes Historical Provider, MD  aspirin EC 81 MG tablet Take 81 mg by mouth daily.   Yes Historical Provider, MD  b complex vitamins tablet Take 1 tablet by mouth daily.   Yes Historical Provider, MD  celecoxib (CELEBREX) 200 MG capsule Take 200 mg by mouth 2 (two) times daily.    Yes Historical Provider, MD  Cholecalciferol (VITAMIN D3) 5000 units TABS Take 5,000 Units by mouth daily.  Yes Historical Provider, MD  ferrous sulfate 325 (65 FE) MG tablet Take 325 mg by mouth daily.   Yes Historical Provider, MD  folic acid (FOLVITE) 800 MCG tablet Take 800 mcg by mouth daily.    Yes Historical Provider, MD  gabapentin (NEURONTIN) 100 MG capsule Take 100 mg by mouth every evening.   Yes Historical Provider, MD  hydrALAZINE (APRESOLINE) 25 MG tablet Take 25 mg by mouth 2 (two) times daily. 12/13/15  Yes  Historical Provider, MD  megestrol (MEGACE) 40 MG/ML suspension Take 400 mg by mouth every evening.   Yes Historical Provider, MD  Memantine HCl ER (NAMENDA XR) 28 MG CP24 Take 28 mg by mouth daily. Patient taking differently: Take 28 mg by mouth every evening.  08/05/13  Yes Levert Feinstein, MD  metoprolol tartrate (LOPRESSOR) 25 MG tablet Take 37.5 mg by mouth daily.   Yes Historical Provider, MD  Multiple Vitamin (MULTIVITAMIN) capsule Take 1 capsule by mouth daily.   Yes Historical Provider, MD  OVER THE COUNTER MEDICATION Take 80 mg by mouth daily. Zinc 80mg    Yes Historical Provider, MD  oxybutynin (DITROPAN) 5 MG tablet Take 5 mg by mouth 2 (two) times daily.    Yes Historical Provider, MD  polyvinyl alcohol (LIQUIFILM TEARS) 1.4 % ophthalmic solution Place 1 drop into both eyes 2 (two) times daily.   Yes Historical Provider, MD  senna (SENOKOT) 8.6 MG TABS tablet Take 1-2 tablets (8.6-17.2 mg total) by mouth at bedtime. 01/04/16  Yes Calvert Cantor, MD  bisacodyl (BISACODYL) 5 MG EC tablet Take 1-2 tablets (5-10 mg total) by mouth daily as needed for moderate constipation. Patient not taking: Reported on 03/07/2016 01/04/16   Calvert Cantor, MD    Family History Family History  Problem Relation Age of Onset  . Breast cancer Daughter   . Diabetes Daughter     Social History Social History  Substance Use Topics  . Smoking status: Never Smoker  . Smokeless tobacco: Never Used  . Alcohol use No     Allergies   Sulfa antibiotics   Review of Systems Review of Systems  Constitutional: Positive for appetite change, chills and fatigue. Negative for activity change, diaphoresis and fever.  HENT: Negative for congestion and rhinorrhea.   Eyes: Negative for visual disturbance.  Respiratory: Negative for cough, chest tightness, shortness of breath and stridor.   Cardiovascular: Negative for chest pain, palpitations and leg swelling.  Gastrointestinal: Negative for abdominal distention,  abdominal pain, constipation, diarrhea, nausea and vomiting.  Genitourinary: Positive for dysuria. Negative for difficulty urinating, flank pain, frequency, hematuria, menstrual problem, pelvic pain, vaginal bleeding and vaginal discharge.  Musculoskeletal: Negative for back pain and neck pain.  Skin: Negative for rash and wound.  Neurological: Negative for dizziness, seizures, weakness, light-headedness, numbness and headaches.  Psychiatric/Behavioral: Positive for confusion. Negative for agitation.  All other systems reviewed and are negative.    Physical Exam Updated Vital Signs BP 124/90   Pulse 114   Temp 97 F (36.1 C) (Oral)   Resp 18   SpO2 100%   Physical Exam  Constitutional: She appears well-developed and well-nourished. She appears lethargic. No distress.  HENT:  Head: Normocephalic and atraumatic.  Mouth/Throat: Oropharynx is clear and moist. No oropharyngeal exudate.  Eyes: Conjunctivae and EOM are normal. Pupils are equal, round, and reactive to light.  Neck: Normal range of motion. Neck supple.  Cardiovascular: An irregular rhythm present. Tachycardia present.   No murmur heard. Pulmonary/Chest: Effort normal and breath sounds  normal. No stridor. No tachypnea. No respiratory distress.  Abdominal: Soft. There is no tenderness. There is no guarding.  Musculoskeletal: She exhibits no edema or tenderness.  Neurological: She appears lethargic. She is disoriented. She displays no tremor. No cranial nerve deficit or sensory deficit. She exhibits normal muscle tone. GCS eye subscore is 3. GCS verbal subscore is 5. GCS motor subscore is 6.  Skin: Skin is warm and dry. Capillary refill takes less than 2 seconds. No rash noted. No erythema.  Psychiatric: She has a normal mood and affect.  Nursing note and vitals reviewed.    ED Treatments / Results  Labs (all labs ordered are listed, but only abnormal results are displayed) Labs Reviewed  COMPREHENSIVE METABOLIC PANEL -  Abnormal; Notable for the following:       Result Value   Potassium 5.8 (*)    CO2 20 (*)    Glucose, Bld 198 (*)    BUN 55 (*)    Creatinine, Ser 3.65 (*)    Calcium 8.8 (*)    Albumin 2.4 (*)    GFR calc non Af Amer 10 (*)    GFR calc Af Amer 12 (*)    All other components within normal limits  CBC - Abnormal; Notable for the following:    RBC 3.05 (*)    Hemoglobin 9.4 (*)    HCT 29.5 (*)    RDW 15.9 (*)    All other components within normal limits  URINALYSIS, ROUTINE W REFLEX MICROSCOPIC (NOT AT [facility]) - Abnormal; Notable for the following:    APPearance CLOUDY (*)    Hgb urine dipstick LARGE (*)    Protein, ur 100 (*)    Leukocytes, UA LARGE (*)    All other components within normal limits  URINE MICROSCOPIC-ADD ON - Abnormal; Notable for the following:    Squamous Epithelial / LPF 0-5 (*)    Bacteria, UA MANY (*)    All other components within normal limits  PROTIME-INR - Abnormal; Notable for the following:    Prothrombin Time 16.4 (*)    All other components within normal limits  CBG MONITORING, ED - Abnormal; Notable for the following:    Glucose-Capillary 152 (*)    All other components within normal limits  I-STAT CG4 LACTIC ACID, ED - Abnormal; Notable for the following:    Lactic Acid, Venous 2.53 (*)    All other components within normal limits  I-STAT TROPOININ, ED - Abnormal; Notable for the following:    Troponin i, poc 0.27 (*)    All other components within normal limits  I-STAT CG4 LACTIC ACID, ED - Abnormal; Notable for the following:    Lactic Acid, Venous 1.93 (*)    All other components within normal limits  URINE CULTURE  CULTURE, BLOOD (ROUTINE X 2)  CULTURE, BLOOD (ROUTINE X 2)  AMMONIA  TSH  APTT  HEPARIN LEVEL (UNFRACTIONATED)    EKG  EKG Interpretation  Date/Time:  Tuesday March 07 2016 12:12:41 EST Ventricular Rate:  132 PR Interval:    QRS Duration: 86 QT Interval:  290 QTC Calculation: 430 R Axis:   -11 Text  Interpretation:  Atrial fibrillation Abnormal R-wave progression, early transition Borderline T wave abnormalities Afib.  Abnormal ECG Confirmed by Rush Landmark MD, CHRISTOPHER [phone]) on 03/07/2016 4:42:25 PM       Radiology Dg Chest 2 View  Result Date: 03/07/2016 CLINICAL DATA:  Altered mental status, lethargy for the past 3 days. EXAM: CHEST  2 VIEW  COMPARISON:  PA and lateral chest x-ray of January 04, 2016 FINDINGS: The lungs are adequately inflated. There is no focal infiltrate. There is no pleural effusion. The cardiac silhouette remains enlarged. The pulmonary vascularity is prominent centrally but there is no cephalization of the vascular pattern. There is calcification in the wall of the aortic arch. The mediastinum is normal in width. There is no pleural effusion. The bony thorax exhibits no acute abnormality. There degenerative changes of both shoulders. IMPRESSION: Cardiomegaly without pulmonary edema.  No acute pneumonia. Thoracic aortic atherosclerosis. Electronically Signed   By: David  [place] M.D.   On: 03/07/2016 14:06   Ct Head Wo Contrast  Result Date: 03/07/2016 CLINICAL DATA:  Altered mental status, weakness and lethargy for 3 days. EXAM: CT HEAD WITHOUT CONTRAST TECHNIQUE: Contiguous axial images were obtained from the base of the skull through the vertex without intravenous contrast. COMPARISON:  05/30/2015 FINDINGS: Brain: Stable age related cerebral atrophy, ventriculomegaly and periventricular white matter disease. New area of low attenuation in the left occipital lobe consistent with an infarct. This does not appear acute and could be subacute or chronic. No extra-axial fluid collections. No intracranial hemorrhage. No mass lesions. Vascular: Stable extensive vascular calcifications but no aneurysm or hyperdense vessels. Skull: No skull fracture or bone lesion. Sinuses/Orbits: Extensive paranasal sinus disease with possible sinonasal polyposis. Evidence of prior sinus surgery.  The globes are intact. Other: No scalp lesions. IMPRESSION: Left occipital infarct is new since the prior CT from February 2017 but does not appear acute. It is probably subacute or chronic. No intracranial hemorrhage or mass lesion. Chronic sinus disease with possible sinonasal polyposis. Electronically Signed   By: Rudie Meyer M.D.   On: 03/07/2016 14:35    Procedures Procedures (including critical care time)  Angiocath insertion Performed by: Canary Brim Tegeler  Consent: Verbal consent obtained. Risks and benefits: risks, benefits and alternatives were discussed Time out: Immediately prior to procedure a "time out" was called to verify the correct patient, procedure, equipment, support staff and site/side marked as required.  Preparation: Patient was prepped and draped in the usual sterile fashion.  Vein Location: Right arm  Ultrasound Guided  Gauge: 20  Normal blood return and flush without difficulty Patient tolerance: Patient tolerated the procedure well with no immediate complications.     Medications Ordered in ED Medications  diltiazem (CARDIZEM) 100 mg in dextrose 5% (1 mg/mL) infusion (10 mg/hr Intravenous Rate/Dose Change 03/07/16 1930)  heparin ADULT infusion 100 units/mL (25000 units/232mL sodium chloride 0.45%) (900 Units/hr Intravenous New Bag/Given 03/07/16 2015)  sodium chloride 0.9 % bolus 1,000 mL (0 mLs Intravenous Stopped 03/07/16 1949)  cefTRIAXone (ROCEPHIN) 1 g in dextrose 5 % 50 mL IVPB (0 g Intravenous Stopped 03/07/16 1741)  diltiazem (CARDIZEM) 1 mg/mL load via infusion 10 mg (10 mg Intravenous Bolus from Bag 03/07/16 1911)  sodium polystyrene (KAYEXALATE) 15 GM/60ML suspension 15 g (15 g Oral Given 03/07/16 1914)  heparin bolus via infusion 3,000 Units (3,000 Units Intravenous Bolus from Bag 03/07/16 2016)     Initial Impression / Assessment and Plan / ED Course  I have reviewed the triage vital signs and the nursing notes.  Pertinent  labs & imaging results that were available during my care of the patient were reviewed by me and considered in my medical decision making (see chart for details).  Clinical Course     Darlene Daniels is a 82 y.o. female With the past medical history significant for atrial fibrillation not on  anticoagulation therapy, Rheumatoid arthritis, and recent urinary tract infections who presents with fatigue, altered mental status, and dysuria.  History and exam are seen above.  On initial examination, patient is somnolent but easily aroused. Patient is oriented to person, place, and time. Patient has no focal neurologic deficits on exam however keeping patient awake to follow commands is challenging. Patient has no abdominal tenderness. Lungs are clear. Patient has edema in her bilateral feet. Mild edema in the lower extremities otherwise.   Patients initial EKG shows atrial fibrillation. Family reports they think patient has had history of this arrhythmia in the past.   Given patient's fatigue, continued urinary symptoms, and somnolence, clinical concern for dehydration versus infection. Patient had laboratory testing, chest x-ray, and CT head to look for etiology.  CT head showed evidence of Left-sided CVA. This is new since February of this year. It does not appear acute on imaging. No evidence of hemorrhage. Family informed of this finding.  Chest x-ray showed No evidence of pulmonary edema or pneumonia. Given patient's tachycardia, and now no evidence of edema, fluids were ordered.  Ultrasound IV was placed Two times by me. Patient pulled out the first one and it was repeated.  Lab testing showed elevated lactic acid, fluids were continued. Urinalysis showed evidence of UTI. Metabolic panel showed acute kidney injury with credit of 3.65. Potassium slightly elevated at 5.8. No evidence of sharp T waves on EKG.  Hemoglobin stable at 9.4 from prior and no leukocytosis.  Given patients altered mental  status, continued tachycardia with atrial fibrillation, dehydration, and acute kidney injury, patient admitted to hospitalist service for further workup and management. Suspect patient will need further imaging of had with new finding of stroke. Patient will likely need further management of atrial fibrillation as there is evidence of prior stroke. Patient was given antibiotics, cultures obtained, and patient was admitted in stable condition. Family agreed With plan of admission.    Final Clinical Impressions(s) / ED Diagnoses   Final diagnoses:  Altered mental status, unspecified altered mental status type  AKI (acute kidney injury) (HCC)    Clinical Impression: 1. Altered mental status, unspecified altered mental status type   2. AKI (acute kidney injury) [facility](HCC)     Disposition: Admit to Hospitalist service    Heide Scaleshristopher J Tegeler, MD 03/07/16 2040

## 2016-03-07 NOTE — ED Notes (Signed)
Spoke with Donell Beers, RN (Primary ICU Nurse) and 20 minute started.

## 2016-03-07 NOTE — ED Notes (Addendum)
Notified Dr. Rush Landmark of patients discontinued IV since she had multiple attempts earlier. He reports he will be in to obtain an ultrasound IV.

## 2016-03-07 NOTE — Progress Notes (Signed)
ANTICOAGULATION CONSULT NOTE - Initial Consult  Pharmacy Consult for IV heparin Indication: atrial fibrillation  Allergies  Allergen Reactions  . Sulfa Antibiotics Other (See Comments)    Unknown reaction     Patient Measurements:   Heparin Dosing Weight: 77 kg (used old wt from 9/17)  Vital Signs: Temp: 98.5 F (36.9 C) (11/14 1619) Temp Source: Oral (11/14 1619) BP: 148/117 (11/14 1813) Pulse Rate: 114 (11/14 1813)  Labs:  Recent Labs  03/07/16 1157  HGB 9.4*  HCT 29.5*  PLT 239  CREATININE 3.65*    CrCl cannot be calculated (Unknown ideal weight.).   Medical History: Past Medical History:  Diagnosis Date  . Abnormality of gait   . Arthritis   . Memory loss   . Other persistent mental disorders due to conditions classified elsewhere   . Palpitations   . Paroxysmal tachycardia, unspecified   . Rheumatoid arthritis (HCC)   . Tachycardia   . Urinary incontinence     Medications:  Scheduled:  . diltiazem  10 mg Intravenous Once  . sodium polystyrene  15 g Oral Once   Infusions:  . diltiazem (CARDIZEM) infusion      Assessment: 31 yoF c/o AMS and lethargy x 3 days now in A-fib.  IV heparin per Rx. Goal of Therapy:  Heparin level 0.3-0.7 units/ml Monitor platelets by anticoagulation protocol: Yes   Plan:   Baseline coags stat  Ht/wt stat (used old wt b/c ED did not enter new wt)  Heparin 3000 unit bolus x1  Start heparin drip @ 900 units/hr ( lower dose b/c of age/CrCl)  Daily CBC/HL  Check 1st HL in 8 hours  Lorenza Evangelist 03/07/2016,6:19 PM

## 2016-03-07 NOTE — ED Triage Notes (Signed)
Patient brought in by family for AMS and lethargic over the past 3 days.  Patient has had issues with UTIs per family member since 02/01/16 and been on couple medications but "doesn't seem to clear up".

## 2016-03-07 NOTE — ED Notes (Signed)
IV team at bedside 

## 2016-03-07 NOTE — ED Notes (Signed)
Patient transported to X-ray 

## 2016-03-07 NOTE — H&P (Signed)
TRH H&P   Patient Demographics:    Darlene Daniels, is a 82 y.o. female  MRN: [id number]   DOB - 10-Sep-1924  Admit Date - 03/07/2016  Outpatient Primary MD for the patient is Alva Garnet., MD  Referring MD/NP/PA: Dr Julieanne Manson  Outpatient Specialists: Cardiology Dr Katrinka Blazing  Patient coming from: Home  Chief Complaint  Patient presents with  . Altered Mental Status      HPI:    Darlene Daniels  is a 82 y.o. female, with medical history significant of HTN, recurrent syncope, tachybradycardia syndrome, rheumatoid arthritis, orthostatic hypotension, dementia ,urinary incontinence, patient was brought by her daughter today for altered mental status, increased confusion and lethargy over last week, as well poor appetite, patient has been diagnosed with UTI 3 by PCP over the last 2 months where she was given Cipro, nitrofurantoin and Keflex, daughter denies any fever, chills, cough, productive sputum, hemoptysis, patient denies any chest pain, shortness of breath, polyuria or dysuria. - In ED Patient was found to be in A. fib, which is new onset, positive urinalysis, started on Rocephin, as well worsening renal function, baseline creatinine is 1.9, today is 3.6, daughter reports poor oral intake, chest x-ray with no pneumonia, CT head with evidence of subacute/chronic CVA daughter reports mother having any focal deficits in speech or facial droop,    Review of systems:    In addition to the HPI above, No Fever-chills, No Headache, No changes with Vision or hearing, No problems swallowing food or Liquids, No Chest pain, Cough or Shortness of Breath, No Abdominal pain, No Nausea or Vommitting, Bowel movements are regular, No Blood in stool or Urine, No dysuria,Or polyuria, daughter reports dark-colored urine with strong odor No new skin rashes or bruises, No new joints pains-aches,    No new weakness, tingling, numbness in any extremity, utilized weakness but nothing focal No recent weight gain or loss, No polyuria, polydypsia or polyphagia, Agent more confused and lethargic over last 5 days as per daughter  A full 10 point Review of Systems was done, except as stated above, all other Review of Systems were negative.   With Past History of the following :    Past Medical History:  Diagnosis Date  . Abnormality of gait   . Arthritis   . Memory loss   . Other persistent mental disorders due to conditions classified elsewhere   . Palpitations   . Paroxysmal tachycardia, unspecified   . Rheumatoid arthritis (HCC)   . Tachycardia   . Urinary incontinence       History reviewed. No pertinent surgical history.    Social History:     Social History  Substance Use Topics  . Smoking status: Never Smoker  . Smokeless tobacco: Never Used  . Alcohol use No     Lives - At home with daughter  Mobility - only with assistance, requiring walker over the  last few weeks     Family History :     Family History  Problem Relation Age of Onset  . Breast cancer Daughter   . Diabetes Daughter       Home Medications:   Prior to Admission medications   Medication Sig Start Date End Date Taking? Authorizing Provider  Adalimumab (HUMIRA) 40 MG/0.8ML PSKT Inject 40 mg into the skin every 14 (fourteen) days.   Yes Historical Provider, MD  Ascorbic Acid (VITAMIN C) 1000 MG tablet Take 1,000 mg by mouth daily.   Yes Historical Provider, MD  aspirin EC 81 MG tablet Take 81 mg by mouth daily.   Yes Historical Provider, MD  b complex vitamins tablet Take 1 tablet by mouth daily.   Yes Historical Provider, MD  celecoxib (CELEBREX) 200 MG capsule Take 200 mg by mouth 2 (two) times daily.    Yes Historical Provider, MD  Cholecalciferol (VITAMIN D3) 5000 units TABS Take 5,000 Units by mouth daily.    Yes Historical Provider, MD  ferrous sulfate 325 (65 FE) MG tablet Take  325 mg by mouth daily.   Yes Historical Provider, MD  folic acid (FOLVITE) 800 MCG tablet Take 800 mcg by mouth daily.    Yes Historical Provider, MD  gabapentin (NEURONTIN) 100 MG capsule Take 100 mg by mouth every evening.   Yes Historical Provider, MD  hydrALAZINE (APRESOLINE) 25 MG tablet Take 25 mg by mouth 2 (two) times daily. 12/13/15  Yes Historical Provider, MD  megestrol (MEGACE) 40 MG/ML suspension Take 400 mg by mouth every evening.   Yes Historical Provider, MD  Memantine HCl ER (NAMENDA XR) 28 MG CP24 Take 28 mg by mouth daily. Patient taking differently: Take 28 mg by mouth every evening.  08/05/13  Yes Levert Feinstein, MD  metoprolol tartrate (LOPRESSOR) 25 MG tablet Take 37.5 mg by mouth daily.   Yes Historical Provider, MD  Multiple Vitamin (MULTIVITAMIN) capsule Take 1 capsule by mouth daily.   Yes Historical Provider, MD  OVER THE COUNTER MEDICATION Take 80 mg by mouth daily. Zinc 80mg    Yes Historical Provider, MD  oxybutynin (DITROPAN) 5 MG tablet Take 5 mg by mouth 2 (two) times daily.    Yes Historical Provider, MD  polyvinyl alcohol (LIQUIFILM TEARS) 1.4 % ophthalmic solution Place 1 drop into both eyes 2 (two) times daily.   Yes Historical Provider, MD  senna (SENOKOT) 8.6 MG TABS tablet Take 1-2 tablets (8.6-17.2 mg total) by mouth at bedtime. 01/04/16  Yes Calvert Cantor, MD  bisacodyl (BISACODYL) 5 MG EC tablet Take 1-2 tablets (5-10 mg total) by mouth daily as needed for moderate constipation. Patient not taking: Reported on 03/07/2016 01/04/16   Calvert Cantor, MD     Allergies:     Allergies  Allergen Reactions  . Sulfa Antibiotics Other (See Comments)    Unknown reaction      Physical Exam:   Vitals  Blood pressure 106/69, pulse (!) 129, temperature 98.5 F (36.9 C), temperature source Oral, resp. rate 19, SpO2 96 %.   1. General Frail elderly female lying in bed in NAD,    2. Awake, alert oriented 1, answering simple question, pleasant but confused .  3. No  F.N deficits, ALL C.Nerves Intact, Motor appears to be grossly intact, Plantars down going.  4. Ears and Eyes appear Normal, Conjunctivae clear, PERRLADry Oral Mucosa.  5. Supple Neck, No JVD, No cervical lymphadenopathy appriciated, No Carotid Bruits.  6. Symmetrical Chest wall movement, Good air  movement bilaterally, CTAB.  7. Irregular irregular, tachycardic, No Gallops, Rubs or Murmurs, No Parasternal Heave.  8. Positive Bowel Sounds, Abdomen Soft, No tenderness, No organomegaly appriciated,No rebound -guarding or rigidity.  9.  No Cyanosis, delayed Skin Turgor, No Skin Rash or Bruise.  10.   joints appear normal , no effusions, Normal ROM.  11. No Palpable Lymph Nodes in Neck or Axillae     Data Review:    CBC  Recent Labs Lab 03/07/16 1157  WBC 7.9  HGB 9.4*  HCT 29.5*  PLT 239  MCV 96.7  MCH 30.8  MCHC 31.9  RDW 15.9*   ------------------------------------------------------------------------------------------------------------------  Chemistries   Recent Labs Lab 03/07/16 1157  NA 139  K 5.8*  CL 110  CO2 20*  GLUCOSE 198*  BUN 55*  CREATININE 3.65*  CALCIUM 8.8*  AST 25  ALT 18  ALKPHOS 59  BILITOT 0.4   ------------------------------------------------------------------------------------------------------------------ CrCl cannot be calculated (Unknown ideal weight.). ------------------------------------------------------------------------------------------------------------------ No results for input(s): TSH, T4TOTAL, T3FREE, THYROIDAB in the last 72 hours.  Invalid input(s): FREET3  Coagulation profile No results for input(s): INR, PROTIME in the last 168 hours. ------------------------------------------------------------------------------------------------------------------- No results for input(s): DDIMER in the last 72  hours. -------------------------------------------------------------------------------------------------------------------  Cardiac Enzymes No results for input(s): CKMB, TROPONINI, MYOGLOBIN in the last 168 hours.  Invalid input(s): CK ------------------------------------------------------------------------------------------------------------------    Component Value Date/Time   BNP 719.0 (H) 05/31/2015 1304     ---------------------------------------------------------------------------------------------------------------  Urinalysis    Component Value Date/Time   COLORURINE YELLOW 03/07/2016 1551   APPEARANCEUR CLOUDY (A) 03/07/2016 1551   LABSPEC 1.018 03/07/2016 1551   PHURINE 6.5 03/07/2016 1551   GLUCOSEU NEGATIVE 03/07/2016 1551   HGBUR LARGE (A) 03/07/2016 1551   BILIRUBINUR NEGATIVE 03/07/2016 1551   KETONESUR NEGATIVE 03/07/2016 1551   PROTEINUR 100 (A) 03/07/2016 1551   NITRITE NEGATIVE 03/07/2016 1551   LEUKOCYTESUR LARGE (A) 03/07/2016 1551    ----------------------------------------------------------------------------------------------------------------   Imaging Results:    Dg Chest 2 View  Result Date: 03/07/2016 CLINICAL DATA:  Altered mental status, lethargy for the past 3 days. EXAM: CHEST  2 VIEW COMPARISON:  PA and lateral chest x-ray of January 04, 2016 FINDINGS: The lungs are adequately inflated. There is no focal infiltrate. There is no pleural effusion. The cardiac silhouette remains enlarged. The pulmonary vascularity is prominent centrally but there is no cephalization of the vascular pattern. There is calcification in the wall of the aortic arch. The mediastinum is normal in width. There is no pleural effusion. The bony thorax exhibits no acute abnormality. There degenerative changes of both shoulders. IMPRESSION: Cardiomegaly without pulmonary edema.  No acute pneumonia. Thoracic aortic atherosclerosis. Electronically Signed   By: David  [place]  M.D.   On: 03/07/2016 14:06   Ct Head Wo Contrast  Result Date: 03/07/2016 CLINICAL DATA:  Altered mental status, weakness and lethargy for 3 days. EXAM: CT HEAD WITHOUT CONTRAST TECHNIQUE: Contiguous axial images were obtained from the base of the skull through the vertex without intravenous contrast. COMPARISON:  05/30/2015 FINDINGS: Brain: Stable age related cerebral atrophy, ventriculomegaly and periventricular white matter disease. New area of low attenuation in the left occipital lobe consistent with an infarct. This does not appear acute and could be subacute or chronic. No extra-axial fluid collections. No intracranial hemorrhage. No mass lesions. Vascular: Stable extensive vascular calcifications but no aneurysm or hyperdense vessels. Skull: No skull fracture or bone lesion. Sinuses/Orbits: Extensive paranasal sinus disease with possible sinonasal polyposis. Evidence of prior sinus surgery. The globes  are intact. Other: No scalp lesions. IMPRESSION: Left occipital infarct is new since the prior CT from February 2017 but does not appear acute. It is probably subacute or chronic. No intracranial hemorrhage or mass lesion. Chronic sinus disease with possible sinonasal polyposis. Electronically Signed   By: Rudie Meyer M.D.   On: 03/07/2016 14:35    My personal review of EKG: A. fib with RVR, Rate  132 /min, QTc 430 , no Acute ST changes   Assessment & Plan:    Active Problems:   Rheumatoid arthritis (HCC)   Acute on chronic renal failure (HCC)   Dementia   UTI (urinary tract infection)   Acute encephalopathy   Atrial fibrillation (HCC)   CVA (cerebral vascular accident) (HCC)  Acute encephalopathy - This is most likely metabolic encephalopathy in the setting of UTI, patient recently treated 3 in the last 2 months in outpatient setting for UTI. - CT head significant for subacute/chronic CVA.  UTI - Continue with Rocephin, follow on urine cultures  New onset atrial fibrillation,  with RVR - Patient with known history of tachybrady syndrome, seen by Dr. Katrinka Blazing, in A. fib with RVR, A. fib or persistent utilized on admission, cycle troponins, check TSH, check echo, will start on Cardizem drip, will start on heparin GTT given chads2Vasc score of 5, plan discussed briefly with cardiology, will call official consult in a.m.Marland Kitchen  CVA - CT head with subacute/chronic CVA, will obtain MRI brain for further evaluation, we'll obtain 2-D echo, will check lipid panel, hemoglobin A1c and carotid Doppler.  Elevated troponins - Chronically elevated, she denies any chest pain or shortness of breath, most likely in the setting of acute on chronic renal failure, A. fib with RVR and infection, we'll check 2-D echo, cycle cardiac enzymes, and monitor on telemetry  History of present illness on CK D stage III - We'll hold Celebrex, hold nephrotoxic medication, start with gentle hydration  Dementia - Continue with supportive care - Continue with Namenda  Rheumatoid arthritis - Patient on Humira, will continue to hold during hospital stay    DVT Prophylaxis  SCDs ,Heparin GTT  AM Labs Ordered, also please review Full Orders  Family Communication: Admission, patients condition and plan of care including tests being ordered have been discussed with the patient and Daughter who indicate understanding and agree with the plan and Code Status.  Code Status Full  Likely DC to  Pending PT  Condition GUARDED    Consults called: D/W cards via phone, please call official consult in am.  Admission status: Inpatient  Time spent in minutes : 65 minutes   Kyi Romanello M.D on 03/07/2016 at 6:06 PM  Between 7am to 7pm - Pager - [phone]. After 7pm go to [email or web address] - password [facility]  [facility] - Office  [phone]

## 2016-03-08 ENCOUNTER — Inpatient Hospital Stay (HOSPITAL_COMMUNITY): Payer: Medicare Other

## 2016-03-08 ENCOUNTER — Encounter (HOSPITAL_COMMUNITY): Payer: Self-pay | Admitting: Cardiology

## 2016-03-08 DIAGNOSIS — I4891 Unspecified atrial fibrillation: Secondary | ICD-10-CM

## 2016-03-08 DIAGNOSIS — R748 Abnormal levels of other serum enzymes: Secondary | ICD-10-CM

## 2016-03-08 DIAGNOSIS — I10 Essential (primary) hypertension: Secondary | ICD-10-CM

## 2016-03-08 DIAGNOSIS — N178 Other acute kidney failure: Secondary | ICD-10-CM

## 2016-03-08 DIAGNOSIS — I481 Persistent atrial fibrillation: Secondary | ICD-10-CM

## 2016-03-08 DIAGNOSIS — E875 Hyperkalemia: Secondary | ICD-10-CM

## 2016-03-08 LAB — HEPARIN LEVEL (UNFRACTIONATED)
HEPARIN UNFRACTIONATED: 0.47 [IU]/mL (ref 0.30–0.70)
Heparin Unfractionated: 0.65 IU/mL (ref 0.30–0.70)

## 2016-03-08 LAB — BASIC METABOLIC PANEL
Anion gap: 7 (ref 5–15)
BUN: 47 mg/dL — AB (ref 6–20)
CO2: 21 mmol/L — ABNORMAL LOW (ref 22–32)
CREATININE: 2.57 mg/dL — AB (ref 0.44–1.00)
Calcium: 7.8 mg/dL — ABNORMAL LOW (ref 8.9–10.3)
Chloride: 113 mmol/L — ABNORMAL HIGH (ref 101–111)
GFR calc Af Amer: 18 mL/min — ABNORMAL LOW (ref >60)
GFR, EST NON AFRICAN AMERICAN: 16 mL/min — AB (ref >60)
Glucose, Bld: 144 mg/dL — ABNORMAL HIGH (ref 65–99)
Potassium: 4.9 mmol/L (ref 3.5–5.1)
SODIUM: 141 mmol/L (ref 135–145)

## 2016-03-08 LAB — MAGNESIUM: MAGNESIUM: 2 mg/dL (ref 1.7–2.4)

## 2016-03-08 LAB — ECHOCARDIOGRAM COMPLETE
Height: 69 in
WEIGHTICAEL: 2620.83 [oz_av]

## 2016-03-08 LAB — CBC
HCT: 25.1 % — ABNORMAL LOW (ref 36.0–46.0)
HEMOGLOBIN: 8.2 g/dL — AB (ref 12.0–15.0)
MCH: 31.9 pg (ref 26.0–34.0)
MCHC: 32.7 g/dL (ref 30.0–36.0)
MCV: 97.7 fL (ref 78.0–100.0)
Platelets: 239 10*3/uL (ref 150–400)
RBC: 2.57 MIL/uL — ABNORMAL LOW (ref 3.87–5.11)
RDW: 16.1 % — ABNORMAL HIGH (ref 11.5–15.5)
WBC: 8.1 10*3/uL (ref 4.0–10.5)

## 2016-03-08 LAB — URINE CULTURE: CULTURE: NO GROWTH

## 2016-03-08 LAB — TROPONIN I: Troponin I: 0.18 ng/mL (ref <0.03)

## 2016-03-08 MED ORDER — ORAL CARE MOUTH RINSE: 15.0000 mL | Freq: Two times a day (BID) | OROMUCOSAL | Status: DC

## 2016-03-08 MED ORDER — SODIUM CHLORIDE 0.9% FLUSH
10.0000 mL | Freq: Two times a day (BID) | INTRAVENOUS | Status: DC
  Administered 2016-03-09: 30 mL
  Administered 2016-03-09 – 2016-03-18 (×8): 10 mL

## 2016-03-08 MED ORDER — SODIUM CHLORIDE 0.9% FLUSH
10.0000 mL | INTRAVENOUS | Status: DC | PRN
  Administered 2016-03-16: 10 mL
  Administered 2016-03-17 (×2): 20 mL
  Administered 2016-03-18: 10 mL
  Administered 2016-03-19 – 2016-03-20 (×3): 20 mL
  Filled 2016-03-08 (×7): qty 40

## 2016-03-08 MED ORDER — CHLORHEXIDINE GLUCONATE 0.12 % MT SOLN
15.0000 mL | Freq: Two times a day (BID) | OROMUCOSAL | Status: DC
Start: 2016-03-08 — End: 2016-03-08

## 2016-03-08 NOTE — Progress Notes (Signed)
ANTICOAGULATION CONSULT NOTE - Follow Up Consult  Pharmacy Consult for Heparin Indication: atrial fibrillation  Allergies  Allergen Reactions  . Sulfa Antibiotics Other (See Comments)    Unknown reaction     Patient Measurements: Height: 5\' 9"  (175.3 cm) Weight: 163 lb 12.8 oz (74.3 kg) IBW/kg (Calculated) : 66.2 Heparin Dosing Weight:   Vital Signs: Temp: 97.2 F (36.2 C) (11/15 0000) Temp Source: Axillary (11/15 0000) BP: 123/97 (11/15 0345) Pulse Rate: 70 (11/15 0000)  Labs:  Recent Labs  03/07/16 1157 03/07/16 1818 03/07/16 2155  HGB 9.4*  --   --   HCT 29.5*  --   --   PLT 239  --   --   APTT  --   --  72*  LABPROT  --  16.4*  --   INR  --  1.31  --   CREATININE 3.65*  --   --   TROPONINI  --   --  0.18*    Estimated Creatinine Clearance: 11.1 mL/min (by C-G formula based on SCr of 3.65 mg/dL (H)).   Medications:  Infusions:  . sodium chloride 75 mL/hr at 03/08/16 0000  . diltiazem (CARDIZEM) infusion Stopped (03/07/16 2256)  . heparin 900 Units/hr (03/08/16 0000)    Assessment: Patient with heparin level ordered but per RN, not able to obtain blood.    Goal of Therapy:  Heparin level 0.3-0.7 units/ml Monitor platelets by anticoagulation protocol: Yes   Plan:  F/u with plans to obtain labs Continue heparin at current rate until plan decided.  03/10/16, Darlina Guys Crowford 03/08/2016,4:51 AM

## 2016-03-08 NOTE — Progress Notes (Signed)
ANTICOAGULATION CONSULT NOTE - Follow Up Consult  Pharmacy Consult for Heparin Indication: atrial fibrillation  Allergies  Allergen Reactions  . Sulfa Antibiotics Other (See Comments)    Unknown reaction     Patient Measurements: Height: 5\' 9"  (175.3 cm) Weight: 163 lb 12.8 oz (74.3 kg) IBW/kg (Calculated) : 66.2 Heparin Dosing Weight: actual weight 74 kg  Vital Signs: Temp: 98.5 F (36.9 C) (11/15 1130) Temp Source: Axillary (11/15 1130) BP: 110/70 (11/15 1100) Pulse Rate: 81 (11/15 1100)  Labs:  Recent Labs  03/07/16 1157 03/07/16 1818 03/07/16 2155  HGB 9.4*  --   --   HCT 29.5*  --   --   PLT 239  --   --   APTT  --   --  72*  LABPROT  --  16.4*  --   INR  --  1.31  --   CREATININE 3.65*  --   --   TROPONINI  --   --  0.18*    Estimated Creatinine Clearance: 11.1 mL/min (by C-G formula based on SCr of 3.65 mg/dL (H)).   Medications:  Infusions:  . sodium chloride 75 mL/hr at 03/08/16 1147  . diltiazem (CARDIZEM) infusion 7.5 mg/hr (03/08/16 1100)  . heparin 900 Units/hr (03/08/16 1100)    Assessment: 31 yoF admitted on 11/14 with AMS, UTI, and noted to have Afib and RVR.  PMH includes tachy-brady syndrome, PSVT, elevated troponin, CVA, high risk for falls d/t syncope.  No PTA anticoagulants noted.  Pharmacy is consulted to dose Heparin IV.  Today, 03/08/2016:  Heparin level 0.65 is therapeutic (0400 level not drawn d/t inability to obtain labs, first level obtained ~1400).  CBC: Hgb decreased (9.4 > 8.2) and Plt remain WNL.  No bleeding or complications reported.   Goal of Therapy:  Heparin level 0.3-0.7 units/ml Monitor platelets by anticoagulation protocol: Yes   Plan:   Continue heparin IV infusion at 9 units/hr  Heparin level in 8 hours to confirm therapeutic rate  Daily heparin level and CBC  Follow up long-term anticoagulation plans.   03/10/2016 PharmD, BCPS Pager [phone] 03/08/2016 12:01 PM

## 2016-03-08 NOTE — Progress Notes (Signed)
Pt came up from the ED on cardizem gtt. Gave PO metroprolol and hydralazine at 2200 and pt BP dropped to 85/50 and HR 80's. Stopped cardizem gtt and pt BP increased to 112/85 within 15 minutes. Has been off cardizem for remainder of night, no sustained HR >110.   Will continue to monitor.

## 2016-03-08 NOTE — Care Management Note (Signed)
Case Management Note  Patient Details  Name: Darlene Daniels MRN: [id number] Date of Birth: 1925-02-02  Subjective/Objective:      A. Fib with RVR and iv Cardizem drip              Action/Plan: from home Date:  March 08, 2016 Chart reviewed for concurrent status and case management needs. Will continue to follow patient progress. Discharge Planning: following for needs Expected discharge date: [id number] Marcelle Smiling, BSN, [place], [place]   [phone]   Expected Discharge Date:   (unknown)               Expected Discharge Plan:  Home/Self Care  In-House Referral:  Clinical Social Work  Discharge planning Services  CM Consult  Post Acute Care Choice:    Choice offered to:     DME Arranged:    DME Agency:     HH Arranged:    HH Agency:     Status of Service:  In process, will continue to follow  If discussed at Long Length of Stay Meetings, dates discussed:    Additional Comments:  Golda Acre, RN 03/08/2016, 9:54 AM

## 2016-03-08 NOTE — Consult Note (Signed)
Reason for Consult: a fib with RVR   Referring Physician: Dr. Clementeen Graham   PCP:  Salena Saner., MD  Primary Cardiologist:Dr. Ryane Konieczny is an 82 y.o. female.    Chief Complaint:  Admitted 03/07/16 for AMS and lethargy   HPI:  Asked to see 89yof for a fib with RVR.   She has a hx of PSVT and tachybrady syndrome and had been stable on metoprolol 37.5 daily- has not seen Dr. Tamala Julian since 2014.  She has dementia, HTN, urinary incontinence with hx of recent UTIs as well.   Reviewing chart in Feb of this year admitted for syncope X 3.  In Feb after syncope and no pulse per family CPR for 1-2 min and pt woke.  Her HR was 40-50s in ER.  At discharge dx with orthostatic hypotension.  07/2015 with ER visit due to hypotension.  EKG with HR 58.  Again 12/2015 with admit for syncope hypotension using bathroom.  BP down to 90/50- noted to have low HR per ER note. EKG at 53.     Yesterday pt presented with AMS and lethargy.  She had +UTI and found to be in a fib with RVR.   Also with acute renal failure with Cr at 3.6 previously 1.98 to 2 at highest.  She has poor appetite with food and fluids.  CT head with subacute/chronic CVA .  Tropoin is elevated at 0.27 and 0.18- but in Sept they were also elevated to pk 0.39.  In April troponin was 0.03.  In Feb with syncope pk troponin 0.08.  K+ elevated at 5.8 on admit and rec'd kayexalate.  Hgb is 9.4   Pt was started on IV heparin and IV dilt, her BP did drop to 85 systolic and dilt drip was stopped.  now has been resumed with increase of HR and HTN.   Currently no complaints, no chest pain, no SOB.  Her health care aid stated her HR has been elevated recently and it appears her BB had been decreased to 25 mg.  Now back to 37.5.   In Sept 2017  Echo with EF 55-60%, aortic valve with calcified non coronary cusp.  LA normal in size.  Carotid dopplers with 1-39 % bil Carotid disease.   Past Medical History:  Diagnosis Date  . Abnormality  of gait   . Arthritis   . Memory loss   . Other persistent mental disorders due to conditions classified elsewhere   . Palpitations   . Paroxysmal tachycardia, unspecified   . Rheumatoid arthritis ([place])   . Tachycardia   . Urinary incontinence     History reviewed. No pertinent surgical history.  Family History  Problem Relation Age of Onset  . Breast cancer Daughter   . Diabetes Daughter    Social History:  reports that she has never smoked. She has never used smokeless tobacco. She reports that she does not drink alcohol or use drugs.  Allergies:  Allergies  Allergen Reactions  . Sulfa Antibiotics Other (See Comments)    Unknown reaction     OUTPATIENT MEDICATIONS: No current facility-administered medications on file prior to encounter.    Current Outpatient Prescriptions on File Prior to Encounter  Medication Sig Dispense Refill  . Adalimumab (HUMIRA) 40 MG/0.8ML PSKT Inject 40 mg into the skin every 14 (fourteen) days.    . Ascorbic Acid (VITAMIN C) 1000 MG tablet Take 1,000 mg by mouth daily.    Marland Kitchen  b complex vitamins tablet Take 1 tablet by mouth daily.    . celecoxib (CELEBREX) 200 MG capsule Take 200 mg by mouth 2 (two) times daily.     . Cholecalciferol (VITAMIN D3) 5000 units TABS Take 5,000 Units by mouth daily.     . ferrous sulfate 325 (65 FE) MG tablet Take 325 mg by mouth daily.    . folic acid (FOLVITE) 800 MCG tablet Take 800 mcg by mouth daily.     Marland Kitchen gabapentin (NEURONTIN) 100 MG capsule Take 100 mg by mouth every evening.    . hydrALAZINE (APRESOLINE) 25 MG tablet Take 25 mg by mouth 2 (two) times daily.    . megestrol (MEGACE) 40 MG/ML suspension Take 400 mg by mouth every evening.    . Memantine HCl ER (NAMENDA XR) 28 MG CP24 Take 28 mg by mouth daily. (Patient taking differently: Take 28 mg by mouth every evening. ) 30 capsule 1  . Multiple Vitamin (MULTIVITAMIN) capsule Take 1 capsule by mouth daily.    Marland Kitchen oxybutynin (DITROPAN) 5 MG tablet Take 5 mg by  mouth 2 (two) times daily.     Marland Kitchen senna (SENOKOT) 8.6 MG TABS tablet Take 1-2 tablets (8.6-17.2 mg total) by mouth at bedtime. 120 each 0  . bisacodyl (BISACODYL) 5 MG EC tablet Take 1-2 tablets (5-10 mg total) by mouth daily as needed for moderate constipation. (Patient not taking: Reported on 03/07/2016) 30 tablet 0   CURRENT MEDICATIONS: Scheduled Meds: . B-complex with vitamin C  1 tablet Oral Daily  . cefTRIAXone (ROCEPHIN)  IV  1 g Intravenous Q24H  . cholecalciferol  5,000 Units Oral Daily  . ferrous sulfate  325 mg Oral QPC breakfast  . folic acid  1 mg Oral Daily  . gabapentin  100 mg Oral QPM  . hydrALAZINE  25 mg Oral BID  . megestrol  400 mg Oral QPM  . memantine  28 mg Oral QPM  . metoprolol tartrate  37.5 mg Oral Daily  . multivitamin with minerals  1 tablet Oral Daily  . oxybutynin  5 mg Oral BID  . polyvinyl alcohol  1 drop Both Eyes BID  . senna  1-2 tablet Oral QHS  . sodium chloride  500 mL Intravenous Once  . vitamin C  1,000 mg Oral Daily   Continuous Infusions: . sodium chloride 75 mL/hr at 03/08/16 0800  . diltiazem (CARDIZEM) infusion 10 mg/hr (03/08/16 0800)  . heparin 900 Units/hr (03/08/16 0800)   PRN Meds:.acetaminophen **OR** acetaminophen   Results for orders placed or performed during the hospital encounter of 03/07/16 (from the past 48 hour(s))  Comprehensive metabolic panel     Status: Abnormal   Collection Time: 03/07/16 11:57 AM  Result Value Ref Range   Sodium 139 135 - 145 mmol/L   Potassium 5.8 (H) 3.5 - 5.1 mmol/L   Chloride 110 101 - 111 mmol/L   CO2 20 (L) 22 - 32 mmol/L   Glucose, Bld 198 (H) 65 - 99 mg/dL   BUN 55 (H) 6 - 20 mg/dL   Creatinine, Ser 7.19 (H) 0.44 - 1.00 mg/dL   Calcium 8.8 (L) 8.9 - 10.3 mg/dL   Total Protein 6.7 6.5 - 8.1 g/dL   Albumin 2.4 (L) 3.5 - 5.0 g/dL   AST 25 15 - 41 U/L   ALT 18 14 - 54 U/L   Alkaline Phosphatase 59 38 - 126 U/L   Total Bilirubin 0.4 0.3 - 1.2 mg/dL   GFR calc  non Af Amer 10 (L) >60  mL/min   GFR calc Af Amer 12 (L) >60 mL/min    Comment: (NOTE) The eGFR has been calculated using the CKD EPI equation. This calculation has not been validated in all clinical situations. eGFR's persistently <60 mL/min signify possible Chronic Kidney Disease.    Anion gap 9 5 - 15  CBC     Status: Abnormal   Collection Time: 03/07/16 11:57 AM  Result Value Ref Range   WBC 7.9 4.0 - 10.5 K/uL   RBC 3.05 (L) 3.87 - 5.11 MIL/uL   Hemoglobin 9.4 (L) 12.0 - 15.0 g/dL   HCT 29.5 (L) 36.0 - 46.0 %   MCV 96.7 78.0 - 100.0 fL   MCH 30.8 26.0 - 34.0 pg   MCHC 31.9 30.0 - 36.0 g/dL   RDW 15.9 (H) 11.5 - 15.5 %   Platelets 239 150 - 400 K/uL  CBG monitoring, ED     Status: Abnormal   Collection Time: 03/07/16 12:16 PM  Result Value Ref Range   Glucose-Capillary 152 (H) 65 - 99 mg/dL  Ammonia     Status: None   Collection Time: 03/07/16  2:33 PM  Result Value Ref Range   Ammonia 9 9 - 35 umol/L  I-Stat Troponin, ED (not at [facility])     Status: Abnormal   Collection Time: 03/07/16  2:44 PM  Result Value Ref Range   Troponin i, poc 0.27 (HH) 0.00 - 0.08 ng/mL   Comment NOTIFIED PHYSICIAN    Comment 3            Comment: Due to the release kinetics of cTnI, a negative result within the first hours of the onset of symptoms does not rule out myocardial infarction with certainty. If myocardial infarction is still suspected, repeat the test at appropriate intervals.   I-Stat CG4 Lactic Acid, ED     Status: Abnormal   Collection Time: 03/07/16  2:47 PM  Result Value Ref Range   Lactic Acid, Venous 2.53 (HH) 0.5 - 1.9 mmol/L   Comment NOTIFIED PHYSICIAN   Urinalysis, Routine w reflex microscopic (not at [facility])     Status: Abnormal   Collection Time: 03/07/16  3:51 PM  Result Value Ref Range   Color, Urine YELLOW YELLOW   APPearance CLOUDY (A) CLEAR   Specific Gravity, Urine 1.018 1.005 - 1.030   pH 6.5 5.0 - 8.0   Glucose, UA NEGATIVE NEGATIVE mg/dL   Hgb urine dipstick LARGE (A) NEGATIVE    Bilirubin Urine NEGATIVE NEGATIVE   Ketones, ur NEGATIVE NEGATIVE mg/dL   Protein, ur 100 (A) NEGATIVE mg/dL   Nitrite NEGATIVE NEGATIVE   Leukocytes, UA LARGE (A) NEGATIVE  Urine microscopic-add on     Status: Abnormal   Collection Time: 03/07/16  3:51 PM  Result Value Ref Range   Squamous Epithelial / LPF 0-5 (A) NONE SEEN   WBC, UA TOO NUMEROUS TO COUNT 0 - 5 WBC/hpf   RBC / HPF TOO NUMEROUS TO COUNT 0 - 5 RBC/hpf   Bacteria, UA MANY (A) NONE SEEN  I-Stat CG4 Lactic Acid, ED     Status: Abnormal   Collection Time: 03/07/16  5:45 PM  Result Value Ref Range   Lactic Acid, Venous 1.93 (HH) 0.5 - 1.9 mmol/L  Protime-INR     Status: Abnormal   Collection Time: 03/07/16  6:18 PM  Result Value Ref Range   Prothrombin Time 16.4 (H) 11.4 - 15.2 seconds   INR 1.31  MRSA PCR Screening     Status: None   Collection Time: 03/07/16  8:58 PM  Result Value Ref Range   MRSA by PCR NEGATIVE NEGATIVE    Comment:        The GeneXpert MRSA Assay (FDA approved for NASAL specimens only), is one component of a comprehensive MRSA colonization surveillance program. It is not intended to diagnose MRSA infection nor to guide or monitor treatment for MRSA infections.   APTT     Status: Abnormal   Collection Time: 03/07/16  9:55 PM  Result Value Ref Range   aPTT 72 (H) 24 - 36 seconds    Comment:        IF BASELINE aPTT IS ELEVATED, SUGGEST PATIENT RISK ASSESSMENT BE USED TO DETERMINE APPROPRIATE ANTICOAGULANT THERAPY.   Troponin I (q 6hr x 3)     Status: Abnormal   Collection Time: 03/07/16  9:55 PM  Result Value Ref Range   Troponin I 0.18 (HH) <0.03 ng/mL    Comment: CRITICAL VALUE NOTED.  VALUE IS CONSISTENT WITH PREVIOUSLY REPORTED AND CALLED VALUE. CRITICAL RESULT CALLED TO, READ BACK BY AND VERIFIED WITH: MCNABB,B. RN AT 0803 03/08/16 MULLINS,T    Dg Chest 2 View  Result Date: 03/07/2016 CLINICAL DATA:  Altered mental status, lethargy for the past 3 days. EXAM: CHEST  2 VIEW  COMPARISON:  PA and lateral chest x-ray of January 04, 2016 FINDINGS: The lungs are adequately inflated. There is no focal infiltrate. There is no pleural effusion. The cardiac silhouette remains enlarged. The pulmonary vascularity is prominent centrally but there is no cephalization of the vascular pattern. There is calcification in the wall of the aortic arch. The mediastinum is normal in width. There is no pleural effusion. The bony thorax exhibits no acute abnormality. There degenerative changes of both shoulders. IMPRESSION: Cardiomegaly without pulmonary edema.  No acute pneumonia. Thoracic aortic atherosclerosis. Electronically Signed   By: David  Martinique M.D.   On: 03/07/2016 14:06   Ct Head Wo Contrast  Result Date: 03/07/2016 CLINICAL DATA:  Altered mental status, weakness and lethargy for 3 days. EXAM: CT HEAD WITHOUT CONTRAST TECHNIQUE: Contiguous axial images were obtained from the base of the skull through the vertex without intravenous contrast. COMPARISON:  05/30/2015 FINDINGS: Brain: Stable age related cerebral atrophy, ventriculomegaly and periventricular white matter disease. New area of low attenuation in the left occipital lobe consistent with an infarct. This does not appear acute and could be subacute or chronic. No extra-axial fluid collections. No intracranial hemorrhage. No mass lesions. Vascular: Stable extensive vascular calcifications but no aneurysm or hyperdense vessels. Skull: No skull fracture or bone lesion. Sinuses/Orbits: Extensive paranasal sinus disease with possible sinonasal polyposis. Evidence of prior sinus surgery. The globes are intact. Other: No scalp lesions. IMPRESSION: Left occipital infarct is new since the prior CT from February 2017 but does not appear acute. It is probably subacute or chronic. No intracranial hemorrhage or mass lesion. Chronic sinus disease with possible sinonasal polyposis. Electronically Signed   By: Marijo Sanes M.D.   On: 03/07/2016  14:35    ROS: General:no colds or fevers, no weight changes ( per [place]- pt poor historian) Skin:no rashes or ulcers HEENT:no blurred vision, no congestion CV:see HPI PUL:see HPI GI:no diarrhea constipation or melena, no indigestion GU:no hematuria- now UTI treated, no dysuria MS:no joint pain, no claudication Neuro:+ syncope several episodes this year, no lightheadedness Endo:no diabetes, no thyroid disease  Blood pressure (!) 142/103, pulse (!) 38, temperature 98.9  F (37.2 C), temperature source Oral, resp. rate 18, height '5\' 9"'$  (1.753 m), weight 163 lb 12.8 oz (74.3 kg), SpO2 (!) 87 %.  Wt Readings from Last 3 Encounters:  03/07/16 163 lb 12.8 oz (74.3 kg)  01/04/16 171 lb (77.6 kg)  08/12/15 175 lb (79.4 kg)    PE: General:Pleasant affect, NAD Skin:Warm and dry, brisk capillary refill HEENT:normocephalic, sclera clear, mucus membranes moist Neck:supple, no JVD, no bruits  Heart:S1S2 RRR without murmur, gallup, rub or click Lungs:clear without rales, rhonchi, or wheezes KVQ:QVZD, non tender, + BS, do not palpate liver spleen or masses Ext:no lower ext edema, 2+ pedal pulses, 2+ radial pulses Neuro:alert could not give name of hospital or town she lives in, did not know date, MAE, follows commands with help, + facial symmetry   Assessment/Plan 1.  A fib with RVR rate controlled off of dilt IV, on IV heparin with CHA2DS2VASc of 6.  She is at high risk for falls with 3 episodes of syncope or near syncope this year.  Dr. Sallyanne Kuster to see.  Either rate control her a fib vs TEE DCCV.  May be difficult to control with hx of tachy brady syndrome  2.  Hx of tachy brady syndrome with hx of PSVT, also with HR 40-50s SR after 2 syncope episodes. --on BB  3.  Elevated troponin with acute renal failure and a fib RVR.  Though in Sept also elevated and that was with hypotension.   No prior hx of CAD normal EF on echo in Sept.  Echo ordered.  May be demand ischemia.   4.  AMS may be due  to UTI and her chronic dementia.  Per IM  5.  UTI per Im  6. Acute renal failure --per IM  7.  HTN now elevated is on metoprolol 37.5 BID  8.  Dementia   Cecilie Kicks  Nurse Practitioner Certified [place] Pager [phone] or after 5pm or weekends call [phone] 03/08/2016, 9:21 AM  I have seen and examined the patient along with Cecilie Kicks NP.  I have reviewed the chart, notes and new data.  I agree with NP's note.  Key new complaints: currently feels well and appears comfortable Key examination changes: in AF with controlled rate, 90s. BP normal Key new findings / data: echo with mildly reduced LVEF, but with pattern of global hypokinesis, maybe related to tachyarrhythmia? (normal LVEF in September); severe biatrial dilation. No significant valve problems.  PLAN: Continue with rate control strategy and avoid true antiarrhythmics. Odds of succesfull long term NSR limited by her age and atrial size and staying in NSR will avoid the previous problems she had with tachy-brady sd.  Low BP recently may have been related to UTI. Improved. Transition to oral rate control meds and oral anticoagulant (she has had a few falls on her bottom when trying to sit in a chair, bit no serious injuries). Discussed pacemaker with her daughter as a possible option, but expressed my preference for conservative management. I do not think she is an appropriate candidate for invasive coronary evaluation or revascularization.  Sanda Klein, MD, [place] [phone] 03/08/2016, 2:32 PM

## 2016-03-08 NOTE — Progress Notes (Signed)
PROGRESS NOTE                                                                                                                                                                                                             Patient Demographics:    Darlene Daniels, is a 82 y.o. female, DOB - 01-05-1925, ZOX:[id number]  Admit date - 03/07/2016   Admitting Physician Starleen Arms, MD  Outpatient Primary MD for the patient is Alva Garnet., MD  LOS - 1  Outpatient Specialists: none  Chief Complaint  Patient presents with  . Altered Mental Status       Brief Narrative  82 year old female with history of hypertension, recurrent syncope, tachycardia bradycardia syndrome, chronic kidney disease stage III, rheumatoid arthritis, orthostatic hypertension, dementia, urinary incontinence was brought to the ED by her daughter with increased confusion and lethargy for past 1 week, associated with poor appetite. Patient was diagnosed with a UTI 3 times by her PCP in the past 2 months and treated with Cipro, nitrofurantoin and Keflex. No fevers, chills, nausea, vomiting, complaining of chest pain, shortness of breath, urinary symptoms or diarrhea. In the ED she was found to be in rapid A. fib. UA positive for UTI and worsened renal function from baseline. Chest x-ray unremarkable. Head CT showing subacute/chronic left occipital infarct (new from CT since February 2017). Patient placed on Cardizem drip and heparin drip and admitted to hospitalist service in stepdown unit.   Subjective:   Patient still confused although per caregiver is more oriented.   Assessment  & Plan :   Principal problem Atrial fibrillation with RVR (HCC) New onset. Started on Cardizem and heparin drip. Cardiology consulted.CHA2DS2VASc of 6. Further recommendations pending. 2-D echo repeated with EF of 40-45% and diffuse hypokinesis (new compared to last echo  2 months back). Likely due to rapid A. fib.   Active Problems: UTI Continue empiric Rocephin. Follow cultures.  Acute metabolic encephalopathy Worsened dementia secondary to UTI. Monitor for now.  Acute on chronic kidney injury stage III Suspect secondary to dehydration and UTI. Gentle IV fluids. Check repeat electrolytes.  Hyperkalemia Check repeat labs. Monitor on telemetry.  New Systolic CHF Noted for low EF of 40-45%, Likely due to rapid A. fib. Euvolemic on exam.  Elevated troponin Chronic. No chest pain symptoms or EKG changes.   ? CVA (  cerebral vascular accident) (HCC) Subacute left occipital infarct seen on head CT. MRI brain pending.  Dementia Continue Namenda.  Rheumatoid arthritis On Celebrex. Hold given poor renal function.  Iron deficiency anemia Continue supplemental.  Essential hypertension  continue hydralazine.  Poor IV access and limitation to blood draw. Discussed options for IV access with daughter and she prefers having a PICC line or midline for blood draws. She knows that patient is not a candidate for dialysis if the need arises but does not want to declined the option at present.   Code Status : Full code (discussed with daughter at length)  Family Communication  : Spoke with daughter on the phone  Disposition Plan  : Continue step down monitoring  Barriers For Discharge : Active symptoms  Consults  :  Cardiology  Procedures  : 2-D echo  DVT Prophylaxis  : IV heparin  Lab Results  Component Value Date   PLT 239 03/07/2016    Antibiotics  :    Anti-infectives    Start     Dose/Rate Route Frequency Ordered Stop   03/08/16 1700  cefTRIAXone (ROCEPHIN) 1 g in dextrose 5 % 50 mL IVPB     1 g 100 mL/hr over 30 Minutes Intravenous Every 24 hours 03/07/16 2052     03/07/16 1645  cefTRIAXone (ROCEPHIN) 1 g in dextrose 5 % 50 mL IVPB     1 g 100 mL/hr over 30 Minutes Intravenous  Once 03/07/16 1638 03/07/16 1741        Objective:     Vitals:   03/08/16 0900 03/08/16 1000 03/08/16 1100 03/08/16 1130  BP: (!) 142/125 (!) 140/115 110/70   Pulse:  66 81   Resp: 19 17 18    Temp:    98.5 F (36.9 C)  TempSrc:    Axillary  SpO2:  95% (!) 14%   Weight:      Height:        Wt Readings from Last 3 Encounters:  03/07/16 74.3 kg (163 lb 12.8 oz)  01/04/16 77.6 kg (171 lb)  08/12/15 79.4 kg (175 lb)     Intake/Output Summary (Last 24 hours) at 03/08/16 1208 Last data filed at 03/08/16 1100  Gross per 24 hour  Intake          1265.25 ml  Output                0 ml  Net          1265.25 ml     Physical Exam  Gen: not in distress,Confused HEENT:  moist mucosa, supple neck Chest: clear b/l, no added sounds CVS: S1 and S2 irregular, no murmurs rub or gallop GI: soft, NT, ND, BS+ Musculoskeletal: warm, no edema CNS: Alert and awake, oriented x0    Data Review:    CBC  Recent Labs Lab 03/07/16 1157  WBC 7.9  HGB 9.4*  HCT 29.5*  PLT 239  MCV 96.7  MCH 30.8  MCHC 31.9  RDW 15.9*    Chemistries   Recent Labs Lab 03/07/16 1157  NA 139  K 5.8*  CL 110  CO2 20*  GLUCOSE 198*  BUN 55*  CREATININE 3.65*  CALCIUM 8.8*  AST 25  ALT 18  ALKPHOS 59  BILITOT 0.4   ------------------------------------------------------------------------------------------------------------------ No results for input(s): CHOL, HDL, LDLCALC, TRIG, CHOLHDL, LDLDIRECT in the last 72 hours.  Lab Results  Component Value Date   HGBA1C 6.3 (H) 05/30/2015   ------------------------------------------------------------------------------------------------------------------ No results for  input(s): TSH, T4TOTAL, T3FREE, THYROIDAB in the last 72 hours.  Invalid input(s): FREET3 ------------------------------------------------------------------------------------------------------------------ No results for input(s): VITAMINB12, FOLATE, FERRITIN, TIBC, IRON, RETICCTPCT in the last 72 hours.  Coagulation  profile  Recent Labs Lab 03/07/16 1818  INR 1.31    No results for input(s): DDIMER in the last 72 hours.  Cardiac Enzymes  Recent Labs Lab 03/07/16 2155  TROPONINI 0.18*   ------------------------------------------------------------------------------------------------------------------    Component Value Date/Time   BNP 719.0 (H) 05/31/2015 1304    Inpatient Medications  Scheduled Meds: . B-complex with vitamin C  1 tablet Oral Daily  . cefTRIAXone (ROCEPHIN)  IV  1 g Intravenous Q24H  . cholecalciferol  5,000 Units Oral Daily  . ferrous sulfate  325 mg Oral QPC breakfast  . folic acid  1 mg Oral Daily  . gabapentin  100 mg Oral QPM  . hydrALAZINE  25 mg Oral BID  . megestrol  400 mg Oral QPM  . memantine  28 mg Oral QPM  . metoprolol tartrate  37.5 mg Oral Daily  . multivitamin with minerals  1 tablet Oral Daily  . oxybutynin  5 mg Oral BID  . polyvinyl alcohol  1 drop Both Eyes BID  . senna  1-2 tablet Oral QHS  . sodium chloride  500 mL Intravenous Once  . vitamin C  1,000 mg Oral Daily   Continuous Infusions: . sodium chloride 75 mL/hr at 03/08/16 1147  . diltiazem (CARDIZEM) infusion 7.5 mg/hr (03/08/16 1100)  . heparin 900 Units/hr (03/08/16 1100)   PRN Meds:.acetaminophen **OR** acetaminophen  Micro Results Recent Results (from the past 240 hour(s))  MRSA PCR Screening     Status: None   Collection Time: 03/07/16  8:58 PM  Result Value Ref Range Status   MRSA by PCR NEGATIVE NEGATIVE Final    Comment:        The GeneXpert MRSA Assay (FDA approved for NASAL specimens only), is one component of a comprehensive MRSA colonization surveillance program. It is not intended to diagnose MRSA infection nor to guide or monitor treatment for MRSA infections.     Radiology Reports Dg Chest 2 View  Result Date: 03/07/2016 CLINICAL DATA:  Altered mental status, lethargy for the past 3 days. EXAM: CHEST  2 VIEW COMPARISON:  PA and lateral chest x-ray  of January 04, 2016 FINDINGS: The lungs are adequately inflated. There is no focal infiltrate. There is no pleural effusion. The cardiac silhouette remains enlarged. The pulmonary vascularity is prominent centrally but there is no cephalization of the vascular pattern. There is calcification in the wall of the aortic arch. The mediastinum is normal in width. There is no pleural effusion. The bony thorax exhibits no acute abnormality. There degenerative changes of both shoulders. IMPRESSION: Cardiomegaly without pulmonary edema.  No acute pneumonia. Thoracic aortic atherosclerosis. Electronically Signed   By: David  [place] M.D.   On: 03/07/2016 14:06   Ct Head Wo Contrast  Result Date: 03/07/2016 CLINICAL DATA:  Altered mental status, weakness and lethargy for 3 days. EXAM: CT HEAD WITHOUT CONTRAST TECHNIQUE: Contiguous axial images were obtained from the base of the skull through the vertex without intravenous contrast. COMPARISON:  05/30/2015 FINDINGS: Brain: Stable age related cerebral atrophy, ventriculomegaly and periventricular white matter disease. New area of low attenuation in the left occipital lobe consistent with an infarct. This does not appear acute and could be subacute or chronic. No extra-axial fluid collections. No intracranial hemorrhage. No mass lesions. Vascular: Stable extensive vascular calcifications but  no aneurysm or hyperdense vessels. Skull: No skull fracture or bone lesion. Sinuses/Orbits: Extensive paranasal sinus disease with possible sinonasal polyposis. Evidence of prior sinus surgery. The globes are intact. Other: No scalp lesions. IMPRESSION: Left occipital infarct is new since the prior CT from February 2017 but does not appear acute. It is probably subacute or chronic. No intracranial hemorrhage or mass lesion. Chronic sinus disease with possible sinonasal polyposis. Electronically Signed   By: Rudie MeyerP.  Gallerani M.D.   On: 03/07/2016 14:35    Time Spent in minutes   35   Eddie NorthHUNGEL, Alan Drummer M.D on 03/08/2016 at 12:08 PM  Between 7am to 7pm - Pager - [phone]  After 7pm go to [email or web address] - password [email or web address]  [facility] -  Office  [phone]

## 2016-03-08 NOTE — Progress Notes (Signed)
Multiple attempts for blood draws for labs by lab techs and RN's. Unable to obtain blood for heparin level. Notified K. Schorr, received orders to place PICC via IV team.

## 2016-03-08 NOTE — Progress Notes (Signed)
Echocardiogram 2D Echocardiogram has been performed.  Darlene Daniels 03/08/2016, 9:54 AM

## 2016-03-08 NOTE — Progress Notes (Signed)
Peripherally Inserted Central Catheter/Midline Placement  The IV Nurse has discussed with the patient and/or persons authorized to consent for the patient, the purpose of this procedure and the potential benefits and risks involved with this procedure.  The benefits include less needle sticks, lab draws from the catheter, and the patient may be discharged home with the catheter. Risks include, but not limited to, infection, bleeding, blood clot (thrombus formation), and puncture of an artery; nerve damage and irregular heartbeat and possibility to perform a PICC exchange if needed/ordered by physician.  Alternatives to this procedure were also discussed.  Bard Power PICC patient education guide, fact sheet on infection prevention and patient information card has been provided to patient /or left at bedside.    PICC/Midline Placement Documentation  PICC Double Lumen 03/08/16 PICC Right Brachial 35 cm 0 cm (Active)  Indication for Insertion or Continuance of Line Poor Vasculature-patient has had multiple peripheral attempts or PIVs lasting less than 24 hours 03/08/2016 12:00 PM  Exposed Catheter (cm) 0 cm 03/08/2016 12:00 PM  Dressing Change Due 03/08/16 03/08/2016 12:00 PM       [name] 03/08/2016, 12:40 PM

## 2016-03-08 NOTE — Progress Notes (Signed)
I spoke to the nurse and asked that she address the low CrCl with MD and get renal clearance. If renal clearance is not obtained, I suggested a subclavian IJ central line be considered. Nurse will update IV team. Consuello Masse

## 2016-03-08 NOTE — Evaluation (Signed)
Physical Therapy Evaluation Patient Details Name: Darlene Daniels MRN: [id number] DOB: 11/08/1924 Today's Date: 03/08/2016   History of Present Illness  82 yo female admitted with UTI. Imaging showed a subacute/chronic CVA during this admission. Hx of dementia, incontinence, RA, tachycardia.   Clinical Impression  On eval, pt required Mod assist +2 for mobility. She was able to stand x 2 at EOB then pivot from bed to recliner with a RW. Daughter present during session. Plan is for pt to return home with continued 24 hour care between caregivers and family. Will follow and progress activity as tolerated.     Follow Up Recommendations Home health PT;Supervision/Assistance - 24 hour    Equipment Recommendations  None recommended by PT    Recommendations for Other Services       Precautions / Restrictions Precautions Precautions: Fall Restrictions Weight Bearing Restrictions: No      Mobility  Bed Mobility Overal bed mobility: Needs Assistance Bed Mobility: Supine to Sit     Supine to sit: Mod assist;+2 for physical assistance;+2 for safety/equipment;HOB elevated     General bed mobility comments: Assist for trunk and LEs. Multimodal cues required. Daughter assisted with encouraging pt.  Transfers Overall transfer level: Needs assistance   Transfers: Sit to/from Stand;Stand Pivot Transfers Sit to Stand: Mod assist;+2 physical assistance;+2 safety/equipment Stand pivot transfers: Mod assist;+2 physical assistance;+2 safety/equipment       General transfer comment: x2. Multimodal cues required. Placed pt's hands on walker handles. Assist to rise, stabilize, control descent. Stand pivot, bed to recliner, with RW  Ambulation/Gait                Stairs            Wheelchair Mobility    Modified Rankin (Stroke Patients Only)       Balance Overall balance assessment: Needs assistance           Standing balance-Leahy Scale: Poor                               Pertinent Vitals/Pain Pain Assessment: No/denies pain    Home Living Family/patient expects to be discharged to:: Private residence Living Arrangements: Children Available Help at Discharge: Personal care attendant;Available 24 hours/day Type of Home: House         Home Equipment: Walker - 4 wheels;Wheelchair - [employer];Hospital bed Additional Comments: Pt has personal care attendant and family w/ her 24/7.    Prior Function Level of Independence: Needs assistance   Gait / Transfers Assistance Needed: Uses rollator to ambulate short distances in home.  Requires cues for posture for safety.           Hand Dominance        Extremity/Trunk Assessment   Upper Extremity Assessment: Generalized weakness           Lower Extremity Assessment: Generalized weakness      Cervical / Trunk Assessment: Kyphotic  Communication   Communication: No difficulties  Cognition Arousal/Alertness: Awake/alert Behavior During Therapy: WFL for tasks assessed/performed Overall Cognitive Status: History of cognitive impairments - at baseline                      General Comments      Exercises     Assessment/Plan    PT Assessment Patient needs continued PT services  PT Problem List Decreased strength;Decreased mobility;Pain;Decreased balance;Decreased knowledge of use of DME;Decreased activity tolerance;Decreased cognition  PT Treatment Interventions Gait training;Therapeutic activities;Therapeutic exercise;Functional mobility training;Balance training;Patient/family education;DME instruction    PT Goals (Current goals can be found in the Care Plan section)  Acute Rehab PT Goals Patient Stated Goal: home with caregivers per daughter PT Goal Formulation: With family Time For Goal Achievement: 03/22/16 Potential to Achieve Goals: Fair    Frequency Min 3X/week   Barriers to discharge        Co-evaluation                End of Session   Activity Tolerance: Patient tolerated treatment well Patient left: in chair;with call bell/phone within reach;with family/visitor present (instructed daughter to let RN know if she leaves) Nurse Communication: Mobility status (made RN aware that pt was in recliner)         Time: 1540-0867 PT Time Calculation (min) (ACUTE ONLY): 18 min   Charges:   PT Evaluation $PT Eval Low Complexity: 1 Procedure     PT G Codes:        Rebeca Alert, MPT Pager: [phone]

## 2016-03-09 ENCOUNTER — Inpatient Hospital Stay (HOSPITAL_COMMUNITY): Payer: Medicare Other

## 2016-03-09 DIAGNOSIS — N179 Acute kidney failure, unspecified: Secondary | ICD-10-CM

## 2016-03-09 DIAGNOSIS — M069 Rheumatoid arthritis, unspecified: Secondary | ICD-10-CM

## 2016-03-09 DIAGNOSIS — I639 Cerebral infarction, unspecified: Secondary | ICD-10-CM

## 2016-03-09 LAB — CBC
HEMATOCRIT: 22.6 % — AB (ref 36.0–46.0)
HEMOGLOBIN: 7.5 g/dL — AB (ref 12.0–15.0)
MCH: 31.8 pg (ref 26.0–34.0)
MCHC: 33.2 g/dL (ref 30.0–36.0)
MCV: 95.8 fL (ref 78.0–100.0)
Platelets: 267 10*3/uL (ref 150–400)
RBC: 2.36 MIL/uL — AB (ref 3.87–5.11)
RDW: 16.2 % — ABNORMAL HIGH (ref 11.5–15.5)
WBC: 8.9 10*3/uL (ref 4.0–10.5)

## 2016-03-09 LAB — BASIC METABOLIC PANEL
Anion gap: 7 (ref 5–15)
BUN: 42 mg/dL — AB (ref 6–20)
CHLORIDE: 116 mmol/L — AB (ref 101–111)
CO2: 20 mmol/L — ABNORMAL LOW (ref 22–32)
Calcium: 7.8 mg/dL — ABNORMAL LOW (ref 8.9–10.3)
Creatinine, Ser: 2.32 mg/dL — ABNORMAL HIGH (ref 0.44–1.00)
GFR, EST AFRICAN AMERICAN: 20 mL/min — AB (ref >60)
GFR, EST NON AFRICAN AMERICAN: 18 mL/min — AB (ref >60)
Glucose, Bld: 127 mg/dL — ABNORMAL HIGH (ref 65–99)
POTASSIUM: 5.5 mmol/L — AB (ref 3.5–5.1)
SODIUM: 143 mmol/L (ref 135–145)

## 2016-03-09 LAB — OCCULT BLOOD X 1 CARD TO LAB, STOOL: FECAL OCCULT BLD: POSITIVE — AB

## 2016-03-09 LAB — HEPARIN LEVEL (UNFRACTIONATED): HEPARIN UNFRACTIONATED: 0.5 [IU]/mL (ref 0.30–0.70)

## 2016-03-09 MED ORDER — METOPROLOL TARTRATE 25 MG PO TABS: 25.0000 mg | ORAL_TABLET | Freq: Every day | ORAL | Status: DC

## 2016-03-09 MED ORDER — METOPROLOL TARTRATE 25 MG PO TABS: 25.0000 mg | ORAL_TABLET | Freq: Two times a day (BID) | ORAL | Status: DC

## 2016-03-09 MED ORDER — METOPROLOL TARTRATE 25 MG PO TABS
25.0000 mg | ORAL_TABLET | Freq: Two times a day (BID) | ORAL | Status: DC
  Administered 2016-03-09: 25 mg via ORAL
  Filled 2016-03-09: qty 1

## 2016-03-09 NOTE — Progress Notes (Signed)
ANTICOAGULATION CONSULT NOTE - Follow Up Consult  Pharmacy Consult for Heparin Indication: atrial fibrillation  Allergies  Allergen Reactions  . Sulfa Antibiotics Other (See Comments)    Unknown reaction     Patient Measurements: Height: 5\' 9"  (175.3 cm) Weight: 163 lb 12.8 oz (74.3 kg) IBW/kg (Calculated) : 66.2 Heparin Dosing Weight: actual weight 74 kg  Vital Signs: Temp: 97.5 F (36.4 C) (11/16 0020) Temp Source: Axillary (11/16 0020) BP: 120/78 (11/15 2116) Pulse Rate: 70 (11/15 1400)  Labs:  Recent Labs  03/07/16 1157 03/07/16 1818 03/07/16 2155 03/08/16 1423 03/08/16 2319  HGB 9.4*  --   --  8.2*  --   HCT 29.5*  --   --  25.1*  --   PLT 239  --   --  239  --   APTT  --   --  72*  --   --   LABPROT  --  16.4*  --   --   --   INR  --  1.31  --   --   --   HEPARINUNFRC  --   --   --  0.65 0.47  CREATININE 3.65*  --   --  2.57*  --   TROPONINI  --   --  0.18*  --   --     Estimated Creatinine Clearance: 15.8 mL/min (by C-G formula based on SCr of 2.57 mg/dL (H)).   Medications:  Infusions:  . sodium chloride 75 mL/hr at 03/08/16 1800  . diltiazem (CARDIZEM) infusion 7.5 mg/hr (03/08/16 2156)  . heparin 900 Units/hr (03/08/16 2153)    Assessment: 62 yoF admitted on 11/14 with AMS, UTI, and noted to have Afib and RVR.  PMH includes tachy-brady syndrome, PSVT, elevated troponin, CVA, high risk for falls d/t syncope.  No PTA anticoagulants noted.  Pharmacy is consulted to dose Heparin IV.  11/15:  1400 HL= 0.65 is therapeutic (0400 level not drawn d/t inability to obtain labs, first level obtained ~1400).  CBC: Hgb decreased (9.4 > 8.2) and Plt remain WNL.  2319 HL=0.47, no infusion or bleeding complications reported per RN    Goal of Therapy:  Heparin level 0.3-0.7 units/ml Monitor platelets by anticoagulation protocol: Yes   Plan:   Continue heparin IV infusion at 900 units/hr  Daily heparin level and CBC  Follow up long-term  anticoagulation plans.  2320 R\ 03/09/2016 12:21 AM

## 2016-03-09 NOTE — Evaluation (Signed)
Clinical/Bedside Swallow Evaluation Patient Details  Name: Darlene Daniels MRN: [id number] Date of Birth: [birth year]  Today's Date: 03/09/2016 Time: SLP Start Time (ACUTE ONLY): 1340 SLP Stop Time (ACUTE ONLY): 1420 SLP Time Calculation (min) (ACUTE ONLY): 40 min  Past Medical History:  Past Medical History:  Diagnosis Date  . Abnormality of gait   . Arthritis   . Memory loss   . Other persistent mental disorders due to conditions classified elsewhere   . Palpitations   . Paroxysmal tachycardia, unspecified   . Rheumatoid arthritis (HCC)   . Tachycardia   . Urinary incontinence    Past Surgical History: History reviewed. No pertinent surgical history. HPI:  82 yo female adm to [facility] with AMS, found to have UTI and be in Afib.  PMH + for renal failure, CVA, dementia, RA.  Pt was seen in September 2017 with recommendation for regular/thin diet.  Swallow eval ordered - ? due to pt with findings of new occipital CVA.  CXR showed left mild lung ATX 03/08/16.  Pt has been on puree diet at home per family and feeding herself.  In hospital family reports pt has been fed with good tolerance.     Assessment / Plan / Recommendation Clinical Impression  Pt currently is sleepy - which impairs her swallow function, poor oral transiting and open mouth posture noted with delayed swallow.   Subtle cough x1 delayed after thin via straw- which daughter attributes to pt's cold.  Small single tsps of thin tolerated, suspect decreased bolus size helpful.  Family present and reports pt today is more lethargic than yesterday but denies her having problems swallowing puree/thin.   Pt diet was changed to puree per family within the last month at home due to pt orally holding foods)     Family denies pt having pulmonary infections nor significant weight loss since last seen by this SLP.  Set up oral suction to use if pt orally holding to help maximize airway protection. Educated family to oral suction use,  modifications for diet and po administration.  Further discussed gustatory changes with dementia and compensations.  No follow up indicated as family appears to be managing pt's chronic dysphagia.  SLP to sign off, please reorder if indicated.      Aspiration Risk  Mild aspiration risk    Diet Recommendation Thin liquid;Dysphagia 1 (Puree)   Liquid Administration via: Spoon;Straw Medication Administration: Crushed with puree Supervision: Full supervision/cueing for compensatory strategies Compensations: Slow rate;Small sips/bites Postural Changes: Seated upright at 90 degrees;Remain upright for at least 30 minutes after po intake    Other  Recommendations Oral Care Recommendations: Oral care BID   Follow up Recommendations None      Frequency and Duration            Prognosis        Swallow Study   General Date of Onset: 03/07/16 HPI: 82 yo female adm to [facility] with AMS, found to have UTI and be in Afib.  PMH + for renal failure, CVA, dementia, RA.  Pt was seen in September 2017 with recommendation for regular/thin diet.  Swallow eval ordered - ? due to pt with findings of new occipital CVA.  CXR showed left mild lung ATX 03/08/16.  Pt has been on puree diet at home per family and feeding herself.  In hospital family reports pt has been fed with good tolerance.   Type of Study: Bedside Swallow Evaluation Diet Prior to this Study: Dysphagia 1 (puree);Thin liquids  Temperature Spikes Noted: No Respiratory Status: Nasal cannula History of Recent Intubation: No Behavior/Cognition: Lethargic/Drowsy;Requires cueing Oral Cavity Assessment: Within Functional Limits Oral Care Completed by SLP: No Oral Cavity - Dentition: Adequate natural dentition Self-Feeding Abilities: Needs assist Patient Positioning: Upright in bed Baseline Vocal Quality: Low vocal intensity Volitional Cough: Weak Volitional Swallow: Unable to elicit    Oral/Motor/Sensory Function Overall Oral Motor/Sensory  Function: Generalized oral weakness (bilateral palatal elevation, tongue midline, ? mild facial asymmetry) Facial Symmetry: Abnormal symmetry right;Suspected CN VII (facial) dysfunction Facial Sensation:  (pt did not follow directions for adequate assessment, asked family to evaluate when pt alert, ? decreased sensation )   Ice Chips Ice chips: Not tested   Thin Liquid Thin Liquid: Impaired Presentation: Straw;Spoon Oral Phase Impairments: Reduced labial seal;Reduced lingual movement/coordination Oral Phase Functional Implications: Prolonged oral transit Pharyngeal  Phase Impairments: Cough - Delayed Other Comments: daughter reports delayed cough is due to her "cold"    Nectar Thick Nectar Thick Liquid: Not tested   Honey Thick Honey Thick Liquid: Not tested   Puree Puree: Impaired Presentation: Spoon Oral Phase Impairments: Reduced lingual movement/coordination Oral Phase Functional Implications: Prolonged oral transit Pharyngeal Phase Impairments: Suspected delayed Swallow   Solid   GO   Solid: Not tested        Donavan Burnet, MS [facility] SLP [phone]

## 2016-03-09 NOTE — Progress Notes (Signed)
Patient Name: Darlene Daniels Date of Encounter: 03/09/2016  Primary Cardiologist: Dr. Alicia AmelSmith  Hospital Problem List     Active Problems:   Rheumatoid arthritis [facility](HCC)   Acute on chronic renal failure (HCC)   Dementia   UTI (urinary tract infection)   Acute encephalopathy   Atrial fibrillation (HCC)   CVA (cerebral vascular accident) (HCC)     Subjective   "I feel OK"  Inpatient Medications    Scheduled Meds: . B-complex with vitamin C  1 tablet Oral Daily  . cefTRIAXone (ROCEPHIN)  IV  1 g Intravenous Q24H  . cholecalciferol  5,000 Units Oral Daily  . ferrous sulfate  325 mg Oral QPC breakfast  . folic acid  1 mg Oral Daily  . gabapentin  100 mg Oral QPM  . hydrALAZINE  25 mg Oral BID  . megestrol  400 mg Oral QPM  . memantine  28 mg Oral QPM  . metoprolol tartrate  37.5 mg Oral Daily  . multivitamin with minerals  1 tablet Oral Daily  . oxybutynin  5 mg Oral BID  . polyvinyl alcohol  1 drop Both Eyes BID  . senna  1-2 tablet Oral QHS  . sodium chloride  500 mL Intravenous Once  . sodium chloride flush  10-40 mL Intracatheter Q12H  . vitamin C  1,000 mg Oral Daily   Continuous Infusions: . sodium chloride 75 mL/hr at 03/09/16 0700  . diltiazem (CARDIZEM) infusion 12.5 mg/hr (03/09/16 0715)  . heparin 900 Units/hr (03/09/16 0700)   PRN Meds: acetaminophen **OR** acetaminophen, sodium chloride flush   Vital Signs    Vitals:   03/09/16 0600 03/09/16 0626 03/09/16 0700 03/09/16 0800  BP: (!) 135/103 (!) 133/99    Pulse: (!) 118 (!) 31 88   Resp: 17 17 17    Temp:    98.5 F (36.9 C)  TempSrc:    Oral  SpO2: 97% (!) 89% 99%   Weight:      Height:        Intake/Output Summary (Last 24 hours) at 03/09/16 0847 Last data filed at 03/09/16 0700  Gross per 24 hour  Intake           2184.5 ml  Output                0 ml  Net           2184.5 ml   Filed Weights   03/07/16 2050  Weight: 163 lb 12.8 oz (74.3 kg)    Physical Exam    GEN: Well nourished,   in no acute distress.  HEENT: normocephalic, sclera clear, mucus membranes moist.  Neck: Supple, no JVD Cardiac: RRR, no murmurs, rubs, or gallops. No clubbing, cyanosis, mild ankle edema Lt ankle edema.  Radials/DP/PT 2+ and equal bilaterally.  Respiratory:  Respirations regular and unlabored, clear to auscultation bilaterally, ant. without rales, rhonchi or wheezes. GI: Abd -Soft, nontender, nondistended, BS + x 4. MS: no deformity or atrophy. Skin: warm and dry, brisk capillary refill, no obvious rash Neuro:  Alert to people only,  MAE, follows commands with some hesitation Psych: pleasant affect.   Labs    CBC  Recent Labs  03/08/16 1423 03/09/16 0600  WBC 8.1 8.9  HGB 8.2* 7.5*  HCT 25.1* 22.6*  MCV 97.7 95.8  PLT 239 267   Basic Metabolic Panel  Recent Labs  03/08/16 1423 03/09/16 0642  NA 141 143  K 4.9 5.5*  CL 113* 116*  CO2 21* 20*  GLUCOSE 144* 127*  BUN 47* 42*  CREATININE 2.57* 2.32*  CALCIUM 7.8* 7.8*  MG 2.0  --    Liver Function Tests  Recent Labs  03/07/16 1157  AST 25  ALT 18  ALKPHOS 59  BILITOT 0.4  PROT 6.7  ALBUMIN 2.4*   No results for input(s): LIPASE, AMYLASE in the last 72 hours. Cardiac Enzymes  Recent Labs  03/07/16 2155  TROPONINI 0.18*   BNP Invalid input(s): POCBNP D-Dimer No results for input(s): DDIMER in the last 72 hours. Hemoglobin A1C No results for input(s): HGBA1C in the last 72 hours. Fasting Lipid Panel No results for input(s): CHOL, HDL, LDLCALC, TRIG, CHOLHDL, LDLDIRECT in the last 72 hours. Thyroid Function Tests No results for input(s): TSH, T4TOTAL, T3FREE, THYROIDAB in the last 72 hours.  Invalid input(s): FREET3  Telemetry    A fib - Personally Reviewed  ECG    No new EKGS - Personally Reviewed  Radiology    Dg Chest 2 View  Result Date: 03/07/2016 CLINICAL DATA:  Altered mental status, lethargy for the past 3 days. EXAM: CHEST  2 VIEW COMPARISON:  PA and lateral chest x-ray of  January 04, 2016 FINDINGS: The lungs are adequately inflated. There is no focal infiltrate. There is no pleural effusion. The cardiac silhouette remains enlarged. The pulmonary vascularity is prominent centrally but there is no cephalization of the vascular pattern. There is calcification in the wall of the aortic arch. The mediastinum is normal in width. There is no pleural effusion. The bony thorax exhibits no acute abnormality. There degenerative changes of both shoulders. IMPRESSION: Cardiomegaly without pulmonary edema.  No acute pneumonia. Thoracic aortic atherosclerosis. Electronically Signed   By: David  [place] M.D.   On: 03/07/2016 14:06   Ct Head Wo Contrast  Result Date: 03/07/2016 CLINICAL DATA:  Altered mental status, weakness and lethargy for 3 days. EXAM: CT HEAD WITHOUT CONTRAST TECHNIQUE: Contiguous axial images were obtained from the base of the skull through the vertex without intravenous contrast. COMPARISON:  05/30/2015 FINDINGS: Brain: Stable age related cerebral atrophy, ventriculomegaly and periventricular white matter disease. New area of low attenuation in the left occipital lobe consistent with an infarct. This does not appear acute and could be subacute or chronic. No extra-axial fluid collections. No intracranial hemorrhage. No mass lesions. Vascular: Stable extensive vascular calcifications but no aneurysm or hyperdense vessels. Skull: No skull fracture or bone lesion. Sinuses/Orbits: Extensive paranasal sinus disease with possible sinonasal polyposis. Evidence of prior sinus surgery. The globes are intact. Other: No scalp lesions. IMPRESSION: Left occipital infarct is new since the prior CT from February 2017 but does not appear acute. It is probably subacute or chronic. No intracranial hemorrhage or mass lesion. Chronic sinus disease with possible sinonasal polyposis. Electronically Signed   By: Rudie Meyer M.D.   On: 03/07/2016 14:35   Dg Chest Port 1 View  Result Date:  03/08/2016 CLINICAL DATA:  Central catheter placement EXAM: PORTABLE CHEST 1 VIEW COMPARISON:  March 07, 2016 FINDINGS: Central catheter tip is at the cavoatrial junction. No pneumothorax. There is atelectatic change in the left mid lung. Lungs elsewhere are clear. Stable cardiomegaly with pulmonary vascularity within normal limits. There is atherosclerotic calcification aorta. No adenopathy. No bone lesions. IMPRESSION: Central catheter tip at cavoatrial junction without pneumothorax. Stable cardiomegaly. Left midlung atelectasis. No edema or consolidation. Electronically Signed   By: Bretta Bang III M.D.   On: 03/08/2016 13:12    Cardiac Studies  ECHO 03/08/16  Study Conclusions  - Left ventricle: The cavity size was normal. Systolic function was   mildly to moderately reduced. The estimated ejection fraction was   in the range of 40% to 45%. Diffuse hypokinesis. - Aortic valve: Trileaflet; mild thickening and moderate   calcification of the noncoronary cusp. Transvalvular velocity was   within the normal range. There was no stenosis. There was   moderate regurgitation. Regurgitation pressure half-time: 431 ms. - Mitral valve: Calcified annulus. Transvalvular velocity was   within the normal range. There was no evidence for stenosis.   There was mild regurgitation. - Left atrium: The atrium was severely dilated. - Right ventricle: The cavity size was normal. Wall thickness was   normal. Systolic function was mildly reduced. - Right atrium: The atrium was severely dilated. - Atrial septum: No defect or patent foramen ovale was identified   by color flow Doppler. - Tricuspid valve: There was mild regurgitation. - Pulmonary arteries: Systolic pressure was within the normal   range. PA peak pressure: 59 mm Hg (S).  Impressions:  - Reduced systolic function may be due to atrial fibrillation with   rapid ventricular response. Consider repeating echo when rate has   improved  for a more accurate assessment of LVEF.    Patient Profile     82 year old female with history of hypertension, recurrent syncope, tachycardia bradycardia  Syndrome (PSVT), chronic kidney disease stage III, rheumatoid arthritis, orthostatic hypertension, dementia, urinary incontinence was brought to the ED by her daughter with increased confusion and lethargy for past 1 week, associated with poor appetite.- found to be in A fib with RVR.  Assessment & Plan    1.  A fib with RVR rate controlled off of dilt IV, on IV heparin with CHA2DS2VASc of 6.  She is at high risk for falls with 3 episodes of syncope or near syncope this year. Rate control for her a fib with her severely dilated Lt atrium.    2.  Hx of tachy brady syndrome with hx of PSVT, also with HR 40-50s SR after 2 syncope episodes. --on BB- and IV dilt she may due better with a fib from rate perspective. ? Change to po today but will hold off for now.   3.  Elevated troponin with acute renal failure and a fib RVR.  Though in Sept also elevated and that was with hypotension.   No prior hx of CAD normal EF on echo in Sept.  Echo now with decreased EF  40-45%.  Is this due to rapid HR more of a diffuse hypokinesis.   May be demand ischemia.  No ischemic workup  4.  AMS may be due to UTI and her chronic dementia.  Per IM  5.  UTI per Im  6. Acute renal failure --per IM  Cr at 2.32 today.  7.  HTN now elevated is on metoprolol 37.5 BID  8.  Dementia   9. Anemia with HGB at 7.5 down from 9.4 and HCT at 22.6. May need GI consult for ongoing anticoagulation    10.  + Pulmonary HTN with PA pk pressure 59  11. Moderate AR.   Signed, Nada Boozer, NP  03/09/2016, 8:47 AM  [facility] Pager [phone]  After 5 or weekends [phone]   I have seen and examined the patient along with Nada Boozer, NP.  I have reviewed the chart, notes and new data.  I agree with PA/NP's note.  Key new  complaints: daughter believes she is groggy and less interactive Key examination changes: limited bedside neuro exam is symmetrical, she is relatively easy to wake up, falls asleep quickly Key new findings / data: drop in Hgb noted. Baseline creat 1.6 (GFR 30), but now with creat 2.3.  PLAN:  DC diltiazem and hydralazine. Metoprolol alone might suffice for BP and HR control. If she remains in permanent AFib, that would actually make rhythm management more straightforward. She would not need a pacemaker. MRI pending to clarify whether she had a stroke. If creat drops to 1.6 consider low dose Eliquis (2.5 mg BID), otherwise warfarin is the only reasonable option. Until renal function stabilizes and we clarify whether there is any acute bleeding, keep on heparin IV.  Thurmon Fair, MD, [facility] [phone] 03/09/2016, 2:08 PM

## 2016-03-09 NOTE — Progress Notes (Signed)
Carotid has been placed on hold per physician order. Patient had a Carotid duplex 01/14/2016 which was within normal limits at that time. If a new study is needed please reorder. Thank-you.

## 2016-03-09 NOTE — Progress Notes (Signed)
ANTICOAGULATION CONSULT NOTE - Follow Up Consult  Pharmacy Consult for Heparin Indication: atrial fibrillation  Allergies  Allergen Reactions  . Sulfa Antibiotics Other (See Comments)    Unknown reaction     Patient Measurements: Height: 5\' 9"  (175.3 cm) Weight: 163 lb 12.8 oz (74.3 kg) IBW/kg (Calculated) : 66.2 Heparin Dosing Weight: actual weight 74 kg  Vital Signs: Temp: 98.5 F (36.9 C) (11/16 0423) Temp Source: Axillary (11/16 0423) BP: 133/99 (11/16 0626) Pulse Rate: 88 (11/16 0700)  Labs:  Recent Labs  03/07/16 1157 03/07/16 1818 03/07/16 2155 03/08/16 1423 03/08/16 2319 03/09/16 0600 03/09/16 0642  HGB 9.4*  --   --  8.2*  --  7.5*  --   HCT 29.5*  --   --  25.1*  --  22.6*  --   PLT 239  --   --  239  --  267  --   APTT  --   --  72*  --   --   --   --   LABPROT  --  16.4*  --   --   --   --   --   INR  --  1.31  --   --   --   --   --   HEPARINUNFRC  --   --   --  0.65 0.47 0.50  --   CREATININE 3.65*  --   --  2.57*  --   --  2.32*  TROPONINI  --   --  0.18*  --   --   --   --     Estimated Creatinine Clearance: 17.5 mL/min (by C-G formula based on SCr of 2.32 mg/dL (H)).   Medications:  Infusions:  . sodium chloride 75 mL/hr at 03/09/16 0700  . diltiazem (CARDIZEM) infusion 12.5 mg/hr (03/09/16 0715)  . heparin 900 Units/hr (03/09/16 0700)    Assessment: 55 yoF admitted on 11/14 with AMS, UTI, and noted to have Afib and RVR.  PMH includes tachy-brady syndrome, PSVT, elevated troponin, CVA, high risk for falls d/t syncope.  No PTA anticoagulants noted.  Pharmacy is consulted to dose Heparin IV.  Today, 03/09/2016:  Heparin level 0.5 is therapeutic  CBC: Hgb decreased (9.4 > 8.2 > 7.5) and Plt remain WNL.  No bleeding or complications reported.  MD to re-evaluate today.   Goal of Therapy:  Heparin level 0.3-0.7 units/ml Monitor platelets by anticoagulation protocol: Yes   Plan:   Continue heparin IV infusion at 9 units/hr  Daily  heparin level and CBC  Follow up long-term anticoagulation plans.   03/11/2016 PharmD, BCPS Pager [phone] 03/09/2016 7:58 AM

## 2016-03-09 NOTE — Progress Notes (Signed)
Pt's HR sustaining 115-135. Called cardiology to notify MD. No new orders at this time. Will continue to monitor pt closely.

## 2016-03-09 NOTE — Progress Notes (Signed)
PROGRESS NOTE                                                                                                                                                                                                             Patient Demographics:    Darlene RicherDorothy Daniels, is a 82 y.o. female, DOB - [birth year], ZOX:[id number]  Admit date - 03/07/2016   Admitting Physician Starleen Armsawood S Elgergawy, MD  Outpatient Primary MD for the patient is Alva GarnetSHELTON,KIMBERLY R., MD  LOS - 2  Outpatient Specialists: none  Chief Complaint  Patient presents with  . Altered Mental Status       Brief Narrative  82 year old female with history of hypertension, recurrent syncope, tachycardia bradycardia syndrome, chronic kidney disease stage III, rheumatoid arthritis, orthostatic hypertension, dementia, urinary incontinence was brought to the ED by her daughter with increased confusion and lethargy for past 1 week, associated with poor appetite. Patient was diagnosed with a UTI 3 times by her PCP in the past 2 months and treated with Cipro, nitrofurantoin and Keflex. No fevers, chills, nausea, vomiting, complaining of chest pain, shortness of breath, urinary symptoms or diarrhea. In the ED she was found to be in rapid A. fib. UA positive for UTI and worsened renal function from baseline. Chest x-ray unremarkable. Head CT showing subacute/chronic left occipital infarct (new from CT since February 2017). Patient placed on Cardizem drip and heparin drip and admitted to hospitalist service in stepdown unit.   Subjective:   Patient still confused although per caregiver is more oriented.   Assessment  & Plan :   Principal problem Atrial fibrillation with RVR (HCC) -New onset. Started on Cardizem and heparin drip. Cardiology consulted. -CHA2DS2VASc of 6. Further recommendations pending. -2-D echo repeated with EF of 40-45% and diffuse hypokinesis (new compared to last  echo 2 months back). Likely due to rapid A. Fib. -planning to be converted to oral regimen  UTI -Continue empiric Rocephin. Follow cultures.  Acute metabolic encephalopathy -Worsened mentation on admission. Patient with underlying dementia, and with encephalopathy due to  UTI and subacute stroke.  -monitor for now -continue constant re-orientation   Acute on chronic kidney injury stage III -Suspect secondary to dehydration and UTI.  -Gentle IV fluids given.  -will follow Cr trend.  Hyperkalemia -slightly elevated -K 5.5 today -will monitor and repeat BMET in am  New Systolic CHF -Noted for low EF of 40-45%, Likely due to rapid A. fib.  -has remained Euvolemic on exam. -will follow daily weights and strict intake and output   Elevated troponin -Chronic and in the setting of A. Fib with RVR.  -No chest pain symptoms or other acute EKG changes. -cardiology on board   ? CVA (cerebral vascular accident) (HCC) -Subacute left occipital infarct seen on head CT.  -MRI brain pending. -patient with plans for anticoagulation for secondary prevention -continue risk factors modifications   Dementia -Continue Namenda.  Rheumatoid arthritis -was On Celebrex. Will continue holding given poor renal function and ongoing use of anticoagulation..  Acute on chronic Iron deficiency anemia -Continue supplemental ferrous sulfate. -will follow Hgb trend -mild dilutional effect present -will check FOBT  Essential hypertension  -continue hydralazine.  Poor IV access and limitation to blood draw. -Discussed options for IV access with daughter and she prefers having a PICC line or midline for blood draws.  -PICC line in place now   Code Status : Full code (discussed with daughter at length)  Family Communication  : Spoke with daughter at bedside  Disposition Plan  : Continue step down monitoring, for now. Plan is to hopefully transition to PO cardizem; complete MRI and have PT/OT  evaluation. If stable can be transferred to telemetry in am.  Barriers For Discharge : Active symptoms  Consults  :  Cardiology  Procedures  : 2-D echo:  - Left ventricle: The cavity size was normal. Systolic function was   mildly to moderately reduced. The estimated ejection fraction was   in the range of 40% to 45%. Diffuse hypokinesis. - Aortic valve: Trileaflet; mild thickening and moderate   calcification of the noncoronary cusp. Transvalvular velocity was   within the normal range. There was no stenosis. There was   moderate regurgitation. Regurgitation pressure half-time: 431 ms. - Mitral valve: Calcified annulus. Transvalvular velocity was   within the normal range. There was no evidence for stenosis.   There was mild regurgitation. - Left atrium: The atrium was severely dilated. - Right ventricle: The cavity size was normal. Wall thickness was   normal. Systolic function was mildly reduced. - Right atrium: The atrium was severely dilated. - Atrial septum: No defect or patent foramen ovale was identified   by color flow Doppler. - Tricuspid valve: There was mild regurgitation. - Pulmonary arteries: Systolic pressure was within the normal   range. PA peak pressure: 59 mm Hg (S).  DVT Prophylaxis  : IV heparin  Lab Results  Component Value Date   PLT 267 03/09/2016    Antibiotics  :    Anti-infectives    Start     Dose/Rate Route Frequency Ordered Stop   03/08/16 1700  cefTRIAXone (ROCEPHIN) 1 g in dextrose 5 % 50 mL IVPB     1 g 100 mL/hr over 30 Minutes Intravenous Every 24 hours 03/07/16 2052     03/07/16 1645  cefTRIAXone (ROCEPHIN) 1 g in dextrose 5 % 50 mL IVPB     1 g 100 mL/hr over 30 Minutes Intravenous  Once 03/07/16 1638 03/07/16 1741        Objective:   Vitals:   03/09/16 1000 03/09/16 1100 03/09/16 1111 03/09/16 1200  BP: 109/76 (!) 72/49 94/63 94/74   Pulse: (!) 28 (!) 48  60  Resp: 19 17 19 15   Temp:      TempSrc:      SpO2: 92% 97%  95%  Weight:      Height:        Wt Readings from Last 3 Encounters:  03/07/16 74.3 kg (163 lb 12.8 oz)  01/04/16 77.6 kg (171 lb)  08/12/15 79.4 kg (175 lb)     Intake/Output Summary (Last 24 hours) at 03/09/16 1224 Last data filed at 03/09/16 1200  Gross per 24 hour  Intake           2268.5 ml  Output                0 ml  Net           2268.5 ml     Physical Exam  Gen: not in distress, oriented X1, able to follow commands; slow to response  HEENT:  moist mucosa, supple neck, no JVD Chest: clear b/l, no use of accessory muscles CVS: S1 and S2 irregular, No murmurs rub or gallop GI: soft, NT, ND, BS+ Musculoskeletal: warm, no edema CNS: Alert and awake, oriented x1, moving four limbs and able to follow simple commands     Data Review:    CBC  Recent Labs Lab 03/07/16 1157 03/08/16 1423 03/09/16 0600  WBC 7.9 8.1 8.9  HGB 9.4* 8.2* 7.5*  HCT 29.5* 25.1* 22.6*  PLT 239 239 267  MCV 96.7 97.7 95.8  MCH 30.8 31.9 31.8  MCHC 31.9 32.7 33.2  RDW 15.9* 16.1* 16.2*    Chemistries   Recent Labs Lab 03/07/16 1157 03/08/16 1423 03/09/16 0642  NA 139 141 143  K 5.8* 4.9 5.5*  CL 110 113* 116*  CO2 20* 21* 20*  GLUCOSE 198* 144* 127*  BUN 55* 47* 42*  CREATININE 3.65* 2.57* 2.32*  CALCIUM 8.8* 7.8* 7.8*  MG  --  2.0  --   AST 25  --   --   ALT 18  --   --   ALKPHOS 59  --   --   BILITOT 0.4  --   --     Lab Results  Component Value Date   HGBA1C 6.3 (H) 05/30/2015   Coagulation profile  Recent Labs Lab 03/07/16 1818  INR 1.31   Cardiac Enzymes  Recent Labs Lab 03/07/16 2155  TROPONINI 0.18*   ------------------------------------------------------------------------------------------------------------------    Component Value Date/Time   BNP 719.0 (H) 05/31/2015 1304    Inpatient Medications  Scheduled Meds: . B-complex with vitamin C  1 tablet Oral Daily  . cefTRIAXone (ROCEPHIN)  IV  1 g Intravenous Q24H  . cholecalciferol  5,000  Units Oral Daily  . ferrous sulfate  325 mg Oral QPC breakfast  . folic acid  1 mg Oral Daily  . gabapentin  100 mg Oral QPM  . hydrALAZINE  25 mg Oral BID  . megestrol  400 mg Oral QPM  . memantine  28 mg Oral QPM  . metoprolol tartrate  37.5 mg Oral Daily  . multivitamin with minerals  1 tablet Oral Daily  . oxybutynin  5 mg Oral BID  . polyvinyl alcohol  1 drop Both Eyes BID  . senna  1-2 tablet Oral QHS  . sodium chloride  500 mL Intravenous Once  . sodium chloride flush  10-40 mL Intracatheter Q12H  . vitamin C  1,000 mg Oral Daily   Continuous Infusions: . sodium chloride 75 mL/hr at 03/09/16 1200  . diltiazem (CARDIZEM) infusion 5 mg/hr (03/09/16 1200)  . heparin 900 Units/hr (03/09/16 1200)   Micro Results Recent Results (from the past 240 hour(s))  Blood culture (routine x 2)     Status: None (Preliminary result)   Collection Time: 03/07/16  3:36 PM  Result Value Ref Range Status   Specimen Description BLOOD RIGHT ANTECUBITAL  Final   Special Requests BOTTLES DRAWN AEROBIC AND ANAEROBIC 5 CC  Final   Culture   Final    NO GROWTH < 24 HOURS Performed at [facility]    Report Status PENDING  Incomplete  Blood culture (routine x 2)     Status: None (Preliminary result)   Collection Time: 03/07/16  3:36 PM  Result Value Ref Range Status   Specimen Description BLOOD LEFT ANTECUBITAL  Final   Special Requests BOTTLES DRAWN AEROBIC ONLY 6 CC  Final   Culture   Final    NO GROWTH < 24 HOURS Performed at [facility]    Report Status PENDING  Incomplete  Urine culture     Status: None   Collection Time: 03/07/16  3:51 PM  Result Value Ref Range Status   Specimen Description URINE, CATHETERIZED  Final   Special Requests NONE  Final   Culture NO GROWTH Performed at [facility]   Final   Report Status 03/08/2016 FINAL  Final  MRSA PCR Screening     Status: None   Collection Time: 03/07/16  8:58 PM  Result Value Ref Range Status   MRSA by PCR  NEGATIVE NEGATIVE Final    Comment:        The GeneXpert MRSA Assay (FDA approved for NASAL specimens only), is one component of a comprehensive MRSA colonization surveillance program. It is not intended to diagnose MRSA infection nor to guide or monitor treatment for MRSA infections.     Radiology Reports Dg Chest 2 View  Result Date: 03/07/2016 CLINICAL DATA:  Altered mental status, lethargy for the past 3 days. EXAM: CHEST  2 VIEW COMPARISON:  PA and lateral chest x-ray of January 04, 2016 FINDINGS: The lungs are adequately inflated. There is no focal infiltrate. There is no pleural effusion. The cardiac silhouette remains enlarged. The pulmonary vascularity is prominent centrally but there is no cephalization of the vascular pattern. There is calcification in the wall of the aortic arch. The mediastinum is normal in width. There is no pleural effusion. The bony thorax exhibits no acute abnormality. There degenerative changes of both shoulders. IMPRESSION: Cardiomegaly without pulmonary edema.  No acute pneumonia. Thoracic aortic atherosclerosis. Electronically Signed   By: David  [place] M.D.   On: 03/07/2016 14:06   Ct Head Wo Contrast  Result Date: 03/07/2016 CLINICAL DATA:  Altered mental status, weakness and lethargy for 3 days. EXAM: CT HEAD WITHOUT CONTRAST TECHNIQUE: Contiguous axial images were obtained from the base of the skull through the vertex without intravenous contrast. COMPARISON:  05/30/2015 FINDINGS: Brain: Stable age related cerebral atrophy, ventriculomegaly and periventricular white matter disease. New area of low attenuation in the left occipital lobe consistent with an infarct. This does not appear acute and could be subacute or chronic. No extra-axial fluid collections. No intracranial hemorrhage. No mass lesions. Vascular: Stable extensive vascular calcifications but no aneurysm or hyperdense vessels. Skull: No skull fracture or bone lesion. Sinuses/Orbits:  Extensive paranasal sinus disease with possible sinonasal polyposis. Evidence of prior sinus surgery. The globes are intact. Other: No scalp lesions. IMPRESSION: Left occipital infarct is new since the prior CT from February 2017 but does not appear acute. It is probably subacute or chronic. No intracranial hemorrhage or mass lesion. Chronic sinus disease  with possible sinonasal polyposis. Electronically Signed   By: Rudie Meyer M.D.   On: 03/07/2016 14:35   Dg Chest Port 1 View  Result Date: 03/08/2016 CLINICAL DATA:  Central catheter placement EXAM: PORTABLE CHEST 1 VIEW COMPARISON:  March 07, 2016 FINDINGS: Central catheter tip is at the cavoatrial junction. No pneumothorax. There is atelectatic change in the left mid lung. Lungs elsewhere are clear. Stable cardiomegaly with pulmonary vascularity within normal limits. There is atherosclerotic calcification aorta. No adenopathy. No bone lesions. IMPRESSION: Central catheter tip at cavoatrial junction without pneumothorax. Stable cardiomegaly. Left midlung atelectasis. No edema or consolidation. Electronically Signed   By: Bretta Bang III M.D.   On: 03/08/2016 13:12    Time Spent in minutes  35   Vassie Loll M.D on 03/09/2016 at 12:24 PM  Between 7am to 7pm - Pager - [phone]  After 7pm go to [email or web address] - password [facility]  [facility] -  Office  [phone]

## 2016-03-09 NOTE — Progress Notes (Signed)
Order received to notify cardiology if pt HR maintaining over 130. Will continue to monitor pt closely.

## 2016-03-10 DIAGNOSIS — Z8673 Personal history of transient ischemic attack (TIA), and cerebral infarction without residual deficits: Secondary | ICD-10-CM

## 2016-03-10 DIAGNOSIS — D62 Acute posthemorrhagic anemia: Secondary | ICD-10-CM

## 2016-03-10 DIAGNOSIS — I519 Heart disease, unspecified: Secondary | ICD-10-CM

## 2016-03-10 DIAGNOSIS — R195 Other fecal abnormalities: Secondary | ICD-10-CM

## 2016-03-10 LAB — IRON AND TIBC
Iron: 173 ug/dL — ABNORMAL HIGH (ref 28–170)
Saturation Ratios: 65 % — ABNORMAL HIGH (ref 10.4–31.8)
TIBC: 266 ug/dL (ref 250–450)
UIBC: 93 ug/dL

## 2016-03-10 LAB — BASIC METABOLIC PANEL
Anion gap: 5 (ref 5–15)
BUN: 42 mg/dL — AB (ref 6–20)
CALCIUM: 8.1 mg/dL — AB (ref 8.9–10.3)
CHLORIDE: 117 mmol/L — AB (ref 101–111)
CO2: 20 mmol/L — AB (ref 22–32)
CREATININE: 2.12 mg/dL — AB (ref 0.44–1.00)
GFR calc Af Amer: 23 mL/min — ABNORMAL LOW (ref >60)
GFR calc non Af Amer: 20 mL/min — ABNORMAL LOW (ref >60)
Glucose, Bld: 143 mg/dL — ABNORMAL HIGH (ref 65–99)
Potassium: 4.9 mmol/L (ref 3.5–5.1)
SODIUM: 142 mmol/L (ref 135–145)

## 2016-03-10 LAB — RETICULOCYTES
RBC.: 3.12 MIL/uL — AB (ref 3.87–5.11)
RETIC COUNT ABSOLUTE: 96.7 10*3/uL (ref 19.0–186.0)
Retic Ct Pct: 3.1 % (ref 0.4–3.1)

## 2016-03-10 LAB — CBC
HCT: 20.5 % — ABNORMAL LOW (ref 36.0–46.0)
HEMOGLOBIN: 6.8 g/dL — AB (ref 12.0–15.0)
MCH: 31.9 pg (ref 26.0–34.0)
MCHC: 33.2 g/dL (ref 30.0–36.0)
MCV: 96.2 fL (ref 78.0–100.0)
PLATELETS: 227 10*3/uL (ref 150–400)
RBC: 2.13 MIL/uL — AB (ref 3.87–5.11)
RDW: 16.3 % — AB (ref 11.5–15.5)
WBC: 8.9 10*3/uL (ref 4.0–10.5)

## 2016-03-10 LAB — FERRITIN: FERRITIN: 98 ng/mL (ref 11–307)

## 2016-03-10 LAB — FOLATE: FOLATE: 53.4 ng/mL (ref >5.9)

## 2016-03-10 LAB — ABO/RH: ABO/RH(D): O POS

## 2016-03-10 LAB — PREPARE RBC (CROSSMATCH)

## 2016-03-10 LAB — HEPARIN LEVEL (UNFRACTIONATED): Heparin Unfractionated: 1.32 IU/mL — ABNORMAL HIGH (ref 0.30–0.70)

## 2016-03-10 LAB — HEMOGLOBIN AND HEMATOCRIT, BLOOD
HEMATOCRIT: 28.3 % — AB (ref 36.0–46.0)
Hemoglobin: 9.5 g/dL — ABNORMAL LOW (ref 12.0–15.0)

## 2016-03-10 LAB — VITAMIN B12: VITAMIN B 12: 2007 pg/mL — AB (ref 180–914)

## 2016-03-10 MED ORDER — PANTOPRAZOLE SODIUM 40 MG PO TBEC
40.0000 mg | DELAYED_RELEASE_TABLET | Freq: Two times a day (BID) | ORAL | Status: DC
  Administered 2016-03-10: 40 mg via ORAL
  Filled 2016-03-10: qty 1

## 2016-03-10 MED ORDER — SODIUM CHLORIDE 0.9 % IV SOLN
Freq: Once | INTRAVENOUS | Status: AC
  Administered 2016-03-10: 06:00:00 via INTRAVENOUS

## 2016-03-10 MED ORDER — METOPROLOL TARTRATE 12.5 MG HALF TABLET: 37.5000 mg | ORAL_TABLET | Freq: Two times a day (BID) | ORAL | Status: DC

## 2016-03-10 MED ORDER — PANTOPRAZOLE SODIUM 40 MG IV SOLR
40.0000 mg | INTRAVENOUS | Status: DC
  Administered 2016-03-10 – 2016-03-11 (×2): 40 mg via INTRAVENOUS
  Filled 2016-03-10 (×2): qty 40

## 2016-03-10 MED ORDER — GUAIFENESIN ER 600 MG PO TB12
1200.0000 mg | ORAL_TABLET | Freq: Two times a day (BID) | ORAL | Status: DC
  Administered 2016-03-10 – 2016-03-11 (×3): 1200 mg via ORAL
  Filled 2016-03-10 (×4): qty 2

## 2016-03-10 MED ORDER — METOPROLOL TARTRATE 25 MG PO TABS
37.5000 mg | ORAL_TABLET | Freq: Two times a day (BID) | ORAL | Status: DC
  Administered 2016-03-10 (×2): 37.5 mg via ORAL
  Filled 2016-03-10 (×2): qty 1

## 2016-03-10 MED ORDER — SODIUM CHLORIDE 0.9 % IV SOLN: Freq: Once | INTRAVENOUS | Status: AC

## 2016-03-10 NOTE — Progress Notes (Signed)
PT demonstrated hands on understanding of Flutter device. Caregivers at bedside and demonstrated verbal understanding and willing to assist PT.

## 2016-03-10 NOTE — Progress Notes (Signed)
Received order to transfuse RBC. No type and screen on file. Collected type and screen and sent to lab. Called pt's POA, daughter Velna Hatchet), for consent. Will begin transfusion upon receiving type and screen.

## 2016-03-10 NOTE — Progress Notes (Signed)
PT Cancellation Note  Patient Details Name: Laikyn Gewirtz MRN: [id number] DOB: 06/13/1924   Cancelled Treatment:    Reason Eval/Treat Not Completed: Medical issues which prohibited therapy (increased HR , to get RBC's)   Rada Hay 03/10/2016, 7:19 AM Blanchard Kelch PT [phone]

## 2016-03-10 NOTE — Progress Notes (Signed)
ANTICOAGULATION CONSULT NOTE - Follow Up Consult  Pharmacy Consult for Heparin Indication: atrial fibrillation  Allergies  Allergen Reactions  . Sulfa Antibiotics Other (See Comments)    Unknown reaction     Patient Measurements: Height: 5\' 9"  (175.3 cm) Weight: 163 lb 12.8 oz (74.3 kg) IBW/kg (Calculated) : 66.2 Heparin Dosing Weight: actual weight 74 kg  Vital Signs: Temp: 98.6 F (37 C) (11/17 0600) Temp Source: Oral (11/17 0600) BP: 161/105 (11/17 0500) Pulse Rate: 121 (11/17 0600)  Labs:  Recent Labs  03/07/16 1818 03/07/16 2155  03/08/16 1423 03/08/16 2319 03/09/16 0600 03/09/16 0642 03/10/16 0347  HGB  --   --   --  8.2*  --  7.5*  --  6.8*  HCT  --   --   --  25.1*  --  22.6*  --  20.5*  PLT  --   --   --  239  --  267  --  227  APTT  --  72*  --   --   --   --   --   --   LABPROT 16.4*  --   --   --   --   --   --   --   INR 1.31  --   --   --   --   --   --   --   HEPARINUNFRC  --   --   < > 0.65 0.47 0.50  --  1.32*  CREATININE  --   --   --  2.57*  --   --  2.32* 2.12*  TROPONINI  --  0.18*  --   --   --   --   --   --   < > = values in this interval not displayed.  Estimated Creatinine Clearance: 19.2 mL/min (by C-G formula based on SCr of 2.12 mg/dL (H)).   Medications:  Infusions:  . sodium chloride 50 mL/hr at 03/10/16 0200  . heparin 900 Units/hr (03/10/16 0359)    Assessment: 89 yoF admitted on 11/14 with AMS, UTI, and noted to have Afib and RVR.  PMH includes tachy-brady syndrome, PSVT, elevated troponin, CVA, high risk for falls d/t syncope.  No PTA anticoagulants noted.  Pharmacy is consulted to dose Heparin IV.  11/16  Heparin level 0.5 is therapeutic  CBC: Hgb decreased (9.4 > 8.2 > 7.5) and Plt remain WNL.  No bleeding or complications reported.  MD to re-evaluate today.  Today, 11/17  0347 HL=1.32> running in left arm, drawn from right PICC, no infusion problems, but fecal occult + and Hgb=6.8   Goal of Therapy:  Heparin  level 0.3-0.7 units/ml Monitor platelets by anticoagulation protocol: Yes   Plan:   Hold heparin x1 hour 12/17)  Restart heparin drip at 600 units/hr (6 ml/hr)  Daily heparin level and CBC  Follow up long-term anticoagulation plans.  ([id number] 03/10/2016 6:27 AM

## 2016-03-10 NOTE — Progress Notes (Signed)
Patient Name: Darlene Daniels Date of Encounter: 03/10/2016  Primary Cardiologist: Dr. Alicia Amel Problem List     Active Problems:   Rheumatoid arthritis [facility])   Acute on chronic renal failure (HCC)   Dementia   UTI (urinary tract infection)   Acute encephalopathy   Atrial fibrillation (HCC)   CVA (cerebral vascular accident) (HCC)     Subjective   No complaints, denies chset pain or SOB   Inpatient Medications    Scheduled Meds: . sodium chloride   Intravenous Once  . B-complex with vitamin C  1 tablet Oral Daily  . cefTRIAXone (ROCEPHIN)  IV  1 g Intravenous Q24H  . cholecalciferol  5,000 Units Oral Daily  . ferrous sulfate  325 mg Oral QPC breakfast  . folic acid  1 mg Oral Daily  . gabapentin  100 mg Oral QPM  . megestrol  400 mg Oral QPM  . memantine  28 mg Oral QPM  . metoprolol tartrate  25 mg Oral BID  . multivitamin with minerals  1 tablet Oral Daily  . oxybutynin  5 mg Oral BID  . polyvinyl alcohol  1 drop Both Eyes BID  . senna  1-2 tablet Oral QHS  . sodium chloride  500 mL Intravenous Once  . sodium chloride flush  10-40 mL Intracatheter Q12H  . vitamin C  1,000 mg Oral Daily   Continuous Infusions: . sodium chloride 50 mL/hr at 03/10/16 0700   PRN Meds: acetaminophen **OR** acetaminophen, sodium chloride flush   Vital Signs    Vitals:   03/10/16 0500 03/10/16 0600 03/10/16 0638 03/10/16 0700  BP: (!) 161/105 (!) 151/116 (!) 163/109 (!) 171/116  Pulse: (!) 131 (!) 121 (!) 135   Resp: 16 16 16  (!) 22  Temp:  98.6 F (37 C) 99.4 F (37.4 C)   TempSrc:  Oral Oral   SpO2: 99% 99% 98%   Weight:      Height:        Intake/Output Summary (Last 24 hours) at 03/10/16 0812 Last data filed at 03/10/16 0700  Gross per 24 hour  Intake          1643.38 ml  Output                0 ml  Net          1643.38 ml   Filed Weights   03/07/16 2050  Weight: 163 lb 12.8 oz (74.3 kg)    Physical Exam   GEN: Well nourished,  in no acute  distress.  HEENT: normocephalic, sclera clear, mucus membranes moist.  Neck: Supple, no JVD Cardiac: irreg irreg, no murmurs, rubs, or gallops. No clubbing, cyanosis, edema.  Radials 2+ and equal bilaterally.  Respiratory:  Respirations regular and unlabored, clear to auscultation bilaterally- ant.  without rales, rhonchi or wheezes. GI: Abd -Soft, nontender, nondistended, BS + x 4. MS: no deformity or atrophy. Skin: warm and dry, brisk capillary refill, no obvious rash Neuro:  Alert  MAE, follows commands Psych: answers questions at times, other times does not know answeres, pleasant affect.   Labs    CBC  Recent Labs  03/09/16 0600 03/10/16 0347  WBC 8.9 8.9  HGB 7.5* 6.8*  HCT 22.6* 20.5*  MCV 95.8 96.2  PLT 267 227   Basic Metabolic Panel  Recent Labs  03/08/16 1423 03/09/16 0642 03/10/16 0347  NA 141 143 142  K 4.9 5.5* 4.9  CL 113* 116* 117*  CO2 21*  20* 20*  GLUCOSE 144* 127* 143*  BUN 47* 42* 42*  CREATININE 2.57* 2.32* 2.12*  CALCIUM 7.8* 7.8* 8.1*  MG 2.0  --   --    Liver Function Tests  Recent Labs  03/07/16 1157  AST 25  ALT 18  ALKPHOS 59  BILITOT 0.4  PROT 6.7  ALBUMIN 2.4*   No results for input(s): LIPASE, AMYLASE in the last 72 hours. Cardiac Enzymes  Recent Labs  03/07/16 2155  TROPONINI 0.18*     Telemetry    A fib with RVR rates 135-120 with either aberrancy vs. PVCs - Personally Reviewed  ECG    None since 03/08/16 continues in A fib- Personally Reviewed  Radiology    Mr Brain Wo Contrast  Result Date: 03/09/2016 CLINICAL DATA:  82 y/o F; 1 week of increasing confusion and lethargy. EXAM: MRI HEAD WITHOUT CONTRAST TECHNIQUE: Multiplanar, multiecho pulse sequences of the brain and surrounding structures were obtained without intravenous contrast. COMPARISON:  05/03/2012 MRI of the head.  03/07/2016 CT of the head. FINDINGS: Brain: No diffusion signal abnormality. Patchy T2 FLAIR hyperintense white matter signal  abnormality minute predominantly perivascular distribution compatible with advanced chronic microvascular ischemic changes. Chronic left occipital infarct. Multiple chronic small lacunar infarcts within the basal ganglia bilaterally, right pons, and bilateral cerebellar hemispheres. Extensive diffuse brain parenchymal volume loss. Punctate focus of susceptibility hypointensity and right posterior temporal lobe is compatible hemosiderin deposition from old microhemorrhage. Vascular: Normal flow voids. Skull and upper cervical spine: Normal marrow signal. Sinuses/Orbits: Extensive paranasal sinus mucosal thickening and opacification of right maxillary, sphenoid, ethmoid, and frontal sinuses with fluid levels. Partial opacification of mastoid tips bilaterally. Other: None. IMPRESSION: 1. No acute intracranial abnormality identified. 2. Advanced chronic microvascular ischemic changes, parenchymal volume loss, multiple chronic lacunar infarcts in the cerebellum, pons, and basal ganglia, as well as left occipital chronic infarct. Findings are progressed from 2014. 3. Severe paranasal sinus disease with fluid levels which may represent acute on chronic sinusitis in the appropriate clinical setting. Electronically Signed   By: Mitzi Hansen M.D.   On: 03/09/2016 19:08   Dg Chest Port 1 View  Result Date: 03/08/2016 CLINICAL DATA:  Central catheter placement EXAM: PORTABLE CHEST 1 VIEW COMPARISON:  March 07, 2016 FINDINGS: Central catheter tip is at the cavoatrial junction. No pneumothorax. There is atelectatic change in the left mid lung. Lungs elsewhere are clear. Stable cardiomegaly with pulmonary vascularity within normal limits. There is atherosclerotic calcification aorta. No adenopathy. No bone lesions. IMPRESSION: Central catheter tip at cavoatrial junction without pneumothorax. Stable cardiomegaly. Left midlung atelectasis. No edema or consolidation. Electronically Signed   By: Bretta Bang  III M.D.   On: 03/08/2016 13:12    Cardiac Studies   ECHO 03/08/16  Study Conclusions  - Left ventricle: The cavity size was normal. Systolic function was mildly to moderately reduced. The estimated ejection fraction was in the range of 40% to 45%. Diffuse hypokinesis. - Aortic valve: Trileaflet; mild thickening and moderate calcification of the noncoronary cusp. Transvalvular velocity was within the normal range. There was no stenosis. There was moderate regurgitation. Regurgitation pressure half-time: 431 ms. - Mitral valve: Calcified annulus. Transvalvular velocity was within the normal range. There was no evidence for stenosis. There was mild regurgitation. - Left atrium: The atrium was severely dilated. - Right ventricle: The cavity size was normal. Wall thickness was normal. Systolic function was mildly reduced. - Right atrium: The atrium was severely dilated. - Atrial septum: No defect or  patent foramen ovale was identified by color flow Doppler. - Tricuspid valve: There was mild regurgitation. - Pulmonary arteries: Systolic pressure was within the normal range. PA peak pressure: 59 mm Hg (S).  Impressions:  - Reduced systolic function may be due to atrial fibrillation with rapid ventricular response. Consider repeating echo when rate has improved for a more accurate assessment of LVEF.    Patient Profile     82 year old female with history of hypertension, recurrent syncope, tachycardia bradycardia  Syndrome (PSVT), chronic kidney disease stage III, rheumatoid arthritis, orthostatic hypertension, dementia, urinary incontinence was brought to the ED by her daughter with increased confusion and lethargy for past 1 week, associated with poor appetite.- found to be in A fib with RVR.   Assessment & Plan    1. A fib with RVR rate was not controlled off of dilt IV HR freq up to 135- on 25 of BB, will increase to 50 BID - had been decreased  due to SB.  Also had been on IV heparin but  stopped with acute anemia HGB 6.8  with CHA2DS2VASc of 6. HAS BLED score also elevated at 4.  She is at high risk for falls with 3 episodes of syncope or near syncope this year but these were more sitting on floor falls.  Daughter [facility] has wanted everything done. But today with discussion the bleeding is very concerning.  Her goal is back to a balance. At this time she is thinking no anticoagulation. -Rate control for her a fib with her severely dilated Lt atrium.  increase BB today, she may need 50 mg BID but will go slow.   2. Hx of tachy brady syndrome with hx of PSVT, also with HR 40-50s SR after 2 syncope episodes. -- was on BB- and IV dilt but changed to po BB alone yesterday.  she may due better with a fib from rate perspective. Will increase BB  3. Elevated troponin with acute renal failure and a fib RVR. Though in Sept also elevated and that was with hypotension. No prior hx of CAD normal EF on echo in Sept. Echo now with decreased EF  40-45%.  Is this due to rapid HR more of a diffuse hypokinesis.  May be demand ischemia.  No ischemic workup  4. AMS may be due to UTI and her chronic dementia. Per IM  5. UTI per Im  6. Acute renal failure --per IM  Cr at 2.12 today.  7. HTN now elevated is on metoprolol 25 BID will increase to 37.5 BID with first dose today -BP somewhat labile so will go a little slow with meds.   8. Dementia   9. Anemia with HGB at 6.8 down from 9.4 and HCT at 20.5. May need GI consult for ongoing anticoagulation    10  GI bleed this AM with bloody stool -Heparin stopped  11.  + Pulmonary HTN with PA pk pressure 59  12. Moderate AR.    Signed, Nada Boozer, NP  03/10/2016, 8:12 AM  [facility] Pager [phone]  After 5 or weekends [phone]  I have seen and examined the patient along with Nada Boozer, NP .  I have reviewed the chart, notes and new data.  I  agree with PA's note.  Key new complaints: feels well, more alert than yesterday Key examination changes: BP now high after several changes in meds yesterday, ventricular rate also faster Key new findings / data: GI bleeding/worsening anemia is dominant abnormality.  Note plans for transfusion. MRI shows old ischemic infarct and advanced chronic microvascular ischemic changes, no acute CVA.  PLAN: Current bleeding problems preclude anticoagulation. Increased beta blockers for rate and BP control.   Thurmon Fair, MD, [facility] [phone] 03/10/2016, 1:32 PM

## 2016-03-10 NOTE — Consult Note (Signed)
Consultation  Referring Provider: [facility]/Dr Elgerwahy Primary Care Physician:  Alva Garnet., MD Primary Gastroenterologist:  none  Reason for Consultation:  GI bleed   HPI: Darlene Daniels is a 82 y.o. female who was admitted on 03/07/2016 with altered mental status, with confusion and lethargy. She was found to have a UTI, and acute kidney injury. Since admission also noted to be in atrial fibrillation with RVR and was started on a heparin drip. Patient noted to have a significant drop in her hemoglobin over the past 3 days from 9.4 down to 6.8 early this a.m. Patient has not had any overtly bloody stools or frank melena but stool documented Hemoccult-positive yesterday. She has not had a bowel movement today. Hemoglobin was 9.9 in September 2017 and 10.7 in April 2017 Heparin has been stopped and she is being transfused.  Patient is on Humira as an outpatient for rheumatoid arthritis and also takes Celebrex on a daily basis. Patient is lethargic and unable to participate in giving history, her daughter who is her POA is at bedside and gives history. She states that her mother had not been complaining of any abdominal issues prior to admission. No complaints of heartburn indigestion dysphagia or abdominal pain. She does have chronic problems with mild constipation and uses Senokot as needed. They have been unaware of any bleeding at home. She believes that her last colonoscopy would've been over 10 years ago children whether or not she had any remote EGD. There are previous encounters from Dr. Loreta Ave in 2002, and Dr. Virginia Rochester  2008 and daughter sure she has not had anything done since that time and believes it would have been a colonoscopy.     Past Medical History:  Diagnosis Date  . Abnormality of gait   . Arthritis   . Memory loss   . Other persistent mental disorders due to conditions classified elsewhere   . Palpitations   . Paroxysmal tachycardia, unspecified   .  Rheumatoid arthritis (HCC)   . Tachycardia   . Urinary incontinence     History reviewed. No pertinent surgical history.  Prior to Admission medications   Medication Sig Start Date End Date Taking? Authorizing Provider  Adalimumab (HUMIRA) 40 MG/0.8ML PSKT Inject 40 mg into the skin every 14 (fourteen) days.   Yes Historical Provider, MD  Ascorbic Acid (VITAMIN C) 1000 MG tablet Take 1,000 mg by mouth daily.   Yes Historical Provider, MD  aspirin EC 81 MG tablet Take 81 mg by mouth daily.   Yes Historical Provider, MD  b complex vitamins tablet Take 1 tablet by mouth daily.   Yes Historical Provider, MD  celecoxib (CELEBREX) 200 MG capsule Take 200 mg by mouth 2 (two) times daily.    Yes Historical Provider, MD  Cholecalciferol (VITAMIN D3) 5000 units TABS Take 5,000 Units by mouth daily.    Yes Historical Provider, MD  ferrous sulfate 325 (65 FE) MG tablet Take 325 mg by mouth daily.   Yes Historical Provider, MD  folic acid (FOLVITE) 800 MCG tablet Take 800 mcg by mouth daily.    Yes Historical Provider, MD  gabapentin (NEURONTIN) 100 MG capsule Take 100 mg by mouth every evening.   Yes Historical Provider, MD  hydrALAZINE (APRESOLINE) 25 MG tablet Take 25 mg by mouth 2 (two) times daily. 12/13/15  Yes Historical Provider, MD  megestrol (MEGACE) 40 MG/ML suspension Take 400 mg by mouth every evening.   Yes Historical Provider, MD  Memantine HCl ER [facility]  XR) 28 MG CP24 Take 28 mg by mouth daily. Patient taking differently: Take 28 mg by mouth every evening.  08/05/13  Yes Levert Feinstein, MD  metoprolol tartrate (LOPRESSOR) 25 MG tablet Take 37.5 mg by mouth daily.   Yes Historical Provider, MD  Multiple Vitamin (MULTIVITAMIN) capsule Take 1 capsule by mouth daily.   Yes Historical Provider, MD  OVER THE COUNTER MEDICATION Take 80 mg by mouth daily. Zinc 80mg    Yes Historical Provider, MD  oxybutynin (DITROPAN) 5 MG tablet Take 5 mg by mouth 2 (two) times daily.    Yes Historical Provider, MD    polyvinyl alcohol (LIQUIFILM TEARS) 1.4 % ophthalmic solution Place 1 drop into both eyes 2 (two) times daily.   Yes Historical Provider, MD  senna (SENOKOT) 8.6 MG TABS tablet Take 1-2 tablets (8.6-17.2 mg total) by mouth at bedtime. 01/04/16  Yes 03/05/16, MD  bisacodyl (BISACODYL) 5 MG EC tablet Take 1-2 tablets (5-10 mg total) by mouth daily as needed for moderate constipation. Patient not taking: Reported on 03/07/2016 01/04/16   03/05/16, MD    Current Facility-Administered Medications  Medication Dose Route Frequency Provider Last Rate Last Dose  . 0.9 %  sodium chloride infusion   Intravenous Continuous Calvert Cantor, MD 50 mL/hr at 03/10/16 0700    . acetaminophen (TYLENOL) tablet 650 mg  650 mg Oral Q6H PRN 03/12/16, MD       Or  . acetaminophen (TYLENOL) suppository 650 mg  650 mg Rectal Q6H PRN Starleen Arms, MD      . B-complex with vitamin C tablet 1 tablet  1 tablet Oral Daily Starleen Arms, MD   1 tablet at 03/10/16 0920  . cefTRIAXone (ROCEPHIN) 1 g in dextrose 5 % 50 mL IVPB  1 g Intravenous Q24H 03/12/16, MD   1 g at 03/09/16 1701  . cholecalciferol (VITAMIN D) tablet 5,000 Units  5,000 Units Oral Daily 03/11/16, MD   5,000 Units at 03/10/16 0920  . ferrous sulfate tablet 325 mg  325 mg Oral QPC breakfast 03/12/16, MD   325 mg at 03/10/16 0920  . folic acid (FOLVITE) tablet 1 mg  1 mg Oral Daily 03/12/16, MD   1 mg at 03/10/16 0920  . gabapentin (NEURONTIN) capsule 100 mg  100 mg Oral QPM 03/12/16, MD   100 mg at 03/09/16 1701  . guaiFENesin (MUCINEX) 12 hr tablet 1,200 mg  1,200 mg Oral BID 03/11/16, MD   1,200 mg at 03/10/16 1245  . megestrol (MEGACE) 40 MG/ML suspension 400 mg  400 mg Oral QPM 03/12/16, MD   400 mg at 03/09/16 1701  . memantine (NAMENDA XR) 24 hr capsule 28 mg  28 mg Oral QPM 03/11/16, MD   28 mg at 03/09/16 1701  . metoprolol tartrate (LOPRESSOR)  tablet 37.5 mg  37.5 mg Oral BID 03/11/16, NP   37.5 mg at 03/10/16 03/12/16  . multivitamin with minerals tablet 1 tablet  1 tablet Oral Daily [id number], MD   1 tablet at 03/10/16 0920  . oxybutynin (DITROPAN) tablet 5 mg  5 mg Oral BID 03/12/16, MD   5 mg at 03/10/16 0921  . pantoprazole (PROTONIX) EC tablet 40 mg  40 mg Oral BID 03/12/16, MD   40 mg at 03/10/16 1245  . polyvinyl alcohol (LIQUIFILM TEARS) 1.4 % ophthalmic  solution 1 drop  1 drop Both Eyes BID Starleen Arms, MD   1 drop at 03/10/16 0929  . senna (SENOKOT) tablet 8.6-17.2 mg  1-2 tablet Oral QHS Starleen Arms, MD   8.6 mg at 03/09/16 2242  . sodium chloride 0.9 % bolus 500 mL  500 mL Intravenous Once Leanne Chang, NP   Stopped at 03/07/16 2315  . sodium chloride flush (NS) 0.9 % injection 10-40 mL  10-40 mL Intracatheter Q12H Nishant Dhungel, MD   10 mL at 03/10/16 0929  . sodium chloride flush (NS) 0.9 % injection 10-40 mL  10-40 mL Intracatheter PRN Nishant Dhungel, MD      . vitamin C (ASCORBIC ACID) tablet 1,000 mg  1,000 mg Oral Daily Starleen Arms, MD   1,000 mg at 03/10/16 7846    Allergies as of 03/07/2016 - Review Complete 03/07/2016  Allergen Reaction Noted  . Sulfa antibiotics Other (See Comments) 12/26/2012    Family History  Problem Relation Age of Onset  . Breast cancer Daughter   . Diabetes Daughter     Social History   Social History  . Marital status: Married    Spouse name: N/A  . Number of children: N/A  . Years of education: N/A   Occupational History  . Not on file.   Social History Main Topics  . Smoking status: Never Smoker  . Smokeless tobacco: Never Used  . Alcohol use No  . Drug use: No  . Sexual activity: Not on file   Other Topics Concern  . Not on file   Social History Narrative   Patient has a college education    Review of Systems: Pertinent positive and negative review of systems were noted in the above HPI section.   All other review of systems was otherwise negative.  Physical Exam: Vital signs in last 24 hours: Temp:  [97.7 F (36.5 C)-99.4 F (37.4 C)] 98.4 F (36.9 C) (11/17 1145) Pulse Rate:  [34-137] 120 (11/17 1000) Resp:  [15-26] 17 (11/17 1145) BP: (109-171)/(78-128) 133/111 (11/17 1145) SpO2:  [96 %-100 %] 96 % (11/17 1145) Last BM Date: 03/09/16 General:   Alert,  Well-developed, well-nourished,Elderly African-American female, somnolent  in NAD Head:  Normocephalic and atraumatic. Eyes:  Sclera clear, no icterus.   Conjunctiva pale . Ears:  Normal auditory acuity. Nose:  No deformity, discharge,  or lesions. Mouth:  No deformity or lesions.   Neck:  Supple; no masses or thyromegaly. Lungs:  Clear throughout to auscultation.   No wheezes, crackles, or rhonchi. Heart: ir Regular rate and rhythm; no murmurs, clicks, rubs,  or gallops. Abdomen:  Soft,nontender, BS active,nonpalp mass or hsm.   Rectal:  Deferred  Msk:  Symmetrical without gross deformities. . Pulses:  Normal pulses noted. Extremities:  Without clubbing or edema. Neurologic: Somnolent but arousable, unable to answer much other than yes no questions Skin:  Intact without significant lesions or rashes..   Intake/Output from previous day: 11/16 0701 - 11/17 0700 In: 1727.4 [I.V.:1647.4; Blood:30; IV Piggyback:50] Out: -  Intake/Output this shift: Total I/O In: 610 [I.V.:250; Blood:360] Out: -   Lab Results:  Recent Labs  03/08/16 1423 03/09/16 0600 03/10/16 0347  WBC 8.1 8.9 8.9  HGB 8.2* 7.5* 6.8*  HCT 25.1* 22.6* 20.5*  PLT 239 267 227   BMET  Recent Labs  03/08/16 1423 03/09/16 0642 03/10/16 0347  NA 141 143 142  K 4.9 5.5* 4.9  CL 113* 116* 117*  CO2  21* 20* 20*  GLUCOSE 144* 127* 143*  BUN 47* 42* 42*  CREATININE 2.57* 2.32* 2.12*  CALCIUM 7.8* 7.8* 8.1*   LFT No results for input(s): PROT, ALBUMIN, AST, ALT, ALKPHOS, BILITOT, BILIDIR, IBILI in the last 72 hours. PT/INR  Recent Labs   03/07/16 1818  LABPROT 16.4*  INR 1.31   Hepatitis Panel No results for input(s): HEPBSAG, HCVAB, HEPAIGM, HEPBIGM in the last 72 hours.   IMPRESSION:  #75  82 year old AA female, admitted with altered mental status felt secondary to UTI #2 acute kidney injury #3 new-onset atrial fibrillation with RVR-started on heparin this admission #4 congestive heart failure-EF 40-45% #5 dementia #6 rheumatoid arthritis-patient had been on Humira and also taking Celebrex at home #7 chronic anemia #8 acute 4 g drop in hemoglobin since admission with stools documented Hemoccult-positive no active obvious hemorrhage, this occurred in the setting of heparin drip. Etiology of blood loss not clear though high risk for peptic ulcer disease and/or gastropathy with chronic NSAID use.  Plan; Heparin has been discontinued Patient is being transfused this afternoon Will continue clear liquid diet Serial hemoglobins and transfuse to keep hemoglobin 8 Will add IV Protonix 40 mg every 24 hours Discussed management with the patient's daughter who is POA. She is not anxious to pursue aggressive workup with endoscopic evaluation unless patient has continued active blood loss off heparin. We will follow with you, and assess indication for endoscopic evaluation depending on course over the next 24-48 hrs.        Dianne Whelchel  03/10/2016, 2:02 PM

## 2016-03-10 NOTE — Progress Notes (Signed)
CRITICAL VALUE ALERT  Critical value received:  Hbg 6.8  Date of notification:  03/10/16  Time of notification:  0445  Critical value read back:no  Nurse who received alert:  Ezequiel Kayser, RN  MD notified (1st page): Schorr  Time of first page:  [phone]  Responding MD:  Schorr  Time MD responded: [phone]

## 2016-03-10 NOTE — Progress Notes (Addendum)
PROGRESS NOTE                                                                                                                                                                                                             Patient Demographics:    Darlene Daniels, is a 82 y.o. female, DOB - 1924-12-08, ZOX:[id number]  Admit date - 03/07/2016   Admitting Physician Starleen Arms, MD  Outpatient Primary MD for the patient is Alva Garnet., MD  LOS - 3  Outpatient Specialists: none  Chief Complaint  Patient presents with  . Altered Mental Status       Brief Narrative  82 year old female with history of hypertension, recurrent syncope, tachybrady syndrome, chronic kidney disease stage III, rheumatoid arthritis, orthostatic hypertension, dementia, urinary incontinence , Presents to ED with encephalopathy, secondary to UTI, as well workup significant for new onset A. fib , and elevated troponins , Head CT showing subacute/chronic left occipital infarct (new from CT since February 2017) but MRI showing chronic infarct, Seen by cardiology for A. fib management, initially on heparin GTT which has been stopped secondary to GI bleed..    Subjective:   Patient Reports she is feeling better today, complains only of cough, denies any chest pain or shortness of breath   Assessment  & Plan :   Atrial fibrillation with RVR (HCC) - New onset. - Initially on Cardizem drip, currently on by mouth metoprolol, increase gradually and slowly by cards for better heart rate control (given known history of bradycardia with syncope in the past ) - CHA2DS2VASc of 6. Initially on heparin GTT, currently stopped given GI bleed Further recommendations pending. -2-D echo repeated with EF of 40-45% and diffuse hypokinesis (new compared to last echo 2 months back). Likely due to rapid A. Fib.  UTI -Treated with IV Rocephin, and cultures with no  growth  Acute metabolic encephalopathy -Worsened mentation on admission. Patient with underlying dementia, and with encephalopathy due to  UTI and chronic stroke.  - MRI brain with chronic multiple lacunar infarcts - Improving  GI bleed - Patient with significant drop on hemoglobin, Hgb 6.8 today, hold heparin GTT, start on Protonix twice a day, will change to clear liquid diet and transfused 2 units PRBC, Gi consulted.  Acute on chronic kidney injury stage III -Suspect secondary to dehydration and UTI.  -  Back to baseline on IV hydration  Hyperkalemia -Resolved  New Systolic CHF -Noted for low EF of 40-45%, Likely due to rapid A. fib.  -has remained Euvolemic on exam. -will follow daily weights and strict intake and output   Elevated troponin -Chronic and in the setting of A. Fib with RVR.  -No chest pain symptoms or other acute EKG changes. -cardiology on board  Old  CVA (cerebral vascular accident) (HCC) -chronic  left occipital infarct seen on head CT.  -MRI brain with evidence of chronic multiple lacunar infarcts -continue risk factors modifications   Dementia -Continue Namenda.  Rheumatoid arthritis -was On Celebrex. Will continue holding given poor renal function  Anemia - Acute on chronic iron deficiency, worsened most likely due to GI bleed - Hold heparin GTT, leading 6.8 today, will transfuse 2 units PRBc  Acute on chronic sinusitis - On Rocephin, will change to by mouth antibiotic in 1-2 days  Essential hypertension  -continue hydralazine.  Poor IV access and limitation to blood draw. -Discussed options for IV access with daughter and she prefers having a PICC line or midline for blood draws.  -PICC line in place now   Code Status : Full code (discussed with daughter at length)  Family Communication  : Spoke with daughter at bedside 11/17  Disposition Plan  : Continue step down monitoring, for now.  Consults  :  Cardiology  Procedures  : 2-D echo:    DVT Prophylaxis  : IV heparin stopped 11/17 , SCD  Lab Results  Component Value Date   PLT 227 03/10/2016    Antibiotics  :    Anti-infectives    Start     Dose/Rate Route Frequency Ordered Stop   03/08/16 1700  cefTRIAXone (ROCEPHIN) 1 g in dextrose 5 % 50 mL IVPB     1 g 100 mL/hr over 30 Minutes Intravenous Every 24 hours 03/07/16 2052     03/07/16 1645  cefTRIAXone (ROCEPHIN) 1 g in dextrose 5 % 50 mL IVPB     1 g 100 mL/hr over 30 Minutes Intravenous  Once 03/07/16 1638 03/07/16 1741        Objective:   Vitals:   03/10/16 0600 03/10/16 0638 03/10/16 0700 03/10/16 1000  BP: (!) 151/116 (!) 163/109 (!) 171/116 (!) 147/116  Pulse: (!) 121 (!) 135  (!) 120  Resp: 16 16 (!) 22 16  Temp: 98.6 F (37 C) 99.4 F (37.4 C)  99.1 F (37.3 C)  TempSrc: Oral Oral  Axillary  SpO2: 99% 98%  100%  Weight:      Height:        Wt Readings from Last 3 Encounters:  03/07/16 74.3 kg (163 lb 12.8 oz)  01/04/16 77.6 kg (171 lb)  08/12/15 79.4 kg (175 lb)     Intake/Output Summary (Last 24 hours) at 03/10/16 1115 Last data filed at 03/10/16 0940  Gross per 24 hour  Intake          1721.38 ml  Output                0 ml  Net          1721.38 ml     Physical Exam  Gen: not in distress, oriented X1, able to follow commands; slow to response  HEENT:  moist mucosa, supple neck, no JVD Chest: clear b/l, no use of accessory muscles CVS: S1 and S2 irregular, No murmurs rub or gallop GI: soft, NT, ND, BS+ Musculoskeletal: warm,  no edema CNS: Alert and awake, oriented x1, moving four limbs and able to follow simple commands     Data Review:    CBC  Recent Labs Lab 03/07/16 1157 03/08/16 1423 03/09/16 0600 03/10/16 0347  WBC 7.9 8.1 8.9 8.9  HGB 9.4* 8.2* 7.5* 6.8*  HCT 29.5* 25.1* 22.6* 20.5*  PLT 239 239 267 227  MCV 96.7 97.7 95.8 96.2  MCH 30.8 31.9 31.8 31.9  MCHC 31.9 32.7 33.2 33.2  RDW 15.9* 16.1* 16.2* 16.3*    Chemistries   Recent Labs Lab  03/07/16 1157 03/08/16 1423 03/09/16 0642 03/10/16 0347  NA 139 141 143 142  K 5.8* 4.9 5.5* 4.9  CL 110 113* 116* 117*  CO2 20* 21* 20* 20*  GLUCOSE 198* 144* 127* 143*  BUN 55* 47* 42* 42*  CREATININE 3.65* 2.57* 2.32* 2.12*  CALCIUM 8.8* 7.8* 7.8* 8.1*  MG  --  2.0  --   --   AST 25  --   --   --   ALT 18  --   --   --   ALKPHOS 59  --   --   --   BILITOT 0.4  --   --   --     Lab Results  Component Value Date   HGBA1C 6.3 (H) 05/30/2015   Coagulation profile  Recent Labs Lab 03/07/16 1818  INR 1.31   Cardiac Enzymes  Recent Labs Lab 03/07/16 2155  TROPONINI 0.18*   ------------------------------------------------------------------------------------------------------------------    Component Value Date/Time   BNP 719.0 (H) 05/31/2015 1304    Inpatient Medications  Scheduled Meds: . B-complex with vitamin C  1 tablet Oral Daily  . cefTRIAXone (ROCEPHIN)  IV  1 g Intravenous Q24H  . cholecalciferol  5,000 Units Oral Daily  . ferrous sulfate  325 mg Oral QPC breakfast  . folic acid  1 mg Oral Daily  . gabapentin  100 mg Oral QPM  . guaiFENesin  1,200 mg Oral BID  . megestrol  400 mg Oral QPM  . memantine  28 mg Oral QPM  . metoprolol tartrate  37.5 mg Oral BID  . multivitamin with minerals  1 tablet Oral Daily  . oxybutynin  5 mg Oral BID  . polyvinyl alcohol  1 drop Both Eyes BID  . senna  1-2 tablet Oral QHS  . sodium chloride  500 mL Intravenous Once  . sodium chloride flush  10-40 mL Intracatheter Q12H  . vitamin C  1,000 mg Oral Daily   Continuous Infusions: . sodium chloride 50 mL/hr at 03/10/16 0700   Micro Results Recent Results (from the past 240 hour(s))  Blood culture (routine x 2)     Status: None (Preliminary result)   Collection Time: 03/07/16  3:36 PM  Result Value Ref Range Status   Specimen Description BLOOD RIGHT ANTECUBITAL  Final   Special Requests BOTTLES DRAWN AEROBIC AND ANAEROBIC 5 CC  Final   Culture   Final    NO  GROWTH 2 DAYS Performed at [facility]    Report Status PENDING  Incomplete  Blood culture (routine x 2)     Status: None (Preliminary result)   Collection Time: 03/07/16  3:36 PM  Result Value Ref Range Status   Specimen Description BLOOD LEFT ANTECUBITAL  Final   Special Requests BOTTLES DRAWN AEROBIC ONLY 6 CC  Final   Culture   Final    NO GROWTH 2 DAYS Performed at [facility]  Report Status PENDING  Incomplete  Urine culture     Status: None   Collection Time: 03/07/16  3:51 PM  Result Value Ref Range Status   Specimen Description URINE, CATHETERIZED  Final   Special Requests NONE  Final   Culture NO GROWTH Performed at [facility]   Final   Report Status 03/08/2016 FINAL  Final  MRSA PCR Screening     Status: None   Collection Time: 03/07/16  8:58 PM  Result Value Ref Range Status   MRSA by PCR NEGATIVE NEGATIVE Final    Comment:        The GeneXpert MRSA Assay (FDA approved for NASAL specimens only), is one component of a comprehensive MRSA colonization surveillance program. It is not intended to diagnose MRSA infection nor to guide or monitor treatment for MRSA infections.     Radiology Reports Dg Chest 2 View  Result Date: 03/07/2016 CLINICAL DATA:  Altered mental status, lethargy for the past 3 days. EXAM: CHEST  2 VIEW COMPARISON:  PA and lateral chest x-ray of January 04, 2016 FINDINGS: The lungs are adequately inflated. There is no focal infiltrate. There is no pleural effusion. The cardiac silhouette remains enlarged. The pulmonary vascularity is prominent centrally but there is no cephalization of the vascular pattern. There is calcification in the wall of the aortic arch. The mediastinum is normal in width. There is no pleural effusion. The bony thorax exhibits no acute abnormality. There degenerative changes of both shoulders. IMPRESSION: Cardiomegaly without pulmonary edema.  No acute pneumonia. Thoracic aortic  atherosclerosis. Electronically Signed   By: David  SwazilandJordan M.D.   On: 03/07/2016 14:06   Ct Head Wo Contrast  Result Date: 03/07/2016 CLINICAL DATA:  Altered mental status, weakness and lethargy for 3 days. EXAM: CT HEAD WITHOUT CONTRAST TECHNIQUE: Contiguous axial images were obtained from the base of the skull through the vertex without intravenous contrast. COMPARISON:  05/30/2015 FINDINGS: Brain: Stable age related cerebral atrophy, ventriculomegaly and periventricular white matter disease. New area of low attenuation in the left occipital lobe consistent with an infarct. This does not appear acute and could be subacute or chronic. No extra-axial fluid collections. No intracranial hemorrhage. No mass lesions. Vascular: Stable extensive vascular calcifications but no aneurysm or hyperdense vessels. Skull: No skull fracture or bone lesion. Sinuses/Orbits: Extensive paranasal sinus disease with possible sinonasal polyposis. Evidence of prior sinus surgery. The globes are intact. Other: No scalp lesions. IMPRESSION: Left occipital infarct is new since the prior CT from February 2017 but does not appear acute. It is probably subacute or chronic. No intracranial hemorrhage or mass lesion. Chronic sinus disease with possible sinonasal polyposis. Electronically Signed   By: Rudie MeyerP.  Gallerani M.D.   On: 03/07/2016 14:35   Mr Brain Wo Contrast  Result Date: 03/09/2016 CLINICAL DATA:  82 y/o F; 1 week of increasing confusion and lethargy. EXAM: MRI HEAD WITHOUT CONTRAST TECHNIQUE: Multiplanar, multiecho pulse sequences of the brain and surrounding structures were obtained without intravenous contrast. COMPARISON:  05/03/2012 MRI of the head.  03/07/2016 CT of the head. FINDINGS: Brain: No diffusion signal abnormality. Patchy T2 FLAIR hyperintense white matter signal abnormality minute predominantly perivascular distribution compatible with advanced chronic microvascular ischemic changes. Chronic left occipital  infarct. Multiple chronic small lacunar infarcts within the basal ganglia bilaterally, right pons, and bilateral cerebellar hemispheres. Extensive diffuse brain parenchymal volume loss. Punctate focus of susceptibility hypointensity and right posterior temporal lobe is compatible hemosiderin deposition from old microhemorrhage. Vascular: Normal flow voids.  Skull and upper cervical spine: Normal marrow signal. Sinuses/Orbits: Extensive paranasal sinus mucosal thickening and opacification of right maxillary, sphenoid, ethmoid, and frontal sinuses with fluid levels. Partial opacification of mastoid tips bilaterally. Other: None. IMPRESSION: 1. No acute intracranial abnormality identified. 2. Advanced chronic microvascular ischemic changes, parenchymal volume loss, multiple chronic lacunar infarcts in the cerebellum, pons, and basal ganglia, as well as left occipital chronic infarct. Findings are progressed from 2014. 3. Severe paranasal sinus disease with fluid levels which may represent acute on chronic sinusitis in the appropriate clinical setting. Electronically Signed   By: Mitzi Hansen M.D.   On: 03/09/2016 19:08   Dg Chest Port 1 View  Result Date: 03/08/2016 CLINICAL DATA:  Central catheter placement EXAM: PORTABLE CHEST 1 VIEW COMPARISON:  March 07, 2016 FINDINGS: Central catheter tip is at the cavoatrial junction. No pneumothorax. There is atelectatic change in the left mid lung. Lungs elsewhere are clear. Stable cardiomegaly with pulmonary vascularity within normal limits. There is atherosclerotic calcification aorta. No adenopathy. No bone lesions. IMPRESSION: Central catheter tip at cavoatrial junction without pneumothorax. Stable cardiomegaly. Left midlung atelectasis. No edema or consolidation. Electronically Signed   By: Bretta Bang III M.D.   On: 03/08/2016 13:12     Jeremey Bascom M.D on 03/10/2016 at 11:15 AM  Between 7am to 7pm - Pager - [phone]  After 7pm go  to [email or web address] - password [facility]  [facility] -  Office  [phone]

## 2016-03-11 DIAGNOSIS — I480 Paroxysmal atrial fibrillation: Secondary | ICD-10-CM

## 2016-03-11 DIAGNOSIS — K922 Gastrointestinal hemorrhage, unspecified: Secondary | ICD-10-CM

## 2016-03-11 LAB — HEMOGLOBIN AND HEMATOCRIT, BLOOD
HEMATOCRIT: 28.8 % — AB (ref 36.0–46.0)
HEMATOCRIT: 30.1 % — AB (ref 36.0–46.0)
HEMOGLOBIN: 9.8 g/dL — AB (ref 12.0–15.0)
Hemoglobin: 9.6 g/dL — ABNORMAL LOW (ref 12.0–15.0)

## 2016-03-11 MED ORDER — GUAIFENESIN 100 MG/5ML PO SOLN
15.0000 mL | ORAL | Status: DC
  Administered 2016-03-11 – 2016-03-13 (×11): 300 mg via ORAL
  Filled 2016-03-11 (×3): qty 10
  Filled 2016-03-11 (×3): qty 20
  Filled 2016-03-11: qty 10
  Filled 2016-03-11: qty 20
  Filled 2016-03-11: qty 10
  Filled 2016-03-11: qty 30
  Filled 2016-03-11 (×3): qty 10

## 2016-03-11 MED ORDER — AMOXICILLIN-POT CLAVULANATE 875-125 MG PO TABS
1.0000 | ORAL_TABLET | Freq: Two times a day (BID) | ORAL | Status: DC
  Administered 2016-03-11 – 2016-03-12 (×4): 1 via ORAL
  Filled 2016-03-11 (×4): qty 1

## 2016-03-11 MED ORDER — METOPROLOL TARTRATE 25 MG PO TABS
50.0000 mg | ORAL_TABLET | Freq: Two times a day (BID) | ORAL | Status: DC
  Administered 2016-03-11 (×2): 50 mg via ORAL
  Filled 2016-03-11 (×2): qty 2

## 2016-03-11 NOTE — Progress Notes (Signed)
Patient Name: Darlene Daniels Date of Encounter: 03/11/2016  Primary Cardiologist: Dr. Alicia Amel Problem List     Active Problems:   Rheumatoid arthritis [facility])   Acute on chronic renal failure (HCC)   Dementia   UTI (urinary tract infection)   Acute encephalopathy   Atrial fibrillation (HCC)   CVA (cerebral vascular accident) (HCC)   Left ventricular systolic dysfunction   Acute post-hemorrhagic anemia   Heme positive stool     Subjective   No complaints, denies chset pain or SOB; patient confused  Inpatient Medications    Scheduled Meds: . B-complex with vitamin C  1 tablet Oral Daily  . cefTRIAXone (ROCEPHIN)  IV  1 g Intravenous Q24H  . cholecalciferol  5,000 Units Oral Daily  . ferrous sulfate  325 mg Oral QPC breakfast  . folic acid  1 mg Oral Daily  . gabapentin  100 mg Oral QPM  . guaiFENesin  1,200 mg Oral BID  . megestrol  400 mg Oral QPM  . memantine  28 mg Oral QPM  . metoprolol tartrate  37.5 mg Oral BID  . multivitamin with minerals  1 tablet Oral Daily  . oxybutynin  5 mg Oral BID  . pantoprazole (PROTONIX) IV  40 mg Intravenous Q24H  . polyvinyl alcohol  1 drop Both Eyes BID  . senna  1-2 tablet Oral QHS  . sodium chloride  500 mL Intravenous Once  . sodium chloride flush  10-40 mL Intracatheter Q12H  . vitamin C  1,000 mg Oral Daily   Continuous Infusions: . sodium chloride 50 mL/hr at 03/10/16 0700   PRN Meds: acetaminophen **OR** acetaminophen, sodium chloride flush   Vital Signs    Vitals:   03/11/16 0300 03/11/16 0400 03/11/16 0500 03/11/16 0600  BP: (!) 128/102 (!) 154/136 (!) 170/121 (!) 157/127  Pulse:      Resp: 16 17 17 16   Temp:  99 F (37.2 C)    TempSrc:  Axillary    SpO2:      Weight:      Height:        Intake/Output Summary (Last 24 hours) at 03/11/16 0747 Last data filed at 03/11/16 0600  Gross per 24 hour  Intake             2495 ml  Output                0 ml  Net             2495 ml   Filed Weights     03/07/16 2050  Weight: 163 lb 12.8 oz (74.3 kg)    Physical Exam   GEN: Well nourished,  in no acute distress.  HEENT: normal Neck: Supple Cardiac: irreg Respiratory:  CTA anteriorly GI: Abd -Soft, nontender, nondistended Skin: warm and dry Neuro:  confused Ext: trace edema   Labs    CBC  Recent Labs  03/09/16 0600 03/10/16 0347 03/10/16 1758 03/11/16 0232  WBC 8.9 8.9  --   --   HGB 7.5* 6.8* 9.5* 9.6*  HCT 22.6* 20.5* 28.3* 28.8*  MCV 95.8 96.2  --   --   PLT 267 227  --   --    Basic Metabolic Panel  Recent Labs  03/08/16 1423 03/09/16 0642 03/10/16 0347  NA 141 143 142  K 4.9 5.5* 4.9  CL 113* 116* 117*  CO2 21* 20* 20*  GLUCOSE 144* 127* 143*  BUN 47* 42* 42*  CREATININE  2.57* 2.32* 2.12*  CALCIUM 7.8* 7.8* 8.1*  MG 2.0  --   --     Telemetry    A fib with RVR rates 120-130 with either aberrancy vs. PVCs - Personally Reviewed   Radiology    Mr Brain Wo Contrast  Result Date: 03/09/2016 CLINICAL DATA:  82 y/o F; 1 week of increasing confusion and lethargy. EXAM: MRI HEAD WITHOUT CONTRAST TECHNIQUE: Multiplanar, multiecho pulse sequences of the brain and surrounding structures were obtained without intravenous contrast. COMPARISON:  05/03/2012 MRI of the head.  03/07/2016 CT of the head. FINDINGS: Brain: No diffusion signal abnormality. Patchy T2 FLAIR hyperintense white matter signal abnormality minute predominantly perivascular distribution compatible with advanced chronic microvascular ischemic changes. Chronic left occipital infarct. Multiple chronic small lacunar infarcts within the basal ganglia bilaterally, right pons, and bilateral cerebellar hemispheres. Extensive diffuse brain parenchymal volume loss. Punctate focus of susceptibility hypointensity and right posterior temporal lobe is compatible hemosiderin deposition from old microhemorrhage. Vascular: Normal flow voids. Skull and upper cervical spine: Normal marrow signal. Sinuses/Orbits:  Extensive paranasal sinus mucosal thickening and opacification of right maxillary, sphenoid, ethmoid, and frontal sinuses with fluid levels. Partial opacification of mastoid tips bilaterally. Other: None. IMPRESSION: 1. No acute intracranial abnormality identified. 2. Advanced chronic microvascular ischemic changes, parenchymal volume loss, multiple chronic lacunar infarcts in the cerebellum, pons, and basal ganglia, as well as left occipital chronic infarct. Findings are progressed from 2014. 3. Severe paranasal sinus disease with fluid levels which may represent acute on chronic sinusitis in the appropriate clinical setting. Electronically Signed   By: Mitzi Hansen M.D.   On: 03/09/2016 19:08    Cardiac Studies   ECHO 03/08/16  Study Conclusions  - Left ventricle: The cavity size was normal. Systolic function was mildly to moderately reduced. The estimated ejection fraction was in the range of 40% to 45%. Diffuse hypokinesis. - Aortic valve: Trileaflet; mild thickening and moderate calcification of the noncoronary cusp. Transvalvular velocity was within the normal range. There was no stenosis. There was moderate regurgitation. Regurgitation pressure half-time: 431 ms. - Mitral valve: Calcified annulus. Transvalvular velocity was within the normal range. There was no evidence for stenosis. There was mild regurgitation. - Left atrium: The atrium was severely dilated. - Right ventricle: The cavity size was normal. Wall thickness was normal. Systolic function was mildly reduced. - Right atrium: The atrium was severely dilated. - Atrial septum: No defect or patent foramen ovale was identified by color flow Doppler. - Tricuspid valve: There was mild regurgitation. - Pulmonary arteries: Systolic pressure was within the normal range. PA peak pressure: 59 mm Hg (S).  Impressions:  - Reduced systolic function may be due to atrial fibrillation with rapid  ventricular response. Consider repeating echo when rate has improved for a more accurate assessment of LVEF.    Patient Profile     82 year old female with history of hypertension, recurrent syncope, tachycardia bradycardia syndrome (PSVT), chronic kidney disease stage III, rheumatoid arthritis, orthostatic hypertension, dementia, urinary incontinence was brought to the ED by her daughter with increased confusion and lethargy for past 1 week, associated with poor appetite.- found to be in A fib with RVR.   Assessment & Plan    1. A fib with RVR -Heart rate remains elevated. Increase metoprolol to 50 mg twice a day. Follow on telemetry closely given history of tachybradycardia syndrome. We cannot anticoagulate given recent anemia and bleeding.  2. Hx of tachy brady syndrome with hx of PSVT- follow closely on telemetry.  3. Elevated troponin -no clear trend and not consistent with ACS; pt not a candidate for aggressive cardiac eval.  4. AMS may be due to UTI and her chronic dementia. Per IM  5. UTI per Im  6. Acute renal failure --per IM    7. HTN -elevated BP; change metoprolol to 50 BID and follow.  8. Dementia    Signed, Olga Millers, MD  03/11/2016, 7:47 AM

## 2016-03-11 NOTE — Progress Notes (Signed)
Patient ID: Darlene Daniels, female   DOB: March 24, 1925, 82 y.o.   MRN: [id number]     Progress Note   Subjective   Family feels she is better today - pt has no c/o nausea or abdominal pain. No active bleeding HGB 9.6  Post transfusions Heparin off   Objective   Vital signs in last 24 hours: Temp:  [97.5 F (36.4 C)-99 F (37.2 C)] 97.8 F (36.6 C) (11/18 0800) Pulse Rate:  [25-133] 130 (11/18 0900) Resp:  [14-26] 16 (11/18 0900) BP: (127-170)/(91-138) 157/124 (11/18 0900) SpO2:  [79 %-100 %] 100 % (11/18 0900) Last BM Date: 03/11/16 General:    Elderly AA female  More alert today and trying to engage in conversation  Heart:  Regular rate and rhythm; no murmurs Lungs: Respirations even and unlabored, lungs CTA bilaterally Abdomen:  Soft, nontender and nondistended. Normal bowel sounds. Extremities:  Without edema. Neurologic:  Alert and oriented,  grossly normal neurologically. Psych:  Cooperative. Normal mood and affect.  Intake/Output from previous day: 11/17 0701 - 11/18 0700 In: 2495 [P.O.:360; I.V.:1400; Blood:685; IV Piggyback:50] Out: -  Intake/Output this shift: Total I/O In: 110 [I.V.:110] Out: -   Lab Results:  Recent Labs  03/08/16 1423 03/09/16 0600 03/10/16 0347 03/10/16 1758 03/11/16 0232  WBC 8.1 8.9 8.9  --   --   HGB 8.2* 7.5* 6.8* 9.5* 9.6*  HCT 25.1* 22.6* 20.5* 28.3* 28.8*  PLT 239 267 227  --   --    BMET  Recent Labs  03/08/16 1423 03/09/16 0642 03/10/16 0347  NA 141 143 142  K 4.9 5.5* 4.9  CL 113* 116* 117*  CO2 21* 20* 20*  GLUCOSE 144* 127* 143*  BUN 47* 42* 42*  CREATININE 2.57* 2.32* 2.12*  CALCIUM 7.8* 7.8* 8.1*   LFT No results for input(s): PROT, ALBUMIN, AST, ALT, ALKPHOS, BILITOT, BILIDIR, IBILI in the last 72 hours. PT/INR No results for input(s): LABPROT, INR in the last 72 hours.  Studies/Results: Mr Brain Wo Contrast  Result Date: 03/09/2016 CLINICAL DATA:  82 y/o F; 1 week of increasing confusion and  lethargy. EXAM: MRI HEAD WITHOUT CONTRAST TECHNIQUE: Multiplanar, multiecho pulse sequences of the brain and surrounding structures were obtained without intravenous contrast. COMPARISON:  05/03/2012 MRI of the head.  03/07/2016 CT of the head. FINDINGS: Brain: No diffusion signal abnormality. Patchy T2 FLAIR hyperintense white matter signal abnormality minute predominantly perivascular distribution compatible with advanced chronic microvascular ischemic changes. Chronic left occipital infarct. Multiple chronic small lacunar infarcts within the basal ganglia bilaterally, right pons, and bilateral cerebellar hemispheres. Extensive diffuse brain parenchymal volume loss. Punctate focus of susceptibility hypointensity and right posterior temporal lobe is compatible hemosiderin deposition from old microhemorrhage. Vascular: Normal flow voids. Skull and upper cervical spine: Normal marrow signal. Sinuses/Orbits: Extensive paranasal sinus mucosal thickening and opacification of right maxillary, sphenoid, ethmoid, and frontal sinuses with fluid levels. Partial opacification of mastoid tips bilaterally. Other: None. IMPRESSION: 1. No acute intracranial abnormality identified. 2. Advanced chronic microvascular ischemic changes, parenchymal volume loss, multiple chronic lacunar infarcts in the cerebellum, pons, and basal ganglia, as well as left occipital chronic infarct. Findings are progressed from 2014. 3. Severe paranasal sinus disease with fluid levels which may represent acute on chronic sinusitis in the appropriate clinical setting. Electronically Signed   By: Mitzi Hansen M.D.   On: 03/09/2016 19:08      INP/PLan;  IMP/PLAN;  #1 82 yo female with AMS secondary to UTI, metabolic encephalopathy, and underlying  dementia #2 new onset afib with RVR-  #3 anemia and 4 gram drop in hgb  While on heparin - off since yesterday, appropriate response to transfusions,and no over bleeding On Protonix IV for  probable NSIAD induced gastropathy/possible PUD #4 CKD #5 new CHF #6 RA   Plan; full liquids Continue observation, serial hgbs' Do not plan endoscopic intervention unless  acvtive bleed off heparin, or cardiology feels she has to have chronic anticoagulation. Family at bedside ,updated  Active Problems:   Rheumatoid arthritis (HCC)   Acute on chronic renal failure (HCC)   Dementia   UTI (urinary tract infection)   Acute encephalopathy   Atrial fibrillation (HCC)   CVA (cerebral vascular accident) (HCC)   Left ventricular systolic dysfunction   Acute post-hemorrhagic anemia   Heme positive stool     LOS: 4 days   Amy Esterwood  03/11/2016, 10:22 AM

## 2016-03-11 NOTE — Progress Notes (Signed)
PROGRESS NOTE                                                                                                                                                                                                             Patient Demographics:    Darlene Daniels, is a 82 y.o. female, DOB - Aug 25, 1924, HDQ:[id number]  Admit date - 03/07/2016   Admitting Physician Starleen Arms, MD  Outpatient Primary MD for the patient is Alva Garnet., MD  LOS - 4  Outpatient Specialists: none  Chief Complaint  Patient presents with  . Altered Mental Status       Brief Narrative  82 year old female with history of hypertension, recurrent syncope, tachybrady syndrome, chronic kidney disease stage III, rheumatoid arthritis, orthostatic hypertension, dementia, urinary incontinence , Presents to ED with encephalopathy, secondary to UTI, as well workup significant for new onset A. fib , and elevated troponins , Head CT showing subacute/chronic left occipital infarct (new from CT since February 2017) but MRI showing chronic infarct, Seen by cardiology for A. fib management, initially on heparin GTT which has been stopped secondary to GI bleed..    Subjective:   Patient Reports she is feeling better today, complains only of cough, denies any chest pain or shortness of breath   Assessment  & Plan :   Atrial fibrillation with RVR (HCC) - New onset. - Initially on Cardizem drip, currently on by mouth metoprolol, increase gradually and slowly by cards for better heart rate control (given known history of bradycardia with syncope in the past ) - CHA2DS2VASc of 6. Initially on heparin GTT,Anticoagulation candidate currently given recent anemia, GI bleeding and recurrent falls. -2-D echo repeated with EF of 40-45% and diffuse hypokinesis (new compared to last echo 2 months back). Likely due to rapid A. Fib.  UTI -Treated with IV Rocephin,  and cultures with no growth  Acute metabolic encephalopathy -Worsened mentation on admission. Patient with underlying dementia, and with encephalopathy due to  UTI and chronic stroke.  - MRI brain with chronic multiple lacunar infarcts - Improving  GI bleed - GI consult appreciated, this is most likely lower GI bleed (given minimal amount of fresh blood with stool ) in the setting of anticoagulation, hemoglobin stable posttransfusion since holding heparin GTT .plan with conservative management . - 2 units PRBC transfusion 11/17  Acute  on chronic kidney injury stage III -Suspect secondary to dehydration and UTI.  -Back to baseline on IV hydration  Hyperkalemia -Resolved  New Systolic CHF -Noted for low EF of 40-45%, Likely due to rapid A. fib. On beta blockers, not ACE I/ARB candidate given poor renal function. -has remained Euvolemic on exam. -will follow daily weights and strict intake and output   Elevated troponin -Chronic and in the setting of A. Fib with RVR.  -No chest pain symptoms or other acute EKG changes. -cardiology on board  Old  CVA (cerebral vascular accident) (HCC) -chronic  left occipital infarct seen on head CT.  -MRI brain with evidence of chronic multiple lacunar infarcts -continue risk factors modifications   Dementia -Continue Namenda.  Rheumatoid arthritis -was On Celebrex. Will continue holding given poor renal function  Anemia - Acute on chronic iron deficiency, worsened most likely due to GI bleed - Hold heparin GTT, last used units PRBC 11/17 given hemoglobin of 6.8,  Acute on chronic sinusitis - Patient on Rocephin, will change 11/18 to Augmentin   Essential hypertension  -continue hydralazine, on metoprolol  Poor IV access and limitation to blood draw. -Discussed options for IV access with daughter and she prefers having a PICC line or midline for blood draws.  -PICC line in place now   Code Status : Full code (discussed with daughter  at length)  Family Communication  : Spoke with daughter at bedside 11/17  Disposition Plan  : Continue step down monitoring, for now.  Consults  :  Cardiology,GI  Procedures  : 2-D echo:   DVT Prophylaxis  : IV heparin stopped 11/17 , SCD  Lab Results  Component Value Date   PLT 227 03/10/2016    Antibiotics  :    Anti-infectives    Start     Dose/Rate Route Frequency Ordered Stop   03/08/16 1700  cefTRIAXone (ROCEPHIN) 1 g in dextrose 5 % 50 mL IVPB     1 g 100 mL/hr over 30 Minutes Intravenous Every 24 hours 03/07/16 2052     03/07/16 1645  cefTRIAXone (ROCEPHIN) 1 g in dextrose 5 % 50 mL IVPB     1 g 100 mL/hr over 30 Minutes Intravenous  Once 03/07/16 1638 03/07/16 1741        Objective:   Vitals:   03/11/16 0600 03/11/16 0700 03/11/16 0800 03/11/16 0900  BP: (!) 157/127 (!) 169/138 (!) 167/130 (!) 157/124  Pulse:   (!) 126 (!) 130  Resp: 16 17 19 16   Temp:   97.8 F (36.6 C)   TempSrc:   Oral   SpO2:   98% 100%  Weight:      Height:        Wt Readings from Last 3 Encounters:  03/07/16 74.3 kg (163 lb 12.8 oz)  01/04/16 77.6 kg (171 lb)  08/12/15 79.4 kg (175 lb)     Intake/Output Summary (Last 24 hours) at 03/11/16 1117 Last data filed at 03/11/16 0947  Gross per 24 hour  Intake             2155 ml  Output                0 ml  Net             2155 ml     Physical Exam  Gen: not in distress, More awake, alert and appropriate today  HEENT:  moist mucosa, supple neck, no JVD Chest: clear b/l, no use of  accessory muscles CVS: S1 and S2 irregular, No murmurs rub or gallop GI: soft, NT, ND, BS+ Musculoskeletal: warm, no edema CNS:, moving four limbs and able to follow simple commands     Data Review:    CBC  Recent Labs Lab 03/07/16 1157 03/08/16 1423 03/09/16 0600 03/10/16 0347 03/10/16 1758 03/11/16 0232  WBC 7.9 8.1 8.9 8.9  --   --   HGB 9.4* 8.2* 7.5* 6.8* 9.5* 9.6*  HCT 29.5* 25.1* 22.6* 20.5* 28.3* 28.8*  PLT 239 239 267 227   --   --   MCV 96.7 97.7 95.8 96.2  --   --   MCH 30.8 31.9 31.8 31.9  --   --   MCHC 31.9 32.7 33.2 33.2  --   --   RDW 15.9* 16.1* 16.2* 16.3*  --   --     Chemistries   Recent Labs Lab 03/07/16 1157 03/08/16 1423 03/09/16 0642 03/10/16 0347  NA 139 141 143 142  K 5.8* 4.9 5.5* 4.9  CL 110 113* 116* 117*  CO2 20* 21* 20* 20*  GLUCOSE 198* 144* 127* 143*  BUN 55* 47* 42* 42*  CREATININE 3.65* 2.57* 2.32* 2.12*  CALCIUM 8.8* 7.8* 7.8* 8.1*  MG  --  2.0  --   --   AST 25  --   --   --   ALT 18  --   --   --   ALKPHOS 59  --   --   --   BILITOT 0.4  --   --   --     Lab Results  Component Value Date   HGBA1C 6.3 (H) 05/30/2015   Coagulation profile  Recent Labs Lab 03/07/16 1818  INR 1.31   Cardiac Enzymes  Recent Labs Lab 03/07/16 2155  TROPONINI 0.18*   ------------------------------------------------------------------------------------------------------------------    Component Value Date/Time   BNP 719.0 (H) 05/31/2015 1304    Inpatient Medications  Scheduled Meds: . B-complex with vitamin C  1 tablet Oral Daily  . cefTRIAXone (ROCEPHIN)  IV  1 g Intravenous Q24H  . cholecalciferol  5,000 Units Oral Daily  . ferrous sulfate  325 mg Oral QPC breakfast  . folic acid  1 mg Oral Daily  . gabapentin  100 mg Oral QPM  . guaiFENesin  1,200 mg Oral BID  . megestrol  400 mg Oral QPM  . memantine  28 mg Oral QPM  . metoprolol tartrate  50 mg Oral BID  . multivitamin with minerals  1 tablet Oral Daily  . oxybutynin  5 mg Oral BID  . pantoprazole (PROTONIX) IV  40 mg Intravenous Q24H  . polyvinyl alcohol  1 drop Both Eyes BID  . senna  1-2 tablet Oral QHS  . sodium chloride  500 mL Intravenous Once  . sodium chloride flush  10-40 mL Intracatheter Q12H  . vitamin C  1,000 mg Oral Daily   Continuous Infusions: . sodium chloride 50 mL/hr at 03/10/16 0700   Micro Results Recent Results (from the past 240 hour(s))  Blood culture (routine x 2)      Status: None (Preliminary result)   Collection Time: 03/07/16  3:36 PM  Result Value Ref Range Status   Specimen Description BLOOD RIGHT ANTECUBITAL  Final   Special Requests BOTTLES DRAWN AEROBIC AND ANAEROBIC 5 CC  Final   Culture   Final    NO GROWTH 4 DAYS Performed at [facility]    Report Status PENDING  Incomplete  Blood culture (routine x 2)     Status: None (Preliminary result)   Collection Time: 03/07/16  3:36 PM  Result Value Ref Range Status   Specimen Description BLOOD LEFT ANTECUBITAL  Final   Special Requests BOTTLES DRAWN AEROBIC ONLY 6 CC  Final   Culture   Final    NO GROWTH 4 DAYS Performed at [facility]    Report Status PENDING  Incomplete  Urine culture     Status: None   Collection Time: 03/07/16  3:51 PM  Result Value Ref Range Status   Specimen Description URINE, CATHETERIZED  Final   Special Requests NONE  Final   Culture NO GROWTH Performed at [facility]   Final   Report Status 03/08/2016 FINAL  Final  MRSA PCR Screening     Status: None   Collection Time: 03/07/16  8:58 PM  Result Value Ref Range Status   MRSA by PCR NEGATIVE NEGATIVE Final    Comment:        The GeneXpert MRSA Assay (FDA approved for NASAL specimens only), is one component of a comprehensive MRSA colonization surveillance program. It is not intended to diagnose MRSA infection nor to guide or monitor treatment for MRSA infections.     Radiology Reports Dg Chest 2 View  Result Date: 03/07/2016 CLINICAL DATA:  Altered mental status, lethargy for the past 3 days. EXAM: CHEST  2 VIEW COMPARISON:  PA and lateral chest x-ray of January 04, 2016 FINDINGS: The lungs are adequately inflated. There is no focal infiltrate. There is no pleural effusion. The cardiac silhouette remains enlarged. The pulmonary vascularity is prominent centrally but there is no cephalization of the vascular pattern. There is calcification in the wall of the aortic arch. The  mediastinum is normal in width. There is no pleural effusion. The bony thorax exhibits no acute abnormality. There degenerative changes of both shoulders. IMPRESSION: Cardiomegaly without pulmonary edema.  No acute pneumonia. Thoracic aortic atherosclerosis. Electronically Signed   By: David  [place] M.D.   On: 03/07/2016 14:06   Ct Head Wo Contrast  Result Date: 03/07/2016 CLINICAL DATA:  Altered mental status, weakness and lethargy for 3 days. EXAM: CT HEAD WITHOUT CONTRAST TECHNIQUE: Contiguous axial images were obtained from the base of the skull through the vertex without intravenous contrast. COMPARISON:  05/30/2015 FINDINGS: Brain: Stable age related cerebral atrophy, ventriculomegaly and periventricular white matter disease. New area of low attenuation in the left occipital lobe consistent with an infarct. This does not appear acute and could be subacute or chronic. No extra-axial fluid collections. No intracranial hemorrhage. No mass lesions. Vascular: Stable extensive vascular calcifications but no aneurysm or hyperdense vessels. Skull: No skull fracture or bone lesion. Sinuses/Orbits: Extensive paranasal sinus disease with possible sinonasal polyposis. Evidence of prior sinus surgery. The globes are intact. Other: No scalp lesions. IMPRESSION: Left occipital infarct is new since the prior CT from February 2017 but does not appear acute. It is probably subacute or chronic. No intracranial hemorrhage or mass lesion. Chronic sinus disease with possible sinonasal polyposis. Electronically Signed   By: Rudie Meyer M.D.   On: 03/07/2016 14:35   Mr Brain Wo Contrast  Result Date: 03/09/2016 CLINICAL DATA:  82 y/o F; 1 week of increasing confusion and lethargy. EXAM: MRI HEAD WITHOUT CONTRAST TECHNIQUE: Multiplanar, multiecho pulse sequences of the brain and surrounding structures were obtained without intravenous contrast. COMPARISON:  05/03/2012 MRI of the head.  03/07/2016 CT of the head. FINDINGS:  Brain: No  diffusion signal abnormality. Patchy T2 FLAIR hyperintense white matter signal abnormality minute predominantly perivascular distribution compatible with advanced chronic microvascular ischemic changes. Chronic left occipital infarct. Multiple chronic small lacunar infarcts within the basal ganglia bilaterally, right pons, and bilateral cerebellar hemispheres. Extensive diffuse brain parenchymal volume loss. Punctate focus of susceptibility hypointensity and right posterior temporal lobe is compatible hemosiderin deposition from old microhemorrhage. Vascular: Normal flow voids. Skull and upper cervical spine: Normal marrow signal. Sinuses/Orbits: Extensive paranasal sinus mucosal thickening and opacification of right maxillary, sphenoid, ethmoid, and frontal sinuses with fluid levels. Partial opacification of mastoid tips bilaterally. Other: None. IMPRESSION: 1. No acute intracranial abnormality identified. 2. Advanced chronic microvascular ischemic changes, parenchymal volume loss, multiple chronic lacunar infarcts in the cerebellum, pons, and basal ganglia, as well as left occipital chronic infarct. Findings are progressed from 2014. 3. Severe paranasal sinus disease with fluid levels which may represent acute on chronic sinusitis in the appropriate clinical setting. Electronically Signed   By: Mitzi Hansen M.D.   On: 03/09/2016 19:08   Dg Chest Port 1 View  Result Date: 03/08/2016 CLINICAL DATA:  Central catheter placement EXAM: PORTABLE CHEST 1 VIEW COMPARISON:  March 07, 2016 FINDINGS: Central catheter tip is at the cavoatrial junction. No pneumothorax. There is atelectatic change in the left mid lung. Lungs elsewhere are clear. Stable cardiomegaly with pulmonary vascularity within normal limits. There is atherosclerotic calcification aorta. No adenopathy. No bone lesions. IMPRESSION: Central catheter tip at cavoatrial junction without pneumothorax. Stable cardiomegaly. Left  midlung atelectasis. No edema or consolidation. Electronically Signed   By: Bretta Bang III M.D.   On: 03/08/2016 13:12     ELGERGAWY, DAWOOD M.D on 03/11/2016 at 11:17 AM  Between 7am to 7pm - Pager - [phone]  After 7pm go to [email or web address] - password [facility]  [facility] -  Office  [phone]

## 2016-03-12 LAB — CBC
HCT: 28.3 % — ABNORMAL LOW (ref 36.0–46.0)
Hemoglobin: 9.2 g/dL — ABNORMAL LOW (ref 12.0–15.0)
MCH: 30.4 pg (ref 26.0–34.0)
MCHC: 32.5 g/dL (ref 30.0–36.0)
MCV: 93.4 fL (ref 78.0–100.0)
PLATELETS: 188 10*3/uL (ref 150–400)
RBC: 3.03 MIL/uL — ABNORMAL LOW (ref 3.87–5.11)
RDW: 18.4 % — AB (ref 11.5–15.5)
WBC: 9.1 10*3/uL (ref 4.0–10.5)

## 2016-03-12 LAB — CULTURE, BLOOD (ROUTINE X 2)
CULTURE: NO GROWTH
CULTURE: NO GROWTH

## 2016-03-12 LAB — BASIC METABOLIC PANEL
Anion gap: 4 — ABNORMAL LOW (ref 5–15)
BUN: 36 mg/dL — AB (ref 6–20)
CALCIUM: 8.2 mg/dL — AB (ref 8.9–10.3)
CO2: 18 mmol/L — ABNORMAL LOW (ref 22–32)
CREATININE: 1.86 mg/dL — AB (ref 0.44–1.00)
Chloride: 120 mmol/L — ABNORMAL HIGH (ref 101–111)
GFR calc non Af Amer: 23 mL/min — ABNORMAL LOW (ref >60)
GFR, EST AFRICAN AMERICAN: 27 mL/min — AB (ref >60)
Glucose, Bld: 110 mg/dL — ABNORMAL HIGH (ref 65–99)
Potassium: 4.9 mmol/L (ref 3.5–5.1)
SODIUM: 142 mmol/L (ref 135–145)

## 2016-03-12 MED ORDER — METOPROLOL TARTRATE 50 MG PO TABS
75.0000 mg | ORAL_TABLET | Freq: Two times a day (BID) | ORAL | Status: DC
  Administered 2016-03-12 – 2016-03-14 (×5): 75 mg via ORAL
  Filled 2016-03-12 (×3): qty 1
  Filled 2016-03-12: qty 3
  Filled 2016-03-12: qty 1

## 2016-03-12 MED ORDER — PANTOPRAZOLE SODIUM 40 MG PO TBEC
40.0000 mg | DELAYED_RELEASE_TABLET | Freq: Every day | ORAL | Status: DC
  Administered 2016-03-12 – 2016-03-20 (×8): 40 mg via ORAL
  Filled 2016-03-12 (×9): qty 1

## 2016-03-12 NOTE — Progress Notes (Signed)
Patient ID: Darlene Daniels, female   DOB: 05-Jun-1924, 82 y.o.   MRN: [id number]    Progress Note   Subjective   Sleeping soundly,caregiver at bedside Nurse reports formed brown stool this am HGB 9.2 Pt remains confused when awake   Objective   Vital signs in last 24 hours: Temp:  [97.7 F (36.5 C)-99.1 F (37.3 C)] 98.5 F (36.9 C) (11/19 0630) Pulse Rate:  [82-138] 127 (11/19 0800) Resp:  [15-27] 20 (11/19 0800) BP: (124-166)/(82-125) 166/117 (11/19 0800) SpO2:  [91 %-100 %] 91 % (11/19 0800) Last BM Date: 03/11/16 General:   Elderly AA female in NADsleeping Heart:  Regular rate and rhythm; no murmurs Lungs: Respirations even and unlabored, lungs CTA bilaterally Abdomen:  Soft, nontender and nondistended. Normal bowel sounds. Extremities:  Without edema. Neurologic: asleep , did not wake her  Intake/Output from previous day: 11/18 0701 - 11/19 0700 In: 1260 [P.O.:50; I.V.:1210] Out: -  Intake/Output this shift: Total I/O In: 100 [I.V.:100] Out: -   Lab Results:  Recent Labs  03/10/16 0347  03/11/16 0232 03/11/16 1200 03/12/16 0357  WBC 8.9  --   --   --  9.1  HGB 6.8*  < > 9.6* 9.8* 9.2*  HCT 20.5*  < > 28.8* 30.1* 28.3*  PLT 227  --   --   --  188  < > = values in this interval not displayed. BMET  Recent Labs  03/10/16 0347 03/12/16 0357  NA 142 142  K 4.9 4.9  CL 117* 120*  CO2 20* 18*  GLUCOSE 143* 110*  BUN 42* 36*  CREATININE 2.12* 1.86*  CALCIUM 8.1* 8.2*   LFT No results for input(s): PROT, ALBUMIN, AST, ALT, ALKPHOS, BILITOT, BILIDIR, IBILI in the last 72 hours. PT/INR No results for input(s): LABPROT, INR in the last 72 hours.  Studies/Results: No results found.     Assessment / Plan:    #1 82 yo female with anemia and drop in hgb while on Heparin infusion for rapid Afib No evidence for any further bleeding off Heparin HGB stable Suspect NSAID induced gastropathy as had been on Celebrex  Continue protonix 40 mg po daily,  convert to oral Leave off NSAIDS  Gi will sign off   Active Problems:   Rheumatoid arthritis (HCC)   Acute on chronic renal failure (HCC)   Dementia   UTI (urinary tract infection)   Acute encephalopathy   Atrial fibrillation (HCC)   CVA (cerebral vascular accident) (HCC)   Left ventricular systolic dysfunction   Acute post-hemorrhagic anemia   Heme positive stool   Lower GI bleed     LOS: 5 days   Amy Esterwood  03/12/2016, 9:05 AM

## 2016-03-12 NOTE — Progress Notes (Signed)
PROGRESS NOTE                                                                                                                                                                                                             Patient Demographics:    Darlene Daniels, is a 82 y.o. female, DOB - 06-12-1924, ATF:[id number]  Admit date - 03/07/2016   Admitting Physician Starleen Arms, MD  Outpatient Primary MD for the patient is Alva Garnet., MD  LOS - 5  Outpatient Specialists: none  Chief Complaint  Patient presents with  . Altered Mental Status       Brief Narrative  82 year old female with history of hypertension, recurrent syncope, tachybrady syndrome, chronic kidney disease stage III, rheumatoid arthritis, orthostatic hypertension, dementia, urinary incontinence , Presents to ED with encephalopathy, secondary to UTI, as well workup significant for new onset A. fib , and elevated troponins , Head CT showing subacute/chronic left occipital infarct (new from CT since February 2017) but MRI showing chronic infarct, Seen by cardiology for A. fib management, initially on heparin GTT which has been stopped secondary to GI bleed.Acquired stress PRBC transfusion, no recurrence of GI bleed after holding anticoagulation.   Subjective:   Patient Reports she is feeling better today, complains only of cough, denies any chest pain or shortness of breath, had bowel movement this a.m., no evidence of melena or bright blood.   Assessment  & Plan :   Atrial fibrillation with RVR (HCC) - New onset. - Initially on Cardizem drip, currently on by mouth metoprolol, increase gradually and slowly by cards for better heart rate control (given known history of bradycardia with syncope in the past ), is to 75 mg oral twice a day - CHA2DS2VASc of 6. Initially on heparin GTT,NOT  Anticoagulation candidate currently given recent anemia, GI  bleeding and recurrent falls. -2-D echo repeated with EF of 40-45% and diffuse hypokinesis (new compared to last echo 2 months back). Likely due to rapid A. Fib.  UTI -Treated with IV Rocephin, urine  cultures with no growth  Acute metabolic encephalopathy -Worsened mentation on admission. Patient with underlying dementia, and with encephalopathy due to  UTI and chronic stroke.  - MRI brain with chronic multiple lacunar infarcts - Improving  GI bleed - GI consult appreciated, this is most likely lower GI bleed (given  minimal amount of fresh blood with stool ) in the setting of anticoagulation, hemoglobin stable posttransfusion since holding heparin GTT .plan with conservative management . - 2 units PRBC transfusion 11/17  Acute on chronic kidney injury stage III -Suspect secondary to dehydration and UTI.  -Back to baseline on IV hydration  Hyperkalemia -Resolved  New Systolic CHF -Noted for low EF of 40-45%, Likely due to rapid A. fib. On beta blockers, not ACE I/ARB candidate given poor renal function. -has remained Euvolemic on exam. -will follow daily weights and strict intake and output   Elevated troponin -Chronic and in the setting of A. Fib with RVR.  -No chest pain symptoms or other acute EKG changes. -cardiology on board  Old  CVA (cerebral vascular accident) (HCC) -chronic  left occipital infarct seen on head CT.  -MRI brain with evidence of chronic multiple lacunar infarcts -continue risk factors modifications   Dementia -Continue Namenda.  Rheumatoid arthritis -was On Celebrex. Will continue holding given poor renal function  Anemia - Acute on chronic iron deficiency, worsened most likely due to GI bleed - Hold heparin GTT, last used units PRBC 11/17 given hemoglobin of 6.8,  Acute on chronic sinusitis - Patient on Rocephin,  changed to  11/18 to Augmentin   Essential hypertension  -continue hydralazine, on metoprolol  Poor IV access and limitation to  blood draw. -Discussed options for IV access with daughter and she prefers having a PICC line or midline for blood draws.  -PICC line in place now   Code Status : Full code   Family Communication  : Spoke with daughter via phone  11/19  Disposition Plan  : transfer to tele. .  Consults  :  Cardiology,GI  Procedures  : 2-D echo:   DVT Prophylaxis  : IV heparin stopped 11/17 , SCD  Lab Results  Component Value Date   PLT 188 03/12/2016    Antibiotics  :    Anti-infectives    Start     Dose/Rate Route Frequency Ordered Stop   03/11/16 1200  amoxicillin-clavulanate (AUGMENTIN) 875-125 MG per tablet 1 tablet     1 tablet Oral Every 12 hours 03/11/16 1125     03/08/16 1700  cefTRIAXone (ROCEPHIN) 1 g in dextrose 5 % 50 mL IVPB  Status:  Discontinued     1 g 100 mL/hr over 30 Minutes Intravenous Every 24 hours 03/07/16 2052 03/11/16 1125   03/07/16 1645  cefTRIAXone (ROCEPHIN) 1 g in dextrose 5 % 50 mL IVPB     1 g 100 mL/hr over 30 Minutes Intravenous  Once 03/07/16 1638 03/07/16 1741        Objective:   Vitals:   03/12/16 0500 03/12/16 0600 03/12/16 0630 03/12/16 0800  BP: (!) 150/117 (!) 158/125  (!) 166/117  Pulse: 100 (!) 102  (!) 127  Resp: 19 20  20   Temp:   98.5 F (36.9 C)   TempSrc:      SpO2: 93% 93%  91%  Weight:      Height:        Wt Readings from Last 3 Encounters:  03/07/16 74.3 kg (163 lb 12.8 oz)  01/04/16 77.6 kg (171 lb)  08/12/15 79.4 kg (175 lb)     Intake/Output Summary (Last 24 hours) at 03/12/16 1040 Last data filed at 03/12/16 1000  Gross per 24 hour  Intake             1550 ml  Output  0 ml  Net             1550 ml     Physical Exam  Gen: not in distress, More awake, alert and appropriate today  HEENT:  moist mucosa, supple neck, no JVD Chest: clear b/l, no use of accessory muscles CVS: S1 and S2 irregular, No murmurs rub or gallop GI: soft, NT, ND, BS+ Musculoskeletal: warm, no edema CNS:, moving four limbs  and able to follow simple commands     Data Review:    CBC  Recent Labs Lab 03/07/16 1157 03/08/16 1423 03/09/16 0600 03/10/16 0347 03/10/16 1758 03/11/16 0232 03/11/16 1200 03/12/16 0357  WBC 7.9 8.1 8.9 8.9  --   --   --  9.1  HGB 9.4* 8.2* 7.5* 6.8* 9.5* 9.6* 9.8* 9.2*  HCT 29.5* 25.1* 22.6* 20.5* 28.3* 28.8* 30.1* 28.3*  PLT 239 239 267 227  --   --   --  188  MCV 96.7 97.7 95.8 96.2  --   --   --  93.4  MCH 30.8 31.9 31.8 31.9  --   --   --  30.4  MCHC 31.9 32.7 33.2 33.2  --   --   --  32.5  RDW 15.9* 16.1* 16.2* 16.3*  --   --   --  18.4*    Chemistries   Recent Labs Lab 03/07/16 1157 03/08/16 1423 03/09/16 0642 03/10/16 0347 03/12/16 0357  NA 139 141 143 142 142  K 5.8* 4.9 5.5* 4.9 4.9  CL 110 113* 116* 117* 120*  CO2 20* 21* 20* 20* 18*  GLUCOSE 198* 144* 127* 143* 110*  BUN 55* 47* 42* 42* 36*  CREATININE 3.65* 2.57* 2.32* 2.12* 1.86*  CALCIUM 8.8* 7.8* 7.8* 8.1* 8.2*  MG  --  2.0  --   --   --   AST 25  --   --   --   --   ALT 18  --   --   --   --   ALKPHOS 59  --   --   --   --   BILITOT 0.4  --   --   --   --     Lab Results  Component Value Date   HGBA1C 6.3 (H) 05/30/2015   Coagulation profile  Recent Labs Lab 03/07/16 1818  INR 1.31   Cardiac Enzymes  Recent Labs Lab 03/07/16 2155  TROPONINI 0.18*   ------------------------------------------------------------------------------------------------------------------    Component Value Date/Time   BNP 719.0 (H) 05/31/2015 1304    Inpatient Medications  Scheduled Meds: . amoxicillin-clavulanate  1 tablet Oral Q12H  . B-complex with vitamin C  1 tablet Oral Daily  . cholecalciferol  5,000 Units Oral Daily  . ferrous sulfate  325 mg Oral QPC breakfast  . folic acid  1 mg Oral Daily  . gabapentin  100 mg Oral QPM  . guaiFENesin  15 mL Oral Q4H  . megestrol  400 mg Oral QPM  . memantine  28 mg Oral QPM  . metoprolol tartrate  75 mg Oral BID  . multivitamin with minerals   1 tablet Oral Daily  . oxybutynin  5 mg Oral BID  . pantoprazole (PROTONIX) IV  40 mg Intravenous Q24H  . polyvinyl alcohol  1 drop Both Eyes BID  . senna  1-2 tablet Oral QHS  . sodium chloride  500 mL Intravenous Once  . sodium chloride flush  10-40 mL Intracatheter Q12H  . vitamin C  1,000 mg Oral Daily   Continuous Infusions: . sodium chloride 50 mL/hr at 03/12/16 0600   Micro Results Recent Results (from the past 240 hour(s))  Blood culture (routine x 2)     Status: None (Preliminary result)   Collection Time: 03/07/16  3:36 PM  Result Value Ref Range Status   Specimen Description BLOOD RIGHT ANTECUBITAL  Final   Special Requests BOTTLES DRAWN AEROBIC AND ANAEROBIC 5 CC  Final   Culture   Final    NO GROWTH 4 DAYS Performed at [facility]    Report Status PENDING  Incomplete  Blood culture (routine x 2)     Status: None (Preliminary result)   Collection Time: 03/07/16  3:36 PM  Result Value Ref Range Status   Specimen Description BLOOD LEFT ANTECUBITAL  Final   Special Requests BOTTLES DRAWN AEROBIC ONLY 6 CC  Final   Culture   Final    NO GROWTH 4 DAYS Performed at [facility]    Report Status PENDING  Incomplete  Urine culture     Status: None   Collection Time: 03/07/16  3:51 PM  Result Value Ref Range Status   Specimen Description URINE, CATHETERIZED  Final   Special Requests NONE  Final   Culture NO GROWTH Performed at [facility]   Final   Report Status 03/08/2016 FINAL  Final  MRSA PCR Screening     Status: None   Collection Time: 03/07/16  8:58 PM  Result Value Ref Range Status   MRSA by PCR NEGATIVE NEGATIVE Final    Comment:        The GeneXpert MRSA Assay (FDA approved for NASAL specimens only), is one component of a comprehensive MRSA colonization surveillance program. It is not intended to diagnose MRSA infection nor to guide or monitor treatment for MRSA infections.     Radiology Reports Dg Chest 2 View  Result  Date: 03/07/2016 CLINICAL DATA:  Altered mental status, lethargy for the past 3 days. EXAM: CHEST  2 VIEW COMPARISON:  PA and lateral chest x-ray of January 04, 2016 FINDINGS: The lungs are adequately inflated. There is no focal infiltrate. There is no pleural effusion. The cardiac silhouette remains enlarged. The pulmonary vascularity is prominent centrally but there is no cephalization of the vascular pattern. There is calcification in the wall of the aortic arch. The mediastinum is normal in width. There is no pleural effusion. The bony thorax exhibits no acute abnormality. There degenerative changes of both shoulders. IMPRESSION: Cardiomegaly without pulmonary edema.  No acute pneumonia. Thoracic aortic atherosclerosis. Electronically Signed   By: David  [place] M.D.   On: 03/07/2016 14:06   Ct Head Wo Contrast  Result Date: 03/07/2016 CLINICAL DATA:  Altered mental status, weakness and lethargy for 3 days. EXAM: CT HEAD WITHOUT CONTRAST TECHNIQUE: Contiguous axial images were obtained from the base of the skull through the vertex without intravenous contrast. COMPARISON:  05/30/2015 FINDINGS: Brain: Stable age related cerebral atrophy, ventriculomegaly and periventricular white matter disease. New area of low attenuation in the left occipital lobe consistent with an infarct. This does not appear acute and could be subacute or chronic. No extra-axial fluid collections. No intracranial hemorrhage. No mass lesions. Vascular: Stable extensive vascular calcifications but no aneurysm or hyperdense vessels. Skull: No skull fracture or bone lesion. Sinuses/Orbits: Extensive paranasal sinus disease with possible sinonasal polyposis. Evidence of prior sinus surgery. The globes are intact. Other: No scalp lesions. IMPRESSION: Left occipital infarct is new since the  prior CT from February 2017 but does not appear acute. It is probably subacute or chronic. No intracranial hemorrhage or mass lesion. Chronic sinus  disease with possible sinonasal polyposis. Electronically Signed   By: Rudie Meyer M.D.   On: 03/07/2016 14:35   Mr Brain Wo Contrast  Result Date: 03/09/2016 CLINICAL DATA:  82 y/o F; 1 week of increasing confusion and lethargy. EXAM: MRI HEAD WITHOUT CONTRAST TECHNIQUE: Multiplanar, multiecho pulse sequences of the brain and surrounding structures were obtained without intravenous contrast. COMPARISON:  05/03/2012 MRI of the head.  03/07/2016 CT of the head. FINDINGS: Brain: No diffusion signal abnormality. Patchy T2 FLAIR hyperintense white matter signal abnormality minute predominantly perivascular distribution compatible with advanced chronic microvascular ischemic changes. Chronic left occipital infarct. Multiple chronic small lacunar infarcts within the basal ganglia bilaterally, right pons, and bilateral cerebellar hemispheres. Extensive diffuse brain parenchymal volume loss. Punctate focus of susceptibility hypointensity and right posterior temporal lobe is compatible hemosiderin deposition from old microhemorrhage. Vascular: Normal flow voids. Skull and upper cervical spine: Normal marrow signal. Sinuses/Orbits: Extensive paranasal sinus mucosal thickening and opacification of right maxillary, sphenoid, ethmoid, and frontal sinuses with fluid levels. Partial opacification of mastoid tips bilaterally. Other: None. IMPRESSION: 1. No acute intracranial abnormality identified. 2. Advanced chronic microvascular ischemic changes, parenchymal volume loss, multiple chronic lacunar infarcts in the cerebellum, pons, and basal ganglia, as well as left occipital chronic infarct. Findings are progressed from 2014. 3. Severe paranasal sinus disease with fluid levels which may represent acute on chronic sinusitis in the appropriate clinical setting. Electronically Signed   By: Mitzi Hansen M.D.   On: 03/09/2016 19:08   Dg Chest Port 1 View  Result Date: 03/08/2016 CLINICAL DATA:  Central catheter  placement EXAM: PORTABLE CHEST 1 VIEW COMPARISON:  March 07, 2016 FINDINGS: Central catheter tip is at the cavoatrial junction. No pneumothorax. There is atelectatic change in the left mid lung. Lungs elsewhere are clear. Stable cardiomegaly with pulmonary vascularity within normal limits. There is atherosclerotic calcification aorta. No adenopathy. No bone lesions. IMPRESSION: Central catheter tip at cavoatrial junction without pneumothorax. Stable cardiomegaly. Left midlung atelectasis. No edema or consolidation. Electronically Signed   By: Bretta Bang III M.D.   On: 03/08/2016 13:12     Thanh Mottern M.D on 03/12/2016 at 10:40 AM  Between 7am to 7pm - Pager - [phone]  After 7pm go to [email or web address] - password [facility]  [facility] -  Office  [phone]

## 2016-03-12 NOTE — Progress Notes (Signed)
PT Cancellation Note  Patient Details Name: Darlene Daniels MRN: [id number] DOB: March 24, 1925   Cancelled Treatment:    Reason Eval/Treat Not Completed: Medical issues which prohibited therapy (BP remains high. check back another time)   Rada Hay 03/12/2016, 8:01 AM Blanchard Kelch PT [phone]

## 2016-03-12 NOTE — Progress Notes (Signed)
Patient Name: Melita Villalona Date of Encounter: 03/12/2016  Primary Cardiologist: Dr. Alicia Amel Problem List     Active Problems:   Rheumatoid arthritis [facility])   Acute on chronic renal failure (HCC)   Dementia   UTI (urinary tract infection)   Acute encephalopathy   Atrial fibrillation (HCC)   CVA (cerebral vascular accident) (HCC)   Left ventricular systolic dysfunction   Acute post-hemorrhagic anemia   Heme positive stool   Lower GI bleed     Subjective   Mild SOB; denies CP; patient confused  Inpatient Medications    Scheduled Meds: . amoxicillin-clavulanate  1 tablet Oral Q12H  . B-complex with vitamin C  1 tablet Oral Daily  . cholecalciferol  5,000 Units Oral Daily  . ferrous sulfate  325 mg Oral QPC breakfast  . folic acid  1 mg Oral Daily  . gabapentin  100 mg Oral QPM  . guaiFENesin  15 mL Oral Q4H  . megestrol  400 mg Oral QPM  . memantine  28 mg Oral QPM  . metoprolol tartrate  50 mg Oral BID  . multivitamin with minerals  1 tablet Oral Daily  . oxybutynin  5 mg Oral BID  . pantoprazole (PROTONIX) IV  40 mg Intravenous Q24H  . polyvinyl alcohol  1 drop Both Eyes BID  . senna  1-2 tablet Oral QHS  . sodium chloride  500 mL Intravenous Once  . sodium chloride flush  10-40 mL Intracatheter Q12H  . vitamin C  1,000 mg Oral Daily   Continuous Infusions: . sodium chloride 50 mL/hr at 03/12/16 0600   PRN Meds: acetaminophen **OR** acetaminophen, sodium chloride flush   Vital Signs    Vitals:   03/12/16 0400 03/12/16 0500 03/12/16 0600 03/12/16 0630  BP: (!) 152/101 (!) 150/117 (!) 158/125   Pulse: (!) 138 100 (!) 102   Resp: (!) 21 19 20    Temp: 99.1 F (37.3 C)   98.5 F (36.9 C)  TempSrc: Oral     SpO2: 95% 93% 93%   Weight:      Height:        Intake/Output Summary (Last 24 hours) at 03/12/16 0752 Last data filed at 03/12/16 0600  Gross per 24 hour  Intake             1260 ml  Output                0 ml  Net             1260 ml    Filed Weights   03/07/16 2050  Weight: 163 lb 12.8 oz (74.3 kg)    Physical Exam   GEN: Well nourished,  in no acute distress.  HEENT: normal Neck: Supple Cardiac: irreg, tachycardic  Respiratory:  CTA anteriorly GI: Abd -Soft, nontender, nondistended Skin: warm and dry Neuro:  confused Ext: trace edema   Labs    CBC  Recent Labs  03/10/16 0347  03/11/16 1200 03/12/16 0357  WBC 8.9  --   --  9.1  HGB 6.8*  < > 9.8* 9.2*  HCT 20.5*  < > 30.1* 28.3*  MCV 96.2  --   --  93.4  PLT 227  --   --  188  < > = values in this interval not displayed. Basic Metabolic Panel  Recent Labs  03/10/16 0347 03/12/16 0357  NA 142 142  K 4.9 4.9  CL 117* 120*  CO2 20* 18*  GLUCOSE  143* 110*  BUN 42* 36*  CREATININE 2.12* 1.86*  CALCIUM 8.1* 8.2*    Telemetry    A fib with RVR rates 120-130 with either aberrancy vs. PVCs - Personally Reviewed     Cardiac Studies   ECHO 03/08/16  Study Conclusions  - Left ventricle: The cavity size was normal. Systolic function was mildly to moderately reduced. The estimated ejection fraction was in the range of 40% to 45%. Diffuse hypokinesis. - Aortic valve: Trileaflet; mild thickening and moderate calcification of the noncoronary cusp. Transvalvular velocity was within the normal range. There was no stenosis. There was moderate regurgitation. Regurgitation pressure half-time: 431 ms. - Mitral valve: Calcified annulus. Transvalvular velocity was within the normal range. There was no evidence for stenosis. There was mild regurgitation. - Left atrium: The atrium was severely dilated. - Right ventricle: The cavity size was normal. Wall thickness was normal. Systolic function was mildly reduced. - Right atrium: The atrium was severely dilated. - Atrial septum: No defect or patent foramen ovale was identified by color flow Doppler. - Tricuspid valve: There was mild regurgitation. - Pulmonary arteries:  Systolic pressure was within the normal range. PA peak pressure: 59 mm Hg (S).  Impressions:  - Reduced systolic function may be due to atrial fibrillation with rapid ventricular response. Consider repeating echo when rate has improved for a more accurate assessment of LVEF.    Patient Profile     82 year old female with history of hypertension, recurrent syncope, tachycardia bradycardia syndrome (PSVT), chronic kidney disease stage III, rheumatoid arthritis, orthostatic hypertension, dementia, urinary incontinence was brought to the ED by her daughter with increased confusion and lethargy for past 1 week, associated with poor appetite.- found to be in A fib with RVR. Also with GI bleed.   Assessment & Plan    1. A fib with RVR -Heart rate remains elevated. Increase metoprolol to 75 mg twice a day. Follow on telemetry closely given history of tachybradycardia syndrome. We cannot anticoagulate given recent anemia and bleeding.  2. Hx of tachy brady syndrome with hx of PSVT- follow closely on telemetry.  3. Elevated troponin -no clear trend and not consistent with ACS; pt not a candidate for aggressive cardiac eval.  4. AMS may be due to UTI and her chronic dementia. Per IM  5. UTI per Im  6. Acute renal failure --per IM    7. HTN -elevated BP; change metoprolol to 50 BID and follow.  8. Dementia   9. GI bleed  Signed, Olga Millers, MD  03/12/2016, 7:52 AM

## 2016-03-12 NOTE — Progress Notes (Signed)
Key Points: Use following P&T approved IV to PO non-antibiotic change policy.  Description contains the criteria that are approved Note: Policy Excludes:  Esophagectomy patientsPHARMACIST - PHYSICIAN COMMUNICATION DR:   [facility] CONCERNING: IV to Oral Route Change Policy  RECOMMENDATION: This patient is receiving protonix by the intravenous route.  Based on criteria approved by the Pharmacy and Therapeutics Committee, the intravenous medication(s) is/are being converted to the equivalent oral dose form(s).   DESCRIPTION: These criteria include:  The patient is eating (either orally or via tube) and/or has been taking other orally administered medications for a least 24 hours  The patient has no evidence of active gastrointestinal bleeding or impaired GI absorption (gastrectomy, short bowel, patient on TNA or NPO).  If you have questions about this conversion, please contact the Pharmacy Department  []   [phone] )  [phone] []   [phone] )   []   [phone] )  [facility] [x]   [phone] )  [facility]  [facility] [place], [facility] 03/12/2016 11:16 AM

## 2016-03-13 DIAGNOSIS — I519 Heart disease, unspecified: Secondary | ICD-10-CM

## 2016-03-13 DIAGNOSIS — I4891 Unspecified atrial fibrillation: Principal | ICD-10-CM

## 2016-03-13 DIAGNOSIS — G9340 Encephalopathy, unspecified: Secondary | ICD-10-CM

## 2016-03-13 LAB — BASIC METABOLIC PANEL
Anion gap: 6 (ref 5–15)
BUN: 27 mg/dL — AB (ref 6–20)
CALCIUM: 8.3 mg/dL — AB (ref 8.9–10.3)
CO2: 18 mmol/L — ABNORMAL LOW (ref 22–32)
CREATININE: 1.84 mg/dL — AB (ref 0.44–1.00)
Chloride: 120 mmol/L — ABNORMAL HIGH (ref 101–111)
GFR calc Af Amer: 27 mL/min — ABNORMAL LOW (ref >60)
GFR, EST NON AFRICAN AMERICAN: 23 mL/min — AB (ref >60)
GLUCOSE: 98 mg/dL (ref 65–99)
Potassium: 4.7 mmol/L (ref 3.5–5.1)
SODIUM: 144 mmol/L (ref 135–145)

## 2016-03-13 LAB — CBC
HCT: 28.5 % — ABNORMAL LOW (ref 36.0–46.0)
Hemoglobin: 9.2 g/dL — ABNORMAL LOW (ref 12.0–15.0)
MCH: 30.8 pg (ref 26.0–34.0)
MCHC: 32.3 g/dL (ref 30.0–36.0)
MCV: 95.3 fL (ref 78.0–100.0)
PLATELETS: 193 10*3/uL (ref 150–400)
RBC: 2.99 MIL/uL — AB (ref 3.87–5.11)
RDW: 18.5 % — AB (ref 11.5–15.5)
WBC: 8.9 10*3/uL (ref 4.0–10.5)

## 2016-03-13 MED ORDER — AMOXICILLIN-POT CLAVULANATE 500-125 MG PO TABS
1.0000 | ORAL_TABLET | Freq: Two times a day (BID) | ORAL | Status: DC
  Administered 2016-03-13 – 2016-03-16 (×8): 500 mg via ORAL
  Filled 2016-03-13 (×9): qty 1

## 2016-03-13 MED ORDER — ENSURE ENLIVE PO LIQD
237.0000 mL | Freq: Two times a day (BID) | ORAL | Status: DC
Start: 2016-03-13 — End: 2016-03-20
  Administered 2016-03-14 – 2016-03-20 (×10): 237 mL via ORAL

## 2016-03-13 NOTE — Progress Notes (Signed)
PROGRESS NOTE                                                                                                                                                                                                             Patient Demographics:    Darlene Daniels, is a 82 y.o. female, DOB - 1925-03-22, MBT:[id number]  Admit date - 03/07/2016   Admitting Physician Starleen Arms, MD  Outpatient Primary MD for the patient is Alva Garnet., MD  LOS - 6  Outpatient Specialists: none  Chief Complaint  Patient presents with  . Altered Mental Status       Brief Narrative  82 year old female with history of hypertension, recurrent syncope, tachybrady syndrome, chronic kidney disease stage III, rheumatoid arthritis, orthostatic hypertension, dementia, urinary incontinence , Presents to ED with encephalopathy, secondary to UTI, as well workup significant for new onset A. fib , and elevated troponins , Head CT showing subacute/chronic left occipital infarct (new from CT since February 2017) but MRI showing chronic infarct, Seen by cardiology for A. fib management, initially on heparin GTT which has been stopped secondary to GI bleed.required  PRBC transfusion, no recurrence of GI bleed after holding anticoagulation.   Subjective:   Patient Reports she is feeling better today, denies any chest pain or shortness of breath,Denies cough.   Assessment  & Plan :   Atrial fibrillation with RVR (HCC) - New onset. - Initially on Cardizem drip, currently on by mouth metoprolol, increase gradually and slowly by cards for better heart rate control (given known history of bradycardia with syncope in the past ), Heart rate remains uncontrolled on metoprolol 75 mg twice a day. - CHA2DS2VASc of 6. Initially on heparin GTT,NOT  Anticoagulation candidate currently given recent anemia, GI bleeding and recurrent falls. -2-D echo repeated with EF  of 40-45% and diffuse hypokinesis (new compared to last echo 2 months back). Likely due to rapid A. Fib.  UTI -Treated with IV Rocephin, urine  cultures with no growth  Acute metabolic encephalopathy -Worsened mentation on admission. Patient with underlying dementia, and with encephalopathy due to  UTI and chronic stroke.  - MRI brain with chronic multiple lacunar infarcts - Improving  GI bleed - GI consult appreciated, this is most likely lower GI bleed (given minimal amount of fresh blood with stool ) in the setting of  anticoagulation, hemoglobin stable posttransfusion since holding heparin GTT .plan with conservative management . - 2 units PRBC transfusion 11/17  Acute on chronic kidney injury stage III -Suspect secondary to dehydration and UTI.  -Back to baseline on IV hydration  Hyperkalemia -Resolved  New Systolic CHF -Noted for low EF of 40-45%, Likely due to rapid A. fib. On beta blockers, not ACE I/ARB candidate given poor renal function. -has remained Euvolemic on exam. -will follow daily weights and strict intake and output   Elevated troponin -Chronic and in the setting of A. Fib with RVR.  -No chest pain symptoms or other acute EKG changes. -cardiology on board  Old  CVA (cerebral vascular accident) (HCC) -chronic  left occipital infarct seen on head CT.  -MRI brain with evidence of chronic multiple lacunar infarcts -continue risk factors modifications   Dementia -Continue Namenda.  Rheumatoid arthritis -was On Celebrex. Will continue holding given poor renal function  Anemia - Acute on chronic iron deficiency, worsened most likely due to GI bleed - Hold heparin GTT, last used units PRBC 11/17 given hemoglobin of 6.8,  Acute on chronic sinusitis - Patient on Rocephin,  changed to  11/18 to Augmentin   Essential hypertension  -continue hydralazine, on metoprolol  Poor IV access and limitation to blood draw. -Discussed options for IV access with  daughter and she prefers having a PICC line or midline for blood draws.  -PICC line in place now   Code Status : Full code   Family Communication  :  daughter at bedside  Disposition Plan  : Home with home care when HR controlled .  Consults  :  Cardiology,GI  Procedures  : 2-D echo:   DVT Prophylaxis  : IV heparin stopped 11/17 , SCD  Lab Results  Component Value Date   PLT 193 03/13/2016    Antibiotics  :    Anti-infectives    Start     Dose/Rate Route Frequency Ordered Stop   03/13/16 1000  amoxicillin-clavulanate (AUGMENTIN) 500-125 MG per tablet 500 mg     1 tablet Oral Every 12 hours 03/13/16 0718     03/11/16 1200  amoxicillin-clavulanate (AUGMENTIN) 875-125 MG per tablet 1 tablet  Status:  Discontinued     1 tablet Oral Every 12 hours 03/11/16 1125 03/13/16 0717   03/08/16 1700  cefTRIAXone (ROCEPHIN) 1 g in dextrose 5 % 50 mL IVPB  Status:  Discontinued     1 g 100 mL/hr over 30 Minutes Intravenous Every 24 hours 03/07/16 2052 03/11/16 1125   03/07/16 1645  cefTRIAXone (ROCEPHIN) 1 g in dextrose 5 % 50 mL IVPB     1 g 100 mL/hr over 30 Minutes Intravenous  Once 03/07/16 1638 03/07/16 1741        Objective:   Vitals:   03/12/16 2004 03/13/16 0527 03/13/16 0706 03/13/16 1005  BP: 113/87 (!) 129/111 (!) 149/97   Pulse: (!) 112 (!) 132  (!) 106  Resp: 20 20    Temp: 98.9 F (37.2 C) 98.5 F (36.9 C)    TempSrc: Oral Axillary    SpO2: 94% 96%    Weight:      Height:        Wt Readings from Last 3 Encounters:  03/07/16 74.3 kg (163 lb 12.8 oz)  01/04/16 77.6 kg (171 lb)  08/12/15 79.4 kg (175 lb)     Intake/Output Summary (Last 24 hours) at 03/13/16 1202 Last data filed at 03/13/16 1000  Gross per 24 hour  Intake          3006.34 ml  Output                0 ml  Net          3006.34 ml     Physical Exam  Gen: not in distress, More awake, alert and appropriate today  HEENT:  moist mucosa, supple neck, no JVD Chest: clear b/l, no use of  accessory muscles CVS: S1 and S2 irregular, No murmurs rub or gallop GI: soft, NT, ND, BS+ Musculoskeletal: warm, no edema CNS:, moving four limbs and able to follow simple commands     Data Review:    CBC  Recent Labs Lab 03/08/16 1423 03/09/16 0600 03/10/16 0347 03/10/16 1758 03/11/16 0232 03/11/16 1200 03/12/16 0357 03/13/16 0515  WBC 8.1 8.9 8.9  --   --   --  9.1 8.9  HGB 8.2* 7.5* 6.8* 9.5* 9.6* 9.8* 9.2* 9.2*  HCT 25.1* 22.6* 20.5* 28.3* 28.8* 30.1* 28.3* 28.5*  PLT 239 267 227  --   --   --  188 193  MCV 97.7 95.8 96.2  --   --   --  93.4 95.3  MCH 31.9 31.8 31.9  --   --   --  30.4 30.8  MCHC 32.7 33.2 33.2  --   --   --  32.5 32.3  RDW 16.1* 16.2* 16.3*  --   --   --  18.4* 18.5*    Chemistries   Recent Labs Lab 03/07/16 1157 03/08/16 1423 03/09/16 0642 03/10/16 0347 03/12/16 0357 03/13/16 0515  NA 139 141 143 142 142 144  K 5.8* 4.9 5.5* 4.9 4.9 4.7  CL 110 113* 116* 117* 120* 120*  CO2 20* 21* 20* 20* 18* 18*  GLUCOSE 198* 144* 127* 143* 110* 98  BUN 55* 47* 42* 42* 36* 27*  CREATININE 3.65* 2.57* 2.32* 2.12* 1.86* 1.84*  CALCIUM 8.8* 7.8* 7.8* 8.1* 8.2* 8.3*  MG  --  2.0  --   --   --   --   AST 25  --   --   --   --   --   ALT 18  --   --   --   --   --   ALKPHOS 59  --   --   --   --   --   BILITOT 0.4  --   --   --   --   --     Lab Results  Component Value Date   HGBA1C 6.3 (H) 05/30/2015   Coagulation profile  Recent Labs Lab 03/07/16 1818  INR 1.31   Cardiac Enzymes  Recent Labs Lab 03/07/16 2155  TROPONINI 0.18*   ------------------------------------------------------------------------------------------------------------------    Component Value Date/Time   BNP 719.0 (H) 05/31/2015 1304    Inpatient Medications  Scheduled Meds: . amoxicillin-clavulanate  1 tablet Oral Q12H  . B-complex with vitamin C  1 tablet Oral Daily  . cholecalciferol  5,000 Units Oral Daily  . ferrous sulfate  325 mg Oral QPC breakfast    . folic acid  1 mg Oral Daily  . gabapentin  100 mg Oral QPM  . guaiFENesin  15 mL Oral Q4H  . megestrol  400 mg Oral QPM  . memantine  28 mg Oral QPM  . metoprolol tartrate  75 mg Oral BID  . multivitamin with minerals  1 tablet Oral Daily  . oxybutynin  5 mg Oral BID  .  pantoprazole  40 mg Oral Daily  . polyvinyl alcohol  1 drop Both Eyes BID  . senna  1-2 tablet Oral QHS  . sodium chloride  500 mL Intravenous Once  . sodium chloride flush  10-40 mL Intracatheter Q12H  . vitamin C  1,000 mg Oral Daily   Continuous Infusions: . sodium chloride 50 mL/hr at 03/13/16 0300   Micro Results Recent Results (from the past 240 hour(s))  Blood culture (routine x 2)     Status: None   Collection Time: 03/07/16  3:36 PM  Result Value Ref Range Status   Specimen Description BLOOD RIGHT ANTECUBITAL  Final   Special Requests BOTTLES DRAWN AEROBIC AND ANAEROBIC 5 CC  Final   Culture   Final    NO GROWTH 5 DAYS [facility]    Report Status 03/12/2016 FINAL  Final  Blood culture (routine x 2)     Status: None   Collection Time: 03/07/16  3:36 PM  Result Value Ref Range Status   Specimen Description BLOOD LEFT ANTECUBITAL  Final   Special Requests BOTTLES DRAWN AEROBIC ONLY 6 CC  Final   Culture   Final    NO GROWTH 5 DAYS [facility]    Report Status 03/12/2016 FINAL  Final  Urine culture     Status: None   Collection Time: 03/07/16  3:51 PM  Result Value Ref Range Status   Specimen Description URINE, CATHETERIZED  Final   Special Requests NONE  Final   Culture NO GROWTH [facility]formed [facility]   Final   Report Status 03/08/2016 FINAL  Final  MRSA PCR Screening     Status: None   Collection Time: 03/07/16  8:58 PM  Result Value Ref Range Status   MRSA by PCR NEGATIVE NEGATIVE Final    Comment:        The GeneXpert MRSA Assay (FDA approved for NASAL specimens only), is one component of a comprehensive MRSA  colonization surveillance program. It is not intended to diagnose MRSA infection nor to guide or monitor treatment for MRSA infections.     Radiology Reports Dg Chest 2 View  Result Date: 03/07/2016 CLINICAL DATA:  Altered mental status, lethargy for the past 3 days. EXAM: CHEST  2 VIEW COMPARISON:  PA and lateral chest x-ray of January 04, 2016 FINDINGS: The lungs are adequately inflated. There is no focal infiltrate. There is no pleural effusion. The cardiac silhouette remains enlarged. The pulmonary vascularity is prominent centrally but there is no cephalization of the vascular pattern. There is calcification in the wall of the aortic arch. The mediastinum is normal in width. There is no pleural effusion. The bony thorax exhibits no acute abnormality. There degenerative changes of both shoulders. IMPRESSION: Cardiomegaly without pulmonary edema.  No acute pneumonia. Thoracic aortic atherosclerosis. Electronically Signed   By: David  SwazilandJordan M.D.   On: 03/07/2016 14:06   Ct Head Wo Contrast  Result Date: 03/07/2016 CLINICAL DATA:  Altered mental status, weakness and lethargy for 3 days. EXAM: CT HEAD WITHOUT CONTRAST TECHNIQUE: Contiguous axial images were obtained from the base of the skull through the vertex without intravenous contrast. COMPARISON:  05/30/2015 FINDINGS: Brain: Stable age related cerebral atrophy, ventriculomegaly and periventricular white matter disease. New area of low attenuation in the left occipital lobe consistent with an infarct. This does not appear acute and could be subacute or chronic. No extra-axial fluid collections. No intracranial hemorrhage. No mass lesions. Vascular: Stable  extensive vascular calcifications but no aneurysm or hyperdense vessels. Skull: No skull fracture or bone lesion. Sinuses/Orbits: Extensive paranasal sinus disease with possible sinonasal polyposis. Evidence of prior sinus surgery. The globes are intact. Other: No scalp lesions.  IMPRESSION: Left occipital infarct is new since the prior CT from February 2017 but does not appear acute. It is probably subacute or chronic. No intracranial hemorrhage or mass lesion. Chronic sinus disease with possible sinonasal polyposis. Electronically Signed   By: Rudie Meyer M.D.   On: 03/07/2016 14:35   Mr Brain Wo Contrast  Result Date: 03/09/2016 CLINICAL DATA:  82 y/o F; 1 week of increasing confusion and lethargy. EXAM: MRI HEAD WITHOUT CONTRAST TECHNIQUE: Multiplanar, multiecho pulse sequences of the brain and surrounding structures were obtained without intravenous contrast. COMPARISON:  05/03/2012 MRI of the head.  03/07/2016 CT of the head. FINDINGS: Brain: No diffusion signal abnormality. Patchy T2 FLAIR hyperintense white matter signal abnormality minute predominantly perivascular distribution compatible with advanced chronic microvascular ischemic changes. Chronic left occipital infarct. Multiple chronic small lacunar infarcts within the basal ganglia bilaterally, right pons, and bilateral cerebellar hemispheres. Extensive diffuse brain parenchymal volume loss. Punctate focus of susceptibility hypointensity and right posterior temporal lobe is compatible hemosiderin deposition from old microhemorrhage. Vascular: Normal flow voids. Skull and upper cervical spine: Normal marrow signal. Sinuses/Orbits: Extensive paranasal sinus mucosal thickening and opacification of right maxillary, sphenoid, ethmoid, and frontal sinuses with fluid levels. Partial opacification of mastoid tips bilaterally. Other: None. IMPRESSION: 1. No acute intracranial abnormality identified. 2. Advanced chronic microvascular ischemic changes, parenchymal volume loss, multiple chronic lacunar infarcts in the cerebellum, pons, and basal ganglia, as well as left occipital chronic infarct. Findings are progressed from 2014. 3. Severe paranasal sinus disease with fluid levels which may represent acute on chronic sinusitis in  the appropriate clinical setting. Electronically Signed   By: Mitzi Hansen M.D.   On: 03/09/2016 19:08   Dg Chest Port 1 View  Result Date: 03/08/2016 CLINICAL DATA:  Central catheter placement EXAM: PORTABLE CHEST 1 VIEW COMPARISON:  March 07, 2016 FINDINGS: Central catheter tip is at the cavoatrial junction. No pneumothorax. There is atelectatic change in the left mid lung. Lungs elsewhere are clear. Stable cardiomegaly with pulmonary vascularity within normal limits. There is atherosclerotic calcification aorta. No adenopathy. No bone lesions. IMPRESSION: Central catheter tip at cavoatrial junction without pneumothorax. Stable cardiomegaly. Left midlung atelectasis. No edema or consolidation. Electronically Signed   By: Bretta Bang III M.D.   On: 03/08/2016 13:12     Lisette Mancebo M.D on 03/13/2016 at 12:02 PM  Between 7am to 7pm - Pager - [phone]  After 7pm go to [email or web address] - password [facility]  [facility] -  Office  [phone]

## 2016-03-13 NOTE — Progress Notes (Signed)
Initial Nutrition Assessment  DOCUMENTATION CODES:   Not applicable  INTERVENTION:  Provide Ensure Enlive po BID, each supplement provides 350 kcal and 20 grams of protein.  Continue multivitamin with minerals as ordered.  Patient will need assistance eating meals if family members are not present.  Encouraged family to continue providing adequate calories and protein at meals.  NUTRITION DIAGNOSIS:   Swallowing difficulty related to dysphagia, chronic illness (dementia) as evidenced by per patient/family report.  GOAL:   Patient will meet greater than or equal to 90% of their needs  MONITOR:   PO intake, Supplement acceptance, Labs, Weight trends, I & O's, Skin  REASON FOR ASSESSMENT:   Low Braden    ASSESSMENT:   82 year old female with history of hypertension, recurrent syncope, tachybrady syndrome, chronic kidney disease stage III, rheumatoid arthritis, orthostatic hypertension, dementia, urinary incontinence , Presents to ED with encephalopathy, secondary to UTI, as well workup significant for new onset A. fib , and elevated troponins , Head CT showing subacute/chronic left occipital infarct (new from CT since February 2017) but MRI showing chronic infarct.    -Per SLP evaluation, patient made Dysphagia 1 diet with thin liquids. Patient was then put on CLD on 11/17 and advanced to FLD on 11/18. Now advanced back to D1 w/ Thin Liquids.  Spoke with patient's caregiver and daughter at bedside. Reports patient's appetite has been good with Megace. They report directly PTA her intake had been decreased for 1-2 days but it seems to be improving now. Report patient typically eats 3 meals daily of pureed diet and drinks Ensure. Denies N/V, abdominal pain, or constipation/diarrhea. Reports patient has been on a pureed diet for about 1 month due to difficulty swallowing and pocketing food in mouth. She did previously have a wound that is healed now.  UBW 172 lbs per daughter and  reports patient has likely lost weight. Per chart patient lost 8 lbs (5% body weight) over 2 months, which is not significant for time frame.   Medications reviewed and include: B-complex with vitamin C 1 tablet daily, Vitamin D 5000 units daily, ferrous sulfate 325 mg daily, folic acid 1 mg daily, Megace 400 mg daily, multivitamin with minerals daily, pantoprazole, senna, vitamin C 1000 micrograms daily, NS @ 50 ml/hr.  Labs reviewed: Chloride 120, CO2 18, BUN 27, Creatinine 1.84, Glucose 110.  Nutrition-Focused physical exam completed. Findings are no fat depletion, no muscle depletion, and mild edema.   Discussed with RN.   Diet Order:  DIET - DYS 1 Room service appropriate? Yes; Fluid consistency: Thin  Skin:  Reviewed, no issues (MSAD to perineum)  Last BM:  03/12/2016  Height:   Ht Readings from Last 1 Encounters:  03/07/16 5\' 9"  (1.753 m)    Weight:   Wt Readings from Last 1 Encounters:  03/07/16 163 lb 12.8 oz (74.3 kg)    Ideal Body Weight:  65.9 kg  BMI:  Body mass index is 24.19 kg/m.  Estimated Nutritional Needs:   Kcal:  1485-1735 (MSJ x 1.2-1.4)  Protein:  75-90 grams (1-1.2 grams/kg)  Fluid:  >/= 1.8 L/day (25 ml/kg)  EDUCATION NEEDS:   No education needs identified at this time  03/09/16, MS, RD, LDN Pager: [phone] After Hours Pager: [phone]

## 2016-03-13 NOTE — Progress Notes (Addendum)
Patient Name: Darlene Daniels Date of Encounter: 03/13/2016  Primary Cardiologist: Dr. Alicia Amel Problem List     Principal Problem:   Acute encephalopathy Active Problems:   Rheumatoid arthritis (HCC)   Acute on chronic renal failure (HCC)   Dementia   UTI (urinary tract infection)   Atrial fibrillation with RVR (HCC)   CVA (cerebral vascular accident) (HCC)   Left ventricular systolic dysfunction   Acute post-hemorrhagic anemia   Heme positive stool   Lower GI bleed    Subjective   Patient denies any acute complaints. Asking about taking care of the baby.  HR remains 120s-130s. Daughter at bedside, is concerned about increase in metoprolol given patient's history of tachy-brady and admission for syncope in 12/2015.   Daughter wonders if the AF is paroxysmal since the patient has had intermittently elevated HR in the past by their logs in the 120s-130s.  Inpatient Medications    . amoxicillin-clavulanate  1 tablet Oral Q12H  . B-complex with vitamin C  1 tablet Oral Daily  . cholecalciferol  5,000 Units Oral Daily  . ferrous sulfate  325 mg Oral QPC breakfast  . folic acid  1 mg Oral Daily  . gabapentin  100 mg Oral QPM  . guaiFENesin  15 mL Oral Q4H  . megestrol  400 mg Oral QPM  . memantine  28 mg Oral QPM  . metoprolol tartrate  75 mg Oral BID  . multivitamin with minerals  1 tablet Oral Daily  . oxybutynin  5 mg Oral BID  . pantoprazole  40 mg Oral Daily  . polyvinyl alcohol  1 drop Both Eyes BID  . senna  1-2 tablet Oral QHS  . sodium chloride  500 mL Intravenous Once  . sodium chloride flush  10-40 mL Intracatheter Q12H  . vitamin C  1,000 mg Oral Daily    Vital Signs    Vitals:   03/12/16 1137 03/12/16 2004 03/13/16 0527 03/13/16 0706  BP: (!) 132/97 113/87 (!) 129/111 (!) 149/97  Pulse: (!) 116 (!) 112 (!) 132   Resp: 20 20 20    Temp: 98.5 F (36.9 C) 98.9 F (37.2 C) 98.5 F (36.9 C)   TempSrc: Oral Oral Axillary   SpO2: 97% 94% 96%     Weight:      Height:        Intake/Output Summary (Last 24 hours) at 03/13/16 0934 Last data filed at 03/13/16 0650  Gross per 24 hour  Intake          3063.34 ml  Output                0 ml  Net          3063.34 ml   Filed Weights   03/07/16 2050  Weight: 163 lb 12.8 oz (74.3 kg)    Physical Exam    General: Well developed, well nourished frail elderly AAF, in no acute distress. HEENT: Normocephalic, atraumatic, sclera non-icteric, no xanthomas, nares are without discharge. Neck: Negative for carotid bruits. JVP not elevated. Lungs: Clear bilaterally to auscultation without wheezes, rales, or rhonchi. Breathing is unlabored. Cardiac: Rapid, irregular, S1 S2 without murmurs, rubs, or gallops.  Abdomen: Soft, non-tender, non-distended with normoactive bowel sounds. No rebound/guarding. Extremities: No clubbing or cyanosis. No edema. Distal pedal pulses are 2+ and equal bilaterally. Skin: Warm and dry, no significant rash. Neuro: Confused, asking about taking care of the baby, does follow commands Psych:  Pleasant affect  Labs  CBC  Recent Labs  03/12/16 0357 03/13/16 0515  WBC 9.1 8.9  HGB 9.2* 9.2*  HCT 28.3* 28.5*  MCV 93.4 95.3  PLT 188 193   Basic Metabolic Panel  Recent Labs  03/12/16 0357 03/13/16 0515  NA 142 144  K 4.9 4.7  CL 120* 120*  CO2 18* 18*  GLUCOSE 110* 98  BUN 36* 27*  CREATININE 1.86* 1.84*  CALCIUM 8.2* 8.3*    Telemetry    Atrial fib rates 1teens-130s. No sig bradycardia noted  Radiology    Dg Chest 2 View  Result Date: 03/07/2016 CLINICAL DATA:  Altered mental status, lethargy for the past 3 days. EXAM: CHEST  2 VIEW COMPARISON:  PA and lateral chest x-ray of January 04, 2016 FINDINGS: The lungs are adequately inflated. There is no focal infiltrate. There is no pleural effusion. The cardiac silhouette remains enlarged. The pulmonary vascularity is prominent centrally but there is no cephalization of the vascular pattern.  There is calcification in the wall of the aortic arch. The mediastinum is normal in width. There is no pleural effusion. The bony thorax exhibits no acute abnormality. There degenerative changes of both shoulders. IMPRESSION: Cardiomegaly without pulmonary edema.  No acute pneumonia. Thoracic aortic atherosclerosis. Electronically Signed   By: David  SwazilandJordan M.D.   On: 03/07/2016 14:06   Ct Head Wo Contrast  Result Date: 03/07/2016 CLINICAL DATA:  Altered mental status, weakness and lethargy for 3 days. EXAM: CT HEAD WITHOUT CONTRAST TECHNIQUE: Contiguous axial images were obtained from the base of the skull through the vertex without intravenous contrast. COMPARISON:  05/30/2015 FINDINGS: Brain: Stable age related cerebral atrophy, ventriculomegaly and periventricular white matter disease. New area of low attenuation in the left occipital lobe consistent with an infarct. This does not appear acute and could be subacute or chronic. No extra-axial fluid collections. No intracranial hemorrhage. No mass lesions. Vascular: Stable extensive vascular calcifications but no aneurysm or hyperdense vessels. Skull: No skull fracture or bone lesion. Sinuses/Orbits: Extensive paranasal sinus disease with possible sinonasal polyposis. Evidence of prior sinus surgery. The globes are intact. Other: No scalp lesions. IMPRESSION: Left occipital infarct is new since the prior CT from February 2017 but does not appear acute. It is probably subacute or chronic. No intracranial hemorrhage or mass lesion. Chronic sinus disease with possible sinonasal polyposis. Electronically Signed   By: Rudie MeyerP.  Gallerani M.D.   On: 03/07/2016 14:35   Mr Brain Wo Contrast  Result Date: 03/09/2016 CLINICAL DATA:  82 y/o F; 1 week of increasing confusion and lethargy. EXAM: MRI HEAD WITHOUT CONTRAST TECHNIQUE: Multiplanar, multiecho pulse sequences of the brain and surrounding structures were obtained without intravenous contrast. COMPARISON:   05/03/2012 MRI of the head.  03/07/2016 CT of the head. FINDINGS: Brain: No diffusion signal abnormality. Patchy T2 FLAIR hyperintense white matter signal abnormality minute predominantly perivascular distribution compatible with advanced chronic microvascular ischemic changes. Chronic left occipital infarct. Multiple chronic small lacunar infarcts within the basal ganglia bilaterally, right pons, and bilateral cerebellar hemispheres. Extensive diffuse brain parenchymal volume loss. Punctate focus of susceptibility hypointensity and right posterior temporal lobe is compatible hemosiderin deposition from old microhemorrhage. Vascular: Normal flow voids. Skull and upper cervical spine: Normal marrow signal. Sinuses/Orbits: Extensive paranasal sinus mucosal thickening and opacification of right maxillary, sphenoid, ethmoid, and frontal sinuses with fluid levels. Partial opacification of mastoid tips bilaterally. Other: None. IMPRESSION: 1. No acute intracranial abnormality identified. 2. Advanced chronic microvascular ischemic changes, parenchymal volume loss, multiple chronic lacunar infarcts in  the cerebellum, pons, and basal ganglia, as well as left occipital chronic infarct. Findings are progressed from 2014. 3. Severe paranasal sinus disease with fluid levels which may represent acute on chronic sinusitis in the appropriate clinical setting. Electronically Signed   By: Mitzi Hansen M.D.   On: 03/09/2016 19:08   Dg Chest Port 1 View  Result Date: 03/08/2016 CLINICAL DATA:  Central catheter placement EXAM: PORTABLE CHEST 1 VIEW COMPARISON:  March 07, 2016 FINDINGS: Central catheter tip is at the cavoatrial junction. No pneumothorax. There is atelectatic change in the left mid lung. Lungs elsewhere are clear. Stable cardiomegaly with pulmonary vascularity within normal limits. There is atherosclerotic calcification aorta. No adenopathy. No bone lesions. IMPRESSION: Central catheter tip at  cavoatrial junction without pneumothorax. Stable cardiomegaly. Left midlung atelectasis. No edema or consolidation. Electronically Signed   By: Bretta Bang III M.D.   On: 03/08/2016 13:12     Patient Profile     32F (HAD A BIRTHDAY THIS ADMISSION) with hypertension, recurrent syncope, tachycardia bradycardia syndrome (PSVT), chronic kidney disease stage III, rheumatoid arthritis, orthostatic hypertension, dementia, urinary incontinence was brought to the ED by her daughter with increased confusion and lethargy for past 1 week, associated with poor appetite - found to be in A fib with RVR. Also with heme+ stools/worsening anemia, subacute/chronic stroke, UTI, AKI on CKD (Cr 3.6 on adm), hyperkalemia.   Assessment & Plan    1. Atrial fib RVR (suspected paroxysmal), being managed in the context of history of tachy-brady syndrome - difficult situation. HR remains elevated in the 120s-130s range. She is fortunately asymptomatic. However, she has history of tachy-brady syndrome per Dr. Michaelle Copas notes in 2014. No further details on this are available. She had 3 hospital encounters for syncope in 2017 - Feb 2017 (felt due to orthostasis), April 2017 (felt due to orthostasis), and September 2017 (presumed vasovagal). Family reports HR baseline around 50 at times, but also 70s-80s per daughter's log. Options include permanent pacemaker implantation but her dementia makes her less ideal candidate for this. Cannot cardiovert as she is not presently a candidate for anticoagulation with her GIB and anemia. Will review further recommendations with MD.  2. LV dysfunction by echo 03/08/16 - reduced LVEF 40-45% may be due to AF RVR, consider repeating echo as appropriate when rate better controlled. Otherwise showed sever LAE/RAE, PASP 59.   3. Elevated troponin - no clear trend and not consistent with ACS; pt not a candidate for aggressive cardiac eval.   4. Medical issues above with acute encephalopathy,  subacute/chronic stroke, UTI and AKI on CKD - per IM/GI.   5. GIB/ABL anemia - per GI, suspect NSAID induced gastropathy on Celebrex, continuing conservative management.   6. HTN - labile, follow.  Signed, Laurann Montana, PA-C  03/13/2016, 9:34 AM    Agree with findings by Ronie Spies PA-C  Admitted with confusion. H/O dementia.  She was found to be in AFIB with RVR. H/O TBS. She has a UTI Rx with ATBX. Currently she is on Metop 75 BID. Not a candidate for [facility] or DCCV.  EF 45%. Contin tto Rx infxn. Follow HR. Nothing further to offer at this time.  Runell Gess, M.D., FACP, [facility], [facility] [place] [address] [place], [place]  [address]  [phone] 03/13/2016 12:24 PM

## 2016-03-13 NOTE — Progress Notes (Signed)
Physical Therapy Treatment Patient Details Name: Julliana Whitmyer MRN: [id number] DOB: January 23, 1925 Today's Date: 03/13/2016    History of Present Illness 82 yo female admitted with UTI. Imaging showed a subacute/chronic CVA during this admission. Hx of dementia, incontinence, RA, tachycardia.     PT Comments    Tx session to work on standing and pivoting, if able. Pt required +2 for bed mobility. Max assist to stand with therapist at EOB. Pt only able to stand ~10-15 seconds before sitting abruptly. Attempted a 2nd stand but pt was only able to partially stand. Dyspnea 3/4 with activity. Assessed BP while sitting EOB- 125/111. Assisted pt back to supine. Caregiver assisted during treatment session.   Follow Up Recommendations  Home health PT;Supervision/Assistance - 24 hour     Equipment Recommendations  None recommended by PT    Recommendations for Other Services       Precautions / Restrictions Precautions Precautions: Fall Restrictions Weight Bearing Restrictions: No    Mobility  Bed Mobility Overal bed mobility: Needs Assistance Bed Mobility: Supine to Sit;Sit to Supine     Supine to sit: Mod assist;+2 for safety/equipment;HOB elevated Sit to supine: Total assist;+2 for physical assistance;+2 for safety/equipment   General bed mobility comments: Assist for trunk and LEs. Multimodal cues required. Caregiver assisted with encouraging pt.  Transfers Overall transfer level: Needs assistance Equipment used: Rolling walker (2 wheeled) Transfers: Sit to/from Stand Sit to Stand: Max assist         General transfer comment: x2. Assist to rise, position LEs, stabilize, control descent. Pt only stood for ~10-15 seconds before sitting abruptly. ON 2nd attempt, pt was only able to partially stand.   Ambulation/Gait                 Stairs            Wheelchair Mobility    Modified Rankin (Stroke Patients Only)       Balance Overall balance assessment:  Needs assistance   Sitting balance-Leahy Scale: Poor       Standing balance-Leahy Scale: Poor                      Cognition Arousal/Alertness: Awake/alert Behavior During Therapy: WFL for tasks assessed/performed Overall Cognitive Status: History of cognitive impairments - at baseline                      Exercises      General Comments        Pertinent Vitals/Pain Pain Assessment: No/denies pain    Home Living                      Prior Function            PT Goals (current goals can now be found in the care plan section) Progress towards PT goals: Progressing toward goals    Frequency    Min 3X/week      PT Plan Current plan remains appropriate    Co-evaluation             End of Session Equipment Utilized During Treatment: Gait belt Activity Tolerance: Patient limited by fatigue (Noted dyspnea during activity) Patient left: in bed;with call bell/phone within reach;with bed alarm set;with family/visitor present     Time: 5573-2202 PT Time Calculation (min) (ACUTE ONLY): 36 min  Charges:  $Therapeutic Activity: 23-37 mins  G Codes:       Weston Anna, MPT Pager: [phone]

## 2016-03-14 ENCOUNTER — Inpatient Hospital Stay (HOSPITAL_COMMUNITY): Payer: Medicare Other

## 2016-03-14 LAB — TYPE AND SCREEN
ABO/RH(D): O POS
Antibody Screen: NEGATIVE
Unit division: 0
Unit division: 0
Unit division: 0

## 2016-03-14 LAB — CBC
HEMATOCRIT: 28.8 % — AB (ref 36.0–46.0)
Hemoglobin: 9.2 g/dL — ABNORMAL LOW (ref 12.0–15.0)
MCH: 30.8 pg (ref 26.0–34.0)
MCHC: 31.9 g/dL (ref 30.0–36.0)
MCV: 96.3 fL (ref 78.0–100.0)
Platelets: 186 10*3/uL (ref 150–400)
RBC: 2.99 MIL/uL — ABNORMAL LOW (ref 3.87–5.11)
RDW: 18.6 % — AB (ref 11.5–15.5)
WBC: 9 10*3/uL (ref 4.0–10.5)

## 2016-03-14 LAB — BASIC METABOLIC PANEL
ANION GAP: 5 (ref 5–15)
BUN: 28 mg/dL — ABNORMAL HIGH (ref 6–20)
CALCIUM: 8.3 mg/dL — AB (ref 8.9–10.3)
CHLORIDE: 120 mmol/L — AB (ref 101–111)
CO2: 18 mmol/L — AB (ref 22–32)
Creatinine, Ser: 1.86 mg/dL — ABNORMAL HIGH (ref 0.44–1.00)
GFR calc Af Amer: 27 mL/min — ABNORMAL LOW (ref >60)
GFR calc non Af Amer: 23 mL/min — ABNORMAL LOW (ref >60)
GLUCOSE: 101 mg/dL — AB (ref 65–99)
Potassium: 4.8 mmol/L (ref 3.5–5.1)
Sodium: 143 mmol/L (ref 135–145)

## 2016-03-14 MED ORDER — FUROSEMIDE 10 MG/ML IJ SOLN
40.0000 mg | Freq: Once | INTRAMUSCULAR | Status: AC
  Administered 2016-03-14: 40 mg via INTRAVENOUS
  Filled 2016-03-14: qty 4

## 2016-03-14 MED ORDER — METOPROLOL TARTRATE 25 MG PO TABS
25.0000 mg | ORAL_TABLET | Freq: Once | ORAL | Status: AC
  Administered 2016-03-14: 25 mg via ORAL
  Filled 2016-03-14: qty 1

## 2016-03-14 MED ORDER — METOPROLOL TARTRATE 50 MG PO TABS
100.0000 mg | ORAL_TABLET | Freq: Two times a day (BID) | ORAL | Status: DC
  Administered 2016-03-14: 100 mg via ORAL
  Filled 2016-03-14: qty 2

## 2016-03-14 NOTE — Progress Notes (Addendum)
Pt have 24/7 Caregivers with [facility] HHRN/PT/SLP and plan to discharge back home.

## 2016-03-14 NOTE — Progress Notes (Signed)
Physical Therapy Treatment Patient Details Name: Darlene Daniels MRN: [id number] DOB: 03/06/1925 Today's Date: 03/14/2016    History of Present Illness 82 yo female admitted with UTI. Imaging showed a subacute/chronic CVA during this admission. Hx of dementia, incontinence, RA, tachycardia.     PT Comments    Continuing to work with pt as tolerated. She fatigues easily, and becomes dyspneic, with minimal activity. After 2nd standing attempt (same as yesterday), pt seems to become less responsive. She could barely keep her hands on walker handles. Deferred further standing attempts and assisted pt back to supine. Family present during session. Daughter is very encouraging and tries to get pt to do as much as she can. However, the patient just cannot tolerate very much activity currently.    Follow Up Recommendations  Home health PT;Supervision/Assistance - 24 hour (pt already has 24 hour care at home)     Equipment Recommendations  None recommended by PT    Recommendations for Other Services       Precautions / Restrictions Precautions Precautions: Fall Restrictions Weight Bearing Restrictions: No    Mobility  Bed Mobility Overal bed mobility: Needs Assistance Bed Mobility: Supine to Sit;Sit to Supine     Supine to sit: Mod assist;+2 for physical assistance;+2 for safety/equipment;HOB elevated Sit to supine: Total assist;+2 for physical assistance;+2 for safety/equipment   General bed mobility comments: Assist for trunk and LEs. Multimodal cues required. Increased time. Utilized bedpad to assist with scooting positioning.   Transfers Overall transfer level: Needs assistance Equipment used: Rolling walker (2 wheeled) Transfers: Sit to/from Stand;Lateral/Scoot Transfers Sit to Stand: Max assist;+2 physical assistance;+2 safety/equipment;From elevated surface        Lateral/Scoot Transfers: Mod assist;+2 physical assistance;+2 safety/equipment General transfer comment:  x2. Assist to rise, position LEs, stabilize, control descent. Pt only able to stand for ~5-10 seconds before sitting abruptly. ON 2nd attempt, pt was only able to partially stand.  She fatigues very easily with minimal activity. Dyspneic with minimal activity as well. Lateral scoot towards HOB x3 with +2 assist and use of bedpad.  Pt was unable to safely attempt pivot on today.  Ambulation/Gait                 Stairs            Wheelchair Mobility    Modified Rankin (Stroke Patients Only)       Balance Overall balance assessment: Needs assistance           Standing balance-Leahy Scale: Poor                      Cognition Arousal/Alertness: Awake/alert Behavior During Therapy: WFL for tasks assessed/performed Overall Cognitive Status: History of cognitive impairments - at baseline                      Exercises      General Comments        Pertinent Vitals/Pain Pain Assessment: No/denies pain    Home Living                      Prior Function            PT Goals (current goals can now be found in the care plan section) Progress towards PT goals: Not progressing toward goals - comment (on last 2 sessions, pt has not been able to pivot. )    Frequency    Min 3X/week  PT Plan Current plan remains appropriate    Co-evaluation             End of Session Equipment Utilized During Treatment: Gait belt Activity Tolerance: Patient limited by fatigue Patient left: in bed;with call bell/phone within reach;with bed alarm set;with family/visitor present     Time: 1101-1119 PT Time Calculation (min) (ACUTE ONLY): 18 min  Charges:  $Therapeutic Activity: 8-22 mins                    G Codes:      Rebeca Alert, MPT Pager: [phone]

## 2016-03-14 NOTE — Progress Notes (Signed)
PROGRESS NOTE                                                                                                                                                                                                             Patient Demographics:    Darlene RicherDorothy Daniels, is a [age] y.o. female, DOB - [birth year], WUJ:[id number]  Admit date - 03/07/2016   Admitting Physician Starleen Armsawood S Maki Hege, MD  Outpatient Primary MD for the patient is Alva GarnetSHELTON,KIMBERLY R., MD  LOS - 7  Outpatient Specialists: none  Chief Complaint  Patient presents with  . Altered Mental Status       Brief Narrative  82 year old female with history of hypertension, recurrent syncope, tachybrady syndrome, chronic kidney disease stage III, rheumatoid arthritis, orthostatic hypertension, dementia, urinary incontinence , Presents to ED with encephalopathy, secondary to UTI, as well workup significant for new onset A. fib , and elevated troponins , Head CT showing subacute/chronic left occipital infarct (new from CT since February 2017) but MRI showing chronic infarct, Seen by cardiology for A. fib management, initially on heparin GTT which has been stopped secondary to GI bleed.required  PRBC transfusion, no recurrence of GI bleed after holding anticoagulation. - Patient in A. fib with RVR, heart rates very hard to control , especially with known history of tachybradycardia syndrome with history of bradycardia in the past, the medication has been titrated slowly.   Subjective:   Patient Reports she is feeling better today,Denies any dyspnea, cough or chest pain, but she was noticed to be in mild respiratory distress.   Assessment  & Plan :   Atrial fibrillation with RVR (HCC) - New onset. - Initially on Cardizem drip, currently on by mouth metoprolol, Managed by cardiology , she is on metoprolol which has been titrated gradually and slowly given history of  tachybradycardia syndrome and bradycardia/syncope in the past , she remains significant RVR today , D/W Dr Allyson SabalBerry, will monitor today 100 mg oral twice a day , and given patient is symptomatic, will tolerate heart rate in the 120s and 130s on discharge , and she is not a candidate for pacemaker . - CHA2DS2VASc of 6. Initially on heparin GTT,NOT  Anticoagulation candidate currently given recent anemia, GI bleeding and recurrent falls. -2-D echo repeated with EF of 40-45% and diffuse hypokinesis (new compared to last echo 2 months  back). Likely due to rapid A. Fib.  Respiratory distress - Patient was noticed to be tachypneic, with bibasilar crackles, chest x-ray showing evidence of volume overload, will discontinue IV fluids, give one dose of IV Lasix, and repeat x-ray in a.m..  UTI -Treated with IV Rocephin, urine  cultures with no growth  Acute metabolic encephalopathy -Worsened mentation on admission. Patient with underlying dementia, and with encephalopathy due to  UTI and chronic stroke.  - MRI brain with chronic multiple lacunar infarcts - Improving  GI bleed - GI consult appreciated, this is most likely lower GI bleed (given minimal amount of fresh blood with stool ) in the setting of anticoagulation, hemoglobin stable posttransfusion since holding heparin GTT .plan with conservative management . - 2 units PRBC transfusion 11/17  Acute on chronic kidney injury stage III -Suspect secondary to dehydration and UTI.  -Back to baseline on IV hydration  Hyperkalemia -Resolved  New Systolic CHF -Noted for low EF of 40-45%, Likely due to rapid A. fib. On beta blockers, not ACE I/ARB candidate given poor renal function. -has remained Euvolemic on exam. -will follow daily weights and strict intake and output   Elevated troponin -Chronic and in the setting of A. Fib with RVR.  -No chest pain symptoms or other acute EKG changes. -cardiology on board  Old  CVA (cerebral vascular accident)  (HCC) -chronic  left occipital infarct seen on head CT.  -MRI brain with evidence of chronic multiple lacunar infarcts -continue risk factors modifications   Dementia -Continue Namenda.  Rheumatoid arthritis -was On Celebrex. Will continue holding given poor renal function  Anemia - Acute on chronic iron deficiency, worsened most likely due to GI bleed - Hold heparin GTT, last used units PRBC 11/17 given hemoglobin of 6.8,  Acute on chronic sinusitis - Patient on Rocephin,  changed to  11/18 to Augmentin   Essential hypertension  -continue hydralazine, on metoprolol  Poor IV access and limitation to blood draw. -Discussed options for IV access with daughter and she prefers having a PICC line or midline for blood draws.  -PICC line in place now   Code Status : Full code   Family Communication  :  daughter at bedside  Disposition Plan  : Home with home care when HR controlled .  Consults  :  Cardiology,GI  Procedures  : 2-D echo:   DVT Prophylaxis  : IV heparin stopped 11/17 , SCD  Lab Results  Component Value Date   PLT 186 03/14/2016    Antibiotics  :    Anti-infectives    Start     Dose/Rate Route Frequency Ordered Stop   03/13/16 1000  amoxicillin-clavulanate (AUGMENTIN) 500-125 MG per tablet 500 mg     1 tablet Oral Every 12 hours 03/13/16 0718     03/11/16 1200  amoxicillin-clavulanate (AUGMENTIN) 875-125 MG per tablet 1 tablet  Status:  Discontinued     1 tablet Oral Every 12 hours 03/11/16 1125 03/13/16 0717   03/08/16 1700  cefTRIAXone (ROCEPHIN) 1 g in dextrose 5 % 50 mL IVPB  Status:  Discontinued     1 g 100 mL/hr over 30 Minutes Intravenous Every 24 hours 03/07/16 2052 03/11/16 1125   03/07/16 1645  cefTRIAXone (ROCEPHIN) 1 g in dextrose 5 % 50 mL IVPB     1 g 100 mL/hr over 30 Minutes Intravenous  Once 03/07/16 1638 03/07/16 1741        Objective:   Vitals:   03/13/16 2103 03/14/16 0558 03/14/16  1042 03/14/16 1351  BP: 118/85 (!) 131/102   (!) 127/105  Pulse: 85 96 (!) 101 (!) 129  Resp: 18 20  20   Temp: 98.2 F (36.8 C) 98.6 F (37 C)  98.6 F (37 C)  TempSrc: Oral Oral  Oral  SpO2: 97% 98%  97%  Weight:      Height:        Wt Readings from Last 3 Encounters:  03/07/16 74.3 kg (163 lb 12.8 oz)  01/04/16 77.6 kg (171 lb)  08/12/15 79.4 kg (175 lb)     Intake/Output Summary (Last 24 hours) at 03/14/16 1420 Last data filed at 03/14/16 1300  Gross per 24 hour  Intake          1708.33 ml  Output                0 ml  Net          1708.33 ml     Physical Exam  Gen: not in distress, More awake, alert and appropriate today  HEENT:  moist mucosa, supple neck, no JVD Chest: Mild bibasilar crackles, mild use of accessory muscle, the CVS: S1 and S2 irregular, No murmurs rub or gallop GI: soft, NT, ND, BS+ Musculoskeletal: warm, no edema CNS:, moving four limbs and able to follow simple commands     Data Review:    CBC  Recent Labs Lab 03/09/16 0600 03/10/16 0347  03/11/16 0232 03/11/16 1200 03/12/16 0357 03/13/16 0515 03/14/16 0416  WBC 8.9 8.9  --   --   --  9.1 8.9 9.0  HGB 7.5* 6.8*  < > 9.6* 9.8* 9.2* 9.2* 9.2*  HCT 22.6* 20.5*  < > 28.8* 30.1* 28.3* 28.5* 28.8*  PLT 267 227  --   --   --  188 193 186  MCV 95.8 96.2  --   --   --  93.4 95.3 96.3  MCH 31.8 31.9  --   --   --  30.4 30.8 30.8  MCHC 33.2 33.2  --   --   --  32.5 32.3 31.9  RDW 16.2* 16.3*  --   --   --  18.4* 18.5* 18.6*  < > = values in this interval not displayed.  Chemistries   Recent Labs Lab 03/08/16 1423 03/09/16 0642 03/10/16 0347 03/12/16 0357 03/13/16 0515 03/14/16 0416  NA 141 143 142 142 144 143  K 4.9 5.5* 4.9 4.9 4.7 4.8  CL 113* 116* 117* 120* 120* 120*  CO2 21* 20* 20* 18* 18* 18*  GLUCOSE 144* 127* 143* 110* 98 101*  BUN 47* 42* 42* 36* 27* 28*  CREATININE 2.57* 2.32* 2.12* 1.86* 1.84* 1.86*  CALCIUM 7.8* 7.8* 8.1* 8.2* 8.3* 8.3*  MG 2.0  --   --   --   --   --     Lab Results  Component Value  Date   HGBA1C 6.3 (H) 05/30/2015   Coagulation profile  Recent Labs Lab 03/07/16 1818  INR 1.31   Cardiac Enzymes  Recent Labs Lab 03/07/16 2155  TROPONINI 0.18*   ------------------------------------------------------------------------------------------------------------------    Component Value Date/Time   BNP 719.0 (H) 05/31/2015 1304    Inpatient Medications  Scheduled Meds: . amoxicillin-clavulanate  1 tablet Oral Q12H  . B-complex with vitamin C  1 tablet Oral Daily  . cholecalciferol  5,000 Units Oral Daily  . feeding supplement (ENSURE ENLIVE)  237 mL Oral BID BM  . ferrous sulfate  325 mg Oral QPC  breakfast  . folic acid  1 mg Oral Daily  . gabapentin  100 mg Oral QPM  . megestrol  400 mg Oral QPM  . memantine  28 mg Oral QPM  . metoprolol tartrate  100 mg Oral BID  . multivitamin with minerals  1 tablet Oral Daily  . oxybutynin  5 mg Oral BID  . pantoprazole  40 mg Oral Daily  . polyvinyl alcohol  1 drop Both Eyes BID  . senna  1-2 tablet Oral QHS  . sodium chloride  500 mL Intravenous Once  . sodium chloride flush  10-40 mL Intracatheter Q12H  . vitamin C  1,000 mg Oral Daily   Continuous Infusions:  Micro Results Recent Results (from the past 240 hour(s))  Blood culture (routine x 2)     Status: None   Collection Time: 03/07/16  3:36 PM  Result Value Ref Range Status   Specimen Description BLOOD RIGHT ANTECUBITAL  Final   Special Requests BOTTLES DRAWN AEROBIC AND ANAEROBIC 5 CC  Final   Culture   Final    NO GROWTH 5 DAYS Performed at [facility]    Report Status 03/12/2016 FINAL  Final  Blood culture (routine x 2)     Status: None   Collection Time: 03/07/16  3:36 PM  Result Value Ref Range Status   Specimen Description BLOOD LEFT ANTECUBITAL  Final   Special Requests BOTTLES DRAWN AEROBIC ONLY 6 CC  Final   Culture   Final    NO GROWTH 5 DAYS Performed at [facility]    Report Status 03/12/2016 FINAL  Final  Urine  culture     Status: None   Collection Time: 03/07/16  3:51 PM  Result Value Ref Range Status   Specimen Description URINE, CATHETERIZED  Final   Special Requests NONE  Final   Culture NO GROWTH Performed at [facility]   Final   Report Status 03/08/2016 FINAL  Final  MRSA PCR Screening     Status: None   Collection Time: 03/07/16  8:58 PM  Result Value Ref Range Status   MRSA by PCR NEGATIVE NEGATIVE Final    Comment:        The GeneXpert MRSA Assay (FDA approved for NASAL specimens only), is one component of a comprehensive MRSA colonization surveillance program. It is not intended to diagnose MRSA infection nor to guide or monitor treatment for MRSA infections.     Radiology Reports Dg Chest 2 View  Result Date: 03/07/2016 CLINICAL DATA:  Altered mental status, lethargy for the past 3 days. EXAM: CHEST  2 VIEW COMPARISON:  PA and lateral chest x-ray of January 04, 2016 FINDINGS: The lungs are adequately inflated. There is no focal infiltrate. There is no pleural effusion. The cardiac silhouette remains enlarged. The pulmonary vascularity is prominent centrally but there is no cephalization of the vascular pattern. There is calcification in the wall of the aortic arch. The mediastinum is normal in width. There is no pleural effusion. The bony thorax exhibits no acute abnormality. There degenerative changes of both shoulders. IMPRESSION: Cardiomegaly without pulmonary edema.  No acute pneumonia. Thoracic aortic atherosclerosis. Electronically Signed   By: David  [place] M.D.   On: 03/07/2016 14:06   Ct Head Wo Contrast  Result Date: 03/07/2016 CLINICAL DATA:  Altered mental status, weakness and lethargy for 3 days. EXAM: CT HEAD WITHOUT CONTRAST TECHNIQUE: Contiguous axial images were obtained from the base of the skull through the vertex without intravenous  contrast. COMPARISON:  05/30/2015 FINDINGS: Brain: Stable age related cerebral atrophy, ventriculomegaly and  periventricular white matter disease. New area of low attenuation in the left occipital lobe consistent with an infarct. This does not appear acute and could be subacute or chronic. No extra-axial fluid collections. No intracranial hemorrhage. No mass lesions. Vascular: Stable extensive vascular calcifications but no aneurysm or hyperdense vessels. Skull: No skull fracture or bone lesion. Sinuses/Orbits: Extensive paranasal sinus disease with possible sinonasal polyposis. Evidence of prior sinus surgery. The globes are intact. Other: No scalp lesions. IMPRESSION: Left occipital infarct is new since the prior CT from February 2017 but does not appear acute. It is probably subacute or chronic. No intracranial hemorrhage or mass lesion. Chronic sinus disease with possible sinonasal polyposis. Electronically Signed   By: Rudie Meyer M.D.   On: 03/07/2016 14:35   Mr Brain Wo Contrast  Result Date: 03/09/2016 CLINICAL DATA:  82 y/o F; 1 week of increasing confusion and lethargy. EXAM: MRI HEAD WITHOUT CONTRAST TECHNIQUE: Multiplanar, multiecho pulse sequences of the brain and surrounding structures were obtained without intravenous contrast. COMPARISON:  05/03/2012 MRI of the head.  03/07/2016 CT of the head. FINDINGS: Brain: No diffusion signal abnormality. Patchy T2 FLAIR hyperintense white matter signal abnormality minute predominantly perivascular distribution compatible with advanced chronic microvascular ischemic changes. Chronic left occipital infarct. Multiple chronic small lacunar infarcts within the basal ganglia bilaterally, right pons, and bilateral cerebellar hemispheres. Extensive diffuse brain parenchymal volume loss. Punctate focus of susceptibility hypointensity and right posterior temporal lobe is compatible hemosiderin deposition from old microhemorrhage. Vascular: Normal flow voids. Skull and upper cervical spine: Normal marrow signal. Sinuses/Orbits: Extensive paranasal sinus mucosal thickening  and opacification of right maxillary, sphenoid, ethmoid, and frontal sinuses with fluid levels. Partial opacification of mastoid tips bilaterally. Other: None. IMPRESSION: 1. No acute intracranial abnormality identified. 2. Advanced chronic microvascular ischemic changes, parenchymal volume loss, multiple chronic lacunar infarcts in the cerebellum, pons, and basal ganglia, as well as left occipital chronic infarct. Findings are progressed from 2014. 3. Severe paranasal sinus disease with fluid levels which may represent acute on chronic sinusitis in the appropriate clinical setting. Electronically Signed   By: Mitzi Hansen M.D.   On: 03/09/2016 19:08   Dg Chest Port 1 View  Result Date: 03/14/2016 CLINICAL DATA:  Cough, congestion. EXAM: PORTABLE CHEST 1 VIEW COMPARISON:  Radiograph of March 08, 2016. FINDINGS: Stable cardiomegaly. Atherosclerosis of thoracic aorta is noted. Right-sided PICC line is unchanged in position. No pneumothorax is noted. Increased bibasilar opacities are noted concerning for edema or atelectasis with associated pleural effusions. Bony thorax is unremarkable. IMPRESSION: Aortic atherosclerosis. Increased bibasilar edema or atelectasis with associated pleural effusions. Electronically Signed   By: Lupita Raider, M.D.   On: 03/14/2016 13:51   Dg Chest Port 1 View  Result Date: 03/08/2016 CLINICAL DATA:  Central catheter placement EXAM: PORTABLE CHEST 1 VIEW COMPARISON:  March 07, 2016 FINDINGS: Central catheter tip is at the cavoatrial junction. No pneumothorax. There is atelectatic change in the left mid lung. Lungs elsewhere are clear. Stable cardiomegaly with pulmonary vascularity within normal limits. There is atherosclerotic calcification aorta. No adenopathy. No bone lesions. IMPRESSION: Central catheter tip at cavoatrial junction without pneumothorax. Stable cardiomegaly. Left midlung atelectasis. No edema or consolidation. Electronically Signed   By:  Bretta Bang III M.D.   On: 03/08/2016 13:12     Makenze Ellett M.D on 03/14/2016 at 2:20 PM  Between 7am to 7pm - Pager - [phone]  After 7pm go to [email or web address] - password [facility]  [facility] -  Office  [phone]

## 2016-03-15 ENCOUNTER — Inpatient Hospital Stay (HOSPITAL_COMMUNITY): Payer: Medicare Other

## 2016-03-15 DIAGNOSIS — N390 Urinary tract infection, site not specified: Secondary | ICD-10-CM

## 2016-03-15 DIAGNOSIS — N170 Acute kidney failure with tubular necrosis: Secondary | ICD-10-CM

## 2016-03-15 DIAGNOSIS — Z515 Encounter for palliative care: Secondary | ICD-10-CM

## 2016-03-15 DIAGNOSIS — Z7189 Other specified counseling: Secondary | ICD-10-CM

## 2016-03-15 DIAGNOSIS — N183 Chronic kidney disease, stage 3 (moderate): Secondary | ICD-10-CM

## 2016-03-15 DIAGNOSIS — R4182 Altered mental status, unspecified: Secondary | ICD-10-CM

## 2016-03-15 LAB — BASIC METABOLIC PANEL
Anion gap: 5 (ref 5–15)
BUN: 26 mg/dL — AB (ref 6–20)
CHLORIDE: 118 mmol/L — AB (ref 101–111)
CO2: 20 mmol/L — AB (ref 22–32)
CREATININE: 2.06 mg/dL — AB (ref 0.44–1.00)
Calcium: 8.3 mg/dL — ABNORMAL LOW (ref 8.9–10.3)
GFR calc Af Amer: 23 mL/min — ABNORMAL LOW (ref >60)
GFR calc non Af Amer: 20 mL/min — ABNORMAL LOW (ref >60)
GLUCOSE: 101 mg/dL — AB (ref 65–99)
Potassium: 4.8 mmol/L (ref 3.5–5.1)
SODIUM: 143 mmol/L (ref 135–145)

## 2016-03-15 LAB — CBC
HEMATOCRIT: 27.9 % — AB (ref 36.0–46.0)
HEMOGLOBIN: 8.8 g/dL — AB (ref 12.0–15.0)
MCH: 30.2 pg (ref 26.0–34.0)
MCHC: 31.5 g/dL (ref 30.0–36.0)
MCV: 95.9 fL (ref 78.0–100.0)
Platelets: 188 10*3/uL (ref 150–400)
RBC: 2.91 MIL/uL — ABNORMAL LOW (ref 3.87–5.11)
RDW: 18.2 % — ABNORMAL HIGH (ref 11.5–15.5)
WBC: 8.3 10*3/uL (ref 4.0–10.5)

## 2016-03-15 MED ORDER — DILTIAZEM HCL 30 MG PO TABS
30.0000 mg | ORAL_TABLET | Freq: Four times a day (QID) | ORAL | Status: DC
  Administered 2016-03-15 – 2016-03-16 (×4): 30 mg via ORAL
  Filled 2016-03-15 (×4): qty 1

## 2016-03-15 NOTE — Progress Notes (Addendum)
[facility] Liaison  [facility] Room 1445  Notified by [facility] of family request for information about [facility] services after discharge.  Met with patient's daughter, Freda Munro and son, Herbie Baltimore.  [facility] services explained, with good understanding verbalized.    Freda Munro and Herbie Baltimore  do not commit to [facility] services at this time, and state they will call [facility] with a decision.  Explained this writer will not be in the hospital on 11/23, but will return on 11/24.  [facility] 24/7 number given to daughter, Freda Munro.  Thank you, Efrain Sella, RN, BSN [phone]

## 2016-03-15 NOTE — Progress Notes (Signed)
Patient Name: Darlene Daniels Date of Encounter: 03/15/2016  Primary Cardiologist: Dr. Alicia Amel Problem List     Principal Problem:   Acute encephalopathy Active Problems:   Rheumatoid arthritis (HCC)   Acute on chronic renal failure (HCC)   Dementia   UTI (urinary tract infection)   Atrial fibrillation with RVR (HCC)   CVA (cerebral vascular accident) (HCC)   Left ventricular systolic dysfunction   Acute post-hemorrhagic anemia   Heme positive stool   Lower GI bleed    Subjective   Pt's daughter is concerned that metoprolol is causing labored breathing. Patient is also somewhat more sleepy and more out of it today.  Inpatient Medications    . amoxicillin-clavulanate  1 tablet Oral Q12H  . B-complex with vitamin C  1 tablet Oral Daily  . cholecalciferol  5,000 Units Oral Daily  . diltiazem  30 mg Oral Q6H  . feeding supplement (ENSURE ENLIVE)  237 mL Oral BID BM  . ferrous sulfate  325 mg Oral QPC breakfast  . folic acid  1 mg Oral Daily  . gabapentin  100 mg Oral QPM  . megestrol  400 mg Oral QPM  . memantine  28 mg Oral QPM  . multivitamin with minerals  1 tablet Oral Daily  . oxybutynin  5 mg Oral BID  . pantoprazole  40 mg Oral Daily  . polyvinyl alcohol  1 drop Both Eyes BID  . senna  1-2 tablet Oral QHS  . sodium chloride  500 mL Intravenous Once  . sodium chloride flush  10-40 mL Intracatheter Q12H  . vitamin C  1,000 mg Oral Daily    Vital Signs    Vitals:   03/14/16 1042 03/14/16 1351 03/14/16 2237 03/15/16 0532  BP:  (!) 127/105 (!) 121/92 (!) 127/104  Pulse: (!) 101 (!) 129 (!) 135 (!) 112  Resp:  20 (!) 22 (!) 22  Temp:  98.6 F (37 C) 98 F (36.7 C) 98.8 F (37.1 C)  TempSrc:  Oral Oral Oral  SpO2:  97% 98% 96%  Weight:      Height:        Intake/Output Summary (Last 24 hours) at 03/15/16 0940 Last data filed at 03/14/16 1539  Gross per 24 hour  Intake              470 ml  Output                0 ml  Net              470 ml     Filed Weights   03/07/16 2050  Weight: 163 lb 12.8 oz (74.3 kg)    Physical Exam    General: Well developed, well nourished frail elderly AAF, in no acute distress. HEENT: Normocephalic, atraumatic, sclera non-icteric, no xanthomas, nares are without discharge. Neck: JVP not elevated. Lungs: Clear bilaterally to auscultation without wheezes, rales, or rhonchi. Breathing is unlabored - slightly increased RR Cardiac: Rapid, irregular, S1 S2 without murmurs, rubs, or gallops.  Abdomen: Soft, non-tender, non-distended with normoactive bowel sounds. No rebound/guarding. Extremities: No clubbing or cyanosis. No edema. Distal pedal pulses are 2+ and equal bilaterally. Skin: Warm and dry, no significant rash. Neuro: Sleepy, arousable - unable to tell me where she is, answers straightforward symptom questions appropriately Psych:  Pleasant affect  Labs    CBC  Recent Labs  03/14/16 0416 03/15/16 0440  WBC 9.0 8.3  HGB 9.2* 8.8*  HCT 28.8*  27.9*  MCV 96.3 95.9  PLT 186 188   Basic Metabolic Panel  Recent Labs  03/14/16 0416 03/15/16 0440  NA 143 143  K 4.8 4.8  CL 120* 118*  CO2 18* 20*  GLUCOSE 101* 101*  BUN 28* 26*  CREATININE 1.86* 2.06*  CALCIUM 8.3* 8.3*    Telemetry    Continued AF RVR 120s-130s  Radiology    Dg Chest 2 View  Result Date: 03/07/2016 CLINICAL DATA:  Altered mental status, lethargy for the past 3 days. EXAM: CHEST  2 VIEW COMPARISON:  PA and lateral chest x-ray of January 04, 2016 FINDINGS: The lungs are adequately inflated. There is no focal infiltrate. There is no pleural effusion. The cardiac silhouette remains enlarged. The pulmonary vascularity is prominent centrally but there is no cephalization of the vascular pattern. There is calcification in the wall of the aortic arch. The mediastinum is normal in width. There is no pleural effusion. The bony thorax exhibits no acute abnormality. There degenerative changes of both shoulders.  IMPRESSION: Cardiomegaly without pulmonary edema.  No acute pneumonia. Thoracic aortic atherosclerosis. Electronically Signed   By: David  [place] M.D.   On: 03/07/2016 14:06   Ct Head Wo Contrast  Result Date: 03/07/2016 CLINICAL DATA:  Altered mental status, weakness and lethargy for 3 days. EXAM: CT HEAD WITHOUT CONTRAST TECHNIQUE: Contiguous axial images were obtained from the base of the skull through the vertex without intravenous contrast. COMPARISON:  05/30/2015 FINDINGS: Brain: Stable age related cerebral atrophy, ventriculomegaly and periventricular white matter disease. New area of low attenuation in the left occipital lobe consistent with an infarct. This does not appear acute and could be subacute or chronic. No extra-axial fluid collections. No intracranial hemorrhage. No mass lesions. Vascular: Stable extensive vascular calcifications but no aneurysm or hyperdense vessels. Skull: No skull fracture or bone lesion. Sinuses/Orbits: Extensive paranasal sinus disease with possible sinonasal polyposis. Evidence of prior sinus surgery. The globes are intact. Other: No scalp lesions. IMPRESSION: Left occipital infarct is new since the prior CT from February 2017 but does not appear acute. It is probably subacute or chronic. No intracranial hemorrhage or mass lesion. Chronic sinus disease with possible sinonasal polyposis. Electronically Signed   By: Rudie Meyer M.D.   On: 03/07/2016 14:35   Mr Brain Wo Contrast  Result Date: 03/09/2016 CLINICAL DATA:  82 y/o F; 1 week of increasing confusion and lethargy. EXAM: MRI HEAD WITHOUT CONTRAST TECHNIQUE: Multiplanar, multiecho pulse sequences of the brain and surrounding structures were obtained without intravenous contrast. COMPARISON:  05/03/2012 MRI of the head.  03/07/2016 CT of the head. FINDINGS: Brain: No diffusion signal abnormality. Patchy T2 FLAIR hyperintense white matter signal abnormality minute predominantly perivascular distribution  compatible with advanced chronic microvascular ischemic changes. Chronic left occipital infarct. Multiple chronic small lacunar infarcts within the basal ganglia bilaterally, right pons, and bilateral cerebellar hemispheres. Extensive diffuse brain parenchymal volume loss. Punctate focus of susceptibility hypointensity and right posterior temporal lobe is compatible hemosiderin deposition from old microhemorrhage. Vascular: Normal flow voids. Skull and upper cervical spine: Normal marrow signal. Sinuses/Orbits: Extensive paranasal sinus mucosal thickening and opacification of right maxillary, sphenoid, ethmoid, and frontal sinuses with fluid levels. Partial opacification of mastoid tips bilaterally. Other: None. IMPRESSION: 1. No acute intracranial abnormality identified. 2. Advanced chronic microvascular ischemic changes, parenchymal volume loss, multiple chronic lacunar infarcts in the cerebellum, pons, and basal ganglia, as well as left occipital chronic infarct. Findings are progressed from 2014. 3. Severe paranasal sinus disease with  fluid levels which may represent acute on chronic sinusitis in the appropriate clinical setting. Electronically Signed   By: Mitzi Hansen M.D.   On: 03/09/2016 19:08   Dg Chest Port 1 View  Result Date: 03/15/2016 CLINICAL DATA:  Shortness of Breath EXAM: PORTABLE CHEST 1 VIEW COMPARISON:  03/14/2016 FINDINGS: Cardiac shadow remains enlarged. Aortic calcifications are again seen. Bilateral pleural effusions are again noted. A right-sided PICC line is seen in satisfactory position. No new focal abnormality is noted. IMPRESSION: No significant change from the prior exam. Small bilateral pleural effusions are seen. Electronically Signed   By: Alcide Clever M.D.   On: 03/15/2016 07:45   Dg Chest Port 1 View  Result Date: 03/14/2016 CLINICAL DATA:  Cough, congestion. EXAM: PORTABLE CHEST 1 VIEW COMPARISON:  Radiograph of March 08, 2016. FINDINGS: Stable  cardiomegaly. Atherosclerosis of thoracic aorta is noted. Right-sided PICC line is unchanged in position. No pneumothorax is noted. Increased bibasilar opacities are noted concerning for edema or atelectasis with associated pleural effusions. Bony thorax is unremarkable. IMPRESSION: Aortic atherosclerosis. Increased bibasilar edema or atelectasis with associated pleural effusions. Electronically Signed   By: Lupita Raider, M.D.   On: 03/14/2016 13:51   Dg Chest Port 1 View  Result Date: 03/08/2016 CLINICAL DATA:  Central catheter placement EXAM: PORTABLE CHEST 1 VIEW COMPARISON:  March 07, 2016 FINDINGS: Central catheter tip is at the cavoatrial junction. No pneumothorax. There is atelectatic change in the left mid lung. Lungs elsewhere are clear. Stable cardiomegaly with pulmonary vascularity within normal limits. There is atherosclerotic calcification aorta. No adenopathy. No bone lesions. IMPRESSION: Central catheter tip at cavoatrial junction without pneumothorax. Stable cardiomegaly. Left midlung atelectasis. No edema or consolidation. Electronically Signed   By: Bretta Bang III M.D.   On: 03/08/2016 13:12     Patient Profile     50F (HAD A BIRTHDAY THIS ADMISSION) with hypertension, recurrent syncope, tachycardia bradycardia syndrome (PSVT), chronic kidney disease stage III, rheumatoid arthritis, orthostatic hypertension, dementia, urinary incontinence was brought to the ED by her daughter with increased confusion and lethargy for past 1 week, associated with poor appetite - found to be in A fib with RVR. Also with heme+ stools/worsening anemia, subacute/chronic stroke, UTI, AKI on CKD (Cr 3.6 on adm), hyperkalemia. Has history of tachy-brady syndrome per Dr. Michaelle Copas notes in 2014. No further details on this are available. She had 3 hospital encounters for syncope in 2017 - Feb 2017 (felt due to orthostasis), April 2017 (felt due to orthostasis), and September 2017 (presumed vasovagal).  Family reports HR baseline around 50 at times, but also 70s-80s per daughter's log.   Assessment & Plan    1. Atrial fib RVR (suspected paroxysmal), being managed in the context of history of tachy-brady syndrome - difficult situation. HR remains elevated in the 120s-130s range despite titration of metoprolol. She has slightly increased labored breathing this AM, difficult to know if related to HR or beta blocker. She is not a candidate for pacemaker given her advanced dementia and not a candidate for cardioversion as she cannot be anticoagulated due to her GIB. I think it's worthwhile discontinuing the metoprolol and trying short-acting oral diltiazem as some patients respond better to CCB than others. I had a long discussion with the patient's daughter. I told her I am not convinced we will be able to make significant headway with regards to her HR, especially without running the risk of eventually running into problems with bradycardia. She comprehends this well. I think  the above change to dilt is worthwhile given her increased RR, but ultimately Ms. Ruffing is showing evidence of multiorgan failure with worsening renal function, encephalopathy, GIB and elevated troponin. I told the daughter we are seeing evidence of body dysfunctions that may not be reversible. The patient's daughter is very familiar with hospice care as she cared for her father who passed away after a battle with Alzheimer's. She is interested in speaking with palliative care. I called and spoke with the consult receiver for their team and they will see the patient. Ms. Dancer' daughter's goal is for her mother to have comfortable quality of life.  2. LV dysfunction by echo 03/08/16 - reduced LVEF 40-45% may be due to AF RVR. Even though CCB less ideal with LV dysfunction, this may be tachy-induced so it's worth trying. No ACEI/ARB due to renal failure.  3. Elevated troponin - no clear trend and not consistent with ACS; pt not a  candidate for aggressive cardiac eval.   4. Medical issues above with acute encephalopathy, subacute/chronic stroke, UTI, AKI on CKD, GIB/ABL anemia - per IM/GI. Per GI, suspect NSAID induced gastropathy on Celebrex, continuing conservative management.   5. HTN - labile, follow.  Signed, Laurann Montana, PA-C  03/15/2016, 9:40 AM   Agree with findings by Ronie Spies PA-C  HR still increased in Afib with RVR. Not particularly responsive to high dose BB. Pt is lethargic and somewhat encephalopathic. Not a OAC candidate and therefore can't cardiovert. It's reasonable to try CCB (immed release dilt) and follow HR. Agree with palliative care.   Runell Gess, M.D., FACP, [facility], [facility] [address] [place], [place]  [address]  [phone] 03/15/2016 11:20 AM

## 2016-03-15 NOTE — Progress Notes (Signed)
Daughter Velna Hatchet would like to speak with someone of [facility] concerning Home with Hospice and information on what can be done. Referral given to Toniann Fail, RN with [facility].

## 2016-03-15 NOTE — Progress Notes (Signed)
PROGRESS NOTE    Darlene Daniels  PNT:[id number] DOB: [birth year] DOA: 03/07/2016 PCP: Salena Saner., MD    Brief Narrative:  82 year old female with history of hypertension, recurrent syncope, tachybrady syndrome, chronic kidney disease stage III, rheumatoid arthritis, orthostatic hypertension, dementia, urinary incontinence , Presents to ED with encephalopathy, secondary to UTI, as well workup significant for new onset A. fib , and elevated troponins , Head CT showing subacute/chronic left occipital infarct (new from CT since February 2017) but MRI showing chronic infarct, Seen by cardiology for A. fib management, initially on heparin GTT which has been stopped secondary to GI bleed.required  PRBC transfusion, no recurrence of GI bleed after holding anticoagulation. - Patient in A. fib with RVR, heart rates very hard to control , especially with known history of tachybradycardia syndrome with history of bradycardia in the past, the medication has been titrated slowly.  Assessment & Plan:   Principal Problem:   Acute encephalopathy Active Problems:   Rheumatoid arthritis ([place])   Acute on chronic renal failure (HCC)   Dementia   UTI (urinary tract infection)   Atrial fibrillation with RVR (HCC)   CVA (cerebral vascular accident) ([place])   Left ventricular systolic dysfunction   Acute post-hemorrhagic anemia   Heme positive stool   Lower GI bleed   Atrial fibrillation with RVR ([place]) -Finding this admission -Initially required Cardizem drip, now on metoprolol. -Cardiology continues to follow. Patient not responding to beta blocker. Discuss with cardiology today with recommendations to transition to by mouth Cardizem. -Not candidate for cardioversion per cardiology -CHA2DS2VASc of 6. Initially on heparin GTT,NOT  Anticoagulation candidate currently given recent anemia, GI bleeding and recurrent falls. -2-D echo repeated with EF of 40-45% and diffuse hypokinesis   Respiratory  distress -Chest x-ray reviewed. Findings of bilateral effusions -No significant change following Lasix given yesterday. -Continue O2 as needed, wean as tolerated  UTI -Treated with IV Rocephin, urine  cultures with no growth  Acute metabolic encephalopathy -Appears pleasantly confused today -Suspect underlying dementia made worse with UTI and acute illness - MRI brain with chronic multiple lacunar infarcts -Seems stable at present  GI bleed - GI consult appreciated, this is most likely lower GI bleed. - 2 units PRBC transfusion 11/17 -Repeat CBC in the morning  Acute on chronic kidney injury stage III -Suspect secondary to dehydration and UTI.  -Back to baseline on IV hydration  Hyperkalemia -Resolved -Repeat basic metabolic panel  New Systolic CHF -Noted for low EF of 40-45%, Likely due to rapid A. fib. On beta blockers, not ACE I/ARB candidate given poor renal function. -Clinically euvolemic at present -will follow daily weights and strict intake and output  -Repeat B met in morning  Elevated troponin -Chronic and in the setting of A. Fib with RVR.  -Asymptomatic at present -Cardiology following  Old  CVA (cerebral vascular accident) ([place]) -chronic  left occipital infarct seen on head CT.  -MRI brain with evidence of chronic multiple lacunar infarcts -continue risk factors modifications   Dementia -Continue Namenda. -Appears stable at present  Rheumatoid arthritis -was On Celebrex prior to admission -NSAIDs on hold secondary to renal insufficiency  Anemia - Acute on chronic iron deficiency, worsened most likely due to GI bleed - Hold heparin GTT, last used units PRBC 11/17 given hemoglobin of 6.8, -Repeat CBC in the morning  Acute on chronic sinusitis - Patient on Rocephin,  changed to  11/18 to Augmentin  -Stable at present  Essential hypertension  -continue hydralazine -Metoprolol discontinued, Cardizem started  per above  Poor IV  access and limitation to blood draw. -Discussed options for IV access with daughter and she prefers having a PICC line or midline for blood draws.   End-of-life -Family at present at bedside. Have expressed interest in discussing case with hospice. -Have consulted hospice, appreciate input  DVT prophylaxis: SCDs Code Status: Full code Family Communication: Patient in room, family at bedside Disposition Plan: Uncertain at this time  Consultants:  Cardiology  Hospice  Procedures:     Antimicrobials: Anti-infectives    Start     Dose/Rate Route Frequency Ordered Stop   03/13/16 1000  amoxicillin-clavulanate (AUGMENTIN) 500-125 MG per tablet 500 mg     1 tablet Oral Every 12 hours 03/13/16 0718     03/11/16 1200  amoxicillin-clavulanate (AUGMENTIN) 875-125 MG per tablet 1 tablet  Status:  Discontinued     1 tablet Oral Every 12 hours 03/11/16 1125 03/13/16 0717   03/08/16 1700  cefTRIAXone (ROCEPHIN) 1 g in dextrose 5 % 50 mL IVPB  Status:  Discontinued     1 g 100 mL/hr over 30 Minutes Intravenous Every 24 hours 03/07/16 2052 03/11/16 1125   03/07/16 1645  cefTRIAXone (ROCEPHIN) 1 g in dextrose 5 % 50 mL IVPB     1 g 100 mL/hr over 30 Minutes Intravenous  Once 03/07/16 1638 03/07/16 1741       Subjective: No complaints  Objective: Vitals:   03/14/16 1351 03/14/16 2237 03/15/16 0532 03/15/16 1518  BP: (!) 127/105 (!) 121/92 (!) 127/104 (!) 108/94  Pulse: (!) 129 (!) 135 (!) 112 (!) 103  Resp: 20 (!) 22 (!) 22 20  Temp: 98.6 F (37 C) 98 F (36.7 C) 98.8 F (37.1 C) 97.9 F (36.6 C)  TempSrc: Oral Oral Oral Oral  SpO2: 97% 98% 96% 96%  Weight:      Height:        Intake/Output Summary (Last 24 hours) at 03/15/16 1832 Last data filed at 03/15/16 1500  Gross per 24 hour  Intake              120 ml  Output                0 ml  Net              120 ml   Filed Weights   03/07/16 2050  Weight: 74.3 kg (163 lb 12.8 oz)    Examination:  General exam: Appears  calm and comfortable  Respiratory system: Clear to auscultation. Respiratory effort normal. Cardiovascular system: S1 & S2 heard, Irregularly irregular Gastrointestinal system: Abdomen is nondistended, soft and nontender. No organomegaly or masses felt. Normal bowel sounds heard. Central nervous system: Alert and oriented. No focal neurological deficits. Extremities: Symmetric 5 x 5 power. Skin: No rashes, lesions Psychiatry: Mildly confused, Mood & affect appropriate.   Data Reviewed: I have personally reviewed following labs and imaging studies  CBC:  Recent Labs Lab 03/10/16 0347  03/11/16 1200 03/12/16 0357 03/13/16 0515 03/14/16 0416 03/15/16 0440  WBC 8.9  --   --  9.1 8.9 9.0 8.3  HGB 6.8*  < > 9.8* 9.2* 9.2* 9.2* 8.8*  HCT 20.5*  < > 30.1* 28.3* 28.5* 28.8* 27.9*  MCV 96.2  --   --  93.4 95.3 96.3 95.9  PLT 227  --   --  188 193 186 188  < > = values in this interval not displayed. Basic Metabolic Panel:  Recent Labs Lab 03/10/16 0347  03/12/16 0357 03/13/16 0515 03/14/16 0416 03/15/16 0440  NA 142 142 144 143 143  K 4.9 4.9 4.7 4.8 4.8  CL 117* 120* 120* 120* 118*  CO2 20* 18* 18* 18* 20*  GLUCOSE 143* 110* 98 101* 101*  BUN 42* 36* 27* 28* 26*  CREATININE 2.12* 1.86* 1.84* 1.86* 2.06*  CALCIUM 8.1* 8.2* 8.3* 8.3* 8.3*   GFR: Estimated Creatinine Clearance: 19.3 mL/min (by C-G formula based on SCr of 2.06 mg/dL (H)). Liver Function Tests: No results for input(s): AST, ALT, ALKPHOS, BILITOT, PROT, ALBUMIN in the last 168 hours. No results for input(s): LIPASE, AMYLASE in the last 168 hours. No results for input(s): AMMONIA in the last 168 hours. Coagulation Profile: No results for input(s): INR, PROTIME in the last 168 hours. Cardiac Enzymes: No results for input(s): CKTOTAL, CKMB, CKMBINDEX, TROPONINI in the last 168 hours. BNP (last 3 results) No results for input(s): PROBNP in the last 8760 hours. HbA1C: No results for input(s): HGBA1C in the last  72 hours. CBG: No results for input(s): GLUCAP in the last 168 hours. Lipid Profile: No results for input(s): CHOL, HDL, LDLCALC, TRIG, CHOLHDL, LDLDIRECT in the last 72 hours. Thyroid Function Tests: No results for input(s): TSH, T4TOTAL, FREET4, T3FREE, THYROIDAB in the last 72 hours. Anemia Panel: No results for input(s): VITAMINB12, FOLATE, FERRITIN, TIBC, IRON, RETICCTPCT in the last 72 hours. Sepsis Labs: No results for input(s): PROCALCITON, LATICACIDVEN in the last 168 hours.  Recent Results (from the past 240 hour(s))  Blood culture (routine x 2)     Status: None   Collection Time: 03/07/16  3:36 PM  Result Value Ref Range Status   Specimen Description BLOOD RIGHT ANTECUBITAL  Final   Special Requests BOTTLES DRAWN AEROBIC AND ANAEROBIC 5 CC  Final   Culture   Final    NO GROWTH 5 DAYS Performed at [facility]    Report Status 03/12/2016 FINAL  Final  Blood culture (routine x 2)     Status: None   Collection Time: 03/07/16  3:36 PM  Result Value Ref Range Status   Specimen Description BLOOD LEFT ANTECUBITAL  Final   Special Requests BOTTLES DRAWN AEROBIC ONLY 6 CC  Final   Culture   Final    NO GROWTH 5 DAYS Performed at [facility]    Report Status 03/12/2016 FINAL  Final  Urine culture     Status: None   Collection Time: 03/07/16  3:51 PM  Result Value Ref Range Status   Specimen Description URINE, CATHETERIZED  Final   Special Requests NONE  Final   Culture NO GROWTH Performed at [facility]   Final   Report Status 03/08/2016 FINAL  Final  MRSA PCR Screening     Status: None   Collection Time: 03/07/16  8:58 PM  Result Value Ref Range Status   MRSA by PCR NEGATIVE NEGATIVE Final    Comment:        The GeneXpert MRSA Assay (FDA approved for NASAL specimens only), is one component of a comprehensive MRSA colonization surveillance program. It is not intended to diagnose MRSA infection nor to guide or monitor treatment for MRSA  infections.      Radiology Studies: Dg Chest Port 1 View  Result Date: 03/15/2016 CLINICAL DATA:  Shortness of Breath EXAM: PORTABLE CHEST 1 VIEW COMPARISON:  03/14/2016 FINDINGS: Cardiac shadow remains enlarged. Aortic calcifications are again seen. Bilateral pleural effusions are again noted. A right-sided PICC line is seen in  satisfactory position. No new focal abnormality is noted. IMPRESSION: No significant change from the prior exam. Small bilateral pleural effusions are seen. Electronically Signed   By: Inez Catalina M.D.   On: 03/15/2016 07:45   Dg Chest Port 1 View  Result Date: 03/14/2016 CLINICAL DATA:  Cough, congestion. EXAM: PORTABLE CHEST 1 VIEW COMPARISON:  Radiograph of March 08, 2016. FINDINGS: Stable cardiomegaly. Atherosclerosis of thoracic aorta is noted. Right-sided PICC line is unchanged in position. No pneumothorax is noted. Increased bibasilar opacities are noted concerning for edema or atelectasis with associated pleural effusions. Bony thorax is unremarkable. IMPRESSION: Aortic atherosclerosis. Increased bibasilar edema or atelectasis with associated pleural effusions. Electronically Signed   By: Marijo Conception, M.D.   On: 03/14/2016 13:51    Scheduled Meds: . amoxicillin-clavulanate  1 tablet Oral Q12H  . B-complex with vitamin C  1 tablet Oral Daily  . cholecalciferol  5,000 Units Oral Daily  . diltiazem  30 mg Oral Q6H  . feeding supplement (ENSURE ENLIVE)  237 mL Oral BID BM  . ferrous sulfate  325 mg Oral QPC breakfast  . folic acid  1 mg Oral Daily  . gabapentin  100 mg Oral QPM  . megestrol  400 mg Oral QPM  . memantine  28 mg Oral QPM  . multivitamin with minerals  1 tablet Oral Daily  . oxybutynin  5 mg Oral BID  . pantoprazole  40 mg Oral Daily  . polyvinyl alcohol  1 drop Both Eyes BID  . senna  1-2 tablet Oral QHS  . sodium chloride  500 mL Intravenous Once  . sodium chloride flush  10-40 mL Intracatheter Q12H  . vitamin C  1,000 mg Oral  Daily   Continuous Infusions:   LOS: 8 days   Tasharra Nodine, Orpah Melter, MD [facility] Pager [phone]  If 7PM-7AM, please contact night-coverage [email or web address] Password [facility] 03/15/2016, 6:32 PM

## 2016-03-15 NOTE — Consult Note (Signed)
Consultation Note Date: 03/15/2016   Patient Name: Darlene Daniels  DOB: [birth year]  MRN: [id number]  Age / Sex: 82 y.o., female  PCP: Willey Blade, MD Referring Physician: Donne Hazel, MD  Reason for Consultation: Establishing goals of care  HPI/Patient Profile: 82 y.o. female  with past medical history of hypertension, recurrent syncope, tachybrady syndrome, chronic kidney disease stage III, rheumatoid arthritis, orthostatic hypertension, dementia, urinary incontinence admitted on 03/07/2016 with encephalopathy, UTI, new onset A. fib , and elevated troponins. She was placed on heparin drip and subsequently developed GI bleed. Her heart rate remains hard to control and plan to transition to diltiazem today. Palliative consulted for goals of care.   Clinical Assessment and Goals of Care: I met today with patient's daughter.  She reports the most important things to her mother are her family and faith.  She states that the physicians have been doing a good job explaining things to her.  She understands that her mother has multiple comorbid conditions that are not going to improve (including dementia), and her goal is to continue to help her mother live as well as possible for as long as possible.    She has been living at home with the assistance of hired caregivers, and family has been looking at potential placement [facility]  She reports that her father also had Alzheimer's disease and was on hospice care prior to his death. She wishes that he had been enrolled with hospice earlier than he was as they provided good care to him as reached the end of his life.  She reports that she would like to discuss options for hospice care with her mother when she is discharged. She does specifically want to know about the potential for getting IV fluid and/or antibiotics (including IV  Rocephin) for urinary tract infection when she is home with hospice support. I told her that these would be things that I would not anticipate hospice covering, but it would be decided by individual hospice. She stated that she would like to meet with hospice and discuss what they would and would not cover.  We also discussed CODE STATUS. She has stated that she does not want her mother to receive care there is not in line with helping her to preserve quality of life. When I discussed with her regarding CODE STATUS in conjunction with this thought, she reports that she wants to maintain full code at this time. She states "I'll be around to determine what to do and what not to do when the time comes." Discussed with her again that the goal of any medical treatment focus on what is most important to her mother and if her goal is for her mother not to receive overly aggressive care, this is something that we should continue to address.   She states that her major concern is if her mother has DO NOT RESUSCITATE order, she will not receive adequate care for potentially reversible conditions. I discussed with this is not  the intention with DO NOT RESUSCITATE and she reports that she understands this but also feels that this is something that may happen. We discussed potential for completion of a MOST form as this would specifically outline what type of care she would like for her mother to receive.  She states that she will consider but does not want to change from FULL CODE at this time   [place] - Daughter is interested in talking with hospice prior to her mother's discharge.  She states that she will make a decision regarding enrolling with hospice support on discharge after she has a chance to discuss specific concerns with agency.  Code Status/Advance Care Planning:  Full code  Palliative Prophylaxis:   Delirium Protocol and Frequent Pain  Assessment  Psycho-social/Spiritual:   Desire for further Chaplaincy support:no  Additional Recommendations: Education on Hospice  Prognosis:   < 6 months  Discharge Planning: To Be Determined. Daughter discussed home versus home with hospice versus assisted living facility with hospice      Primary Diagnoses: Present on Admission: . UTI (urinary tract infection) . Acute on chronic renal failure ([place]) . Dementia . Rheumatoid arthritis ([place])   I have reviewed the medical record, interviewed the patient and family, and examined the patient. The following aspects are pertinent.  Past Medical History:  Diagnosis Date  . Abnormality of gait   . Arthritis   . Memory loss   . Other persistent mental disorders due to conditions classified elsewhere   . Palpitations   . Paroxysmal tachycardia, unspecified   . Rheumatoid arthritis ([place])   . Tachycardia   . Urinary incontinence    Social History   Social History  . Marital status: Married    Spouse name: N/A  . Number of children: N/A  . Years of education: N/A   Social History Main Topics  . Smoking status: Never Smoker  . Smokeless tobacco: Never Used  . Alcohol use No  . Drug use: No  . Sexual activity: Not Asked   Other Topics Concern  . None   Social History Narrative   Patient has a college education   Family History  Problem Relation Age of Onset  . Breast cancer Daughter   . Diabetes Daughter    Scheduled Meds: . amoxicillin-clavulanate  1 tablet Oral Q12H  . B-complex with vitamin C  1 tablet Oral Daily  . cholecalciferol  5,000 Units Oral Daily  . diltiazem  30 mg Oral Q6H  . feeding supplement (ENSURE ENLIVE)  237 mL Oral BID BM  . ferrous sulfate  325 mg Oral QPC breakfast  . folic acid  1 mg Oral Daily  . gabapentin  100 mg Oral QPM  . megestrol  400 mg Oral QPM  . memantine  28 mg Oral QPM  . multivitamin with minerals  1 tablet Oral Daily  . oxybutynin  5 mg Oral BID  . pantoprazole  40  mg Oral Daily  . polyvinyl alcohol  1 drop Both Eyes BID  . senna  1-2 tablet Oral QHS  . sodium chloride  500 mL Intravenous Once  . sodium chloride flush  10-40 mL Intracatheter Q12H  . vitamin C  1,000 mg Oral Daily   Continuous Infusions: PRN Meds:.acetaminophen **OR** acetaminophen, sodium chloride flush Medications Prior to Admission:  Prior to Admission medications   Medication Sig Start Date End Date Taking? Authorizing Provider  Adalimumab (HUMIRA) 40 MG/0.8ML PSKT Inject 40 mg  into the skin every 14 (fourteen) days.   Yes Historical Provider, MD  Ascorbic Acid (VITAMIN C) 1000 MG tablet Take 1,000 mg by mouth daily.   Yes Historical Provider, MD  aspirin EC 81 MG tablet Take 81 mg by mouth daily.   Yes Historical Provider, MD  b complex vitamins tablet Take 1 tablet by mouth daily.   Yes Historical Provider, MD  celecoxib (CELEBREX) 200 MG capsule Take 200 mg by mouth 2 (two) times daily.    Yes Historical Provider, MD  Cholecalciferol (VITAMIN D3) 5000 units TABS Take 5,000 Units by mouth daily.    Yes Historical Provider, MD  ferrous sulfate 325 (65 FE) MG tablet Take 325 mg by mouth daily.   Yes Historical Provider, MD  folic acid (FOLVITE) 229 MCG tablet Take 800 mcg by mouth daily.    Yes Historical Provider, MD  gabapentin (NEURONTIN) 100 MG capsule Take 100 mg by mouth every evening.   Yes Historical Provider, MD  hydrALAZINE (APRESOLINE) 25 MG tablet Take 25 mg by mouth 2 (two) times daily. 12/13/15  Yes Historical Provider, MD  megestrol (MEGACE) 40 MG/ML suspension Take 400 mg by mouth every evening.   Yes Historical Provider, MD  Memantine HCl ER (NAMENDA XR) 28 MG CP24 Take 28 mg by mouth daily. Patient taking differently: Take 28 mg by mouth every evening.  08/05/13  Yes Marcial Pacas, MD  metoprolol tartrate (LOPRESSOR) 25 MG tablet Take 37.5 mg by mouth daily.   Yes Historical Provider, MD  Multiple Vitamin (MULTIVITAMIN) capsule Take 1 capsule by mouth daily.   Yes  Historical Provider, MD  OVER THE COUNTER MEDICATION Take 80 mg by mouth daily. Zinc '80mg'$    Yes Historical Provider, MD  oxybutynin (DITROPAN) 5 MG tablet Take 5 mg by mouth 2 (two) times daily.    Yes Historical Provider, MD  polyvinyl alcohol (LIQUIFILM TEARS) 1.4 % ophthalmic solution Place 1 drop into both eyes 2 (two) times daily.   Yes Historical Provider, MD  senna (SENOKOT) 8.6 MG TABS tablet Take 1-2 tablets (8.6-17.2 mg total) by mouth at bedtime. 01/04/16  Yes Debbe Odea, MD  bisacodyl (BISACODYL) 5 MG EC tablet Take 1-2 tablets (5-10 mg total) by mouth daily as needed for moderate constipation. Patient not taking: Reported on 03/07/2016 01/04/16   Debbe Odea, MD   Allergies  Allergen Reactions  . Sulfa Antibiotics Other (See Comments)    Unknown reaction    Review of Systems  Unable to obtain due to mental status  Physical Exam  General: Lying in bed with eyes closed. Opens eyes, but does not really participate in conversation, in no acute distress.  HEENT: No bruits, no goiter, no JVD Heart: Irregular. No murmur appreciated. Lungs: Good air movement, clear Abdomen: Soft, nontender, nondistended, positive bowel sounds.  Ext: No significant edema Skin: Warm and dry Neuro: Sleepy but arousable, does not really follow commands   Vital Signs: BP (!) 127/104 (BP Location: Left Arm)   Pulse (!) 112   Temp 98.8 F (37.1 C) (Oral)   Resp (!) 22   Ht '5\' 9"'$  (1.753 m)   Wt 74.3 kg (163 lb 12.8 oz)   SpO2 96%   BMI 24.19 kg/m  Pain Assessment: PAINAD POSS *See Group Information*: S-Acceptable,Sleep, easy to arouse Pain Score: 0-No pain   SpO2: SpO2: 96 % O2 Device:SpO2: 96 % O2 Flow Rate: .O2 Flow Rate (L/min): 2 L/min  IO: Intake/output summary:  Intake/Output Summary (Last 24 hours) at 03/15/16  [place] filed at 03/14/16 1539  Gross per 24 hour  Intake              120 ml  Output                0 ml  Net              120 ml    LBM: Last BM Date:  03/12/16 Baseline Weight: Weight: 74.3 kg (163 lb 12.8 oz) Most recent weight: Weight: 74.3 kg (163 lb 12.8 oz)     Palliative Assessment/Data:     Time In: 0950 Time Out: 1050 Time Total: 60 Greater than 50%  of this time was spent counseling and coordinating care related to the above assessment and plan.  Signed by: Micheline Rough, MD   Please contact Palliative Medicine Team phone at [phone] for questions and concerns.  For individual provider: See Shea Evans

## 2016-03-16 DIAGNOSIS — F0390 Unspecified dementia without behavioral disturbance: Secondary | ICD-10-CM

## 2016-03-16 LAB — CBC
HCT: 27 % — ABNORMAL LOW (ref 36.0–46.0)
Hemoglobin: 8.7 g/dL — ABNORMAL LOW (ref 12.0–15.0)
MCH: 30.1 pg (ref 26.0–34.0)
MCHC: 32.2 g/dL (ref 30.0–36.0)
MCV: 93.4 fL (ref 78.0–100.0)
Platelets: 174 10*3/uL (ref 150–400)
RBC: 2.89 MIL/uL — AB (ref 3.87–5.11)
RDW: 18.1 % — AB (ref 11.5–15.5)
WBC: 8.6 10*3/uL (ref 4.0–10.5)

## 2016-03-16 LAB — BASIC METABOLIC PANEL
Anion gap: 6 (ref 5–15)
BUN: 29 mg/dL — AB (ref 6–20)
CALCIUM: 8.4 mg/dL — AB (ref 8.9–10.3)
CO2: 19 mmol/L — AB (ref 22–32)
CREATININE: 2.02 mg/dL — AB (ref 0.44–1.00)
Chloride: 118 mmol/L — ABNORMAL HIGH (ref 101–111)
GFR calc non Af Amer: 21 mL/min — ABNORMAL LOW (ref >60)
GFR, EST AFRICAN AMERICAN: 24 mL/min — AB (ref >60)
GLUCOSE: 105 mg/dL — AB (ref 65–99)
Potassium: 4.6 mmol/L (ref 3.5–5.1)
Sodium: 143 mmol/L (ref 135–145)

## 2016-03-16 MED ORDER — DILTIAZEM HCL 60 MG PO TABS
60.0000 mg | ORAL_TABLET | Freq: Four times a day (QID) | ORAL | Status: DC
  Administered 2016-03-16 – 2016-03-20 (×17): 60 mg via ORAL
  Filled 2016-03-16 (×17): qty 1

## 2016-03-16 NOTE — Progress Notes (Signed)
PROGRESS NOTE    Darlene Daniels  AYT:[id number] DOB: 10-Oct-1924 DOA: 03/07/2016 PCP: Alva Garnet., MD    Brief Narrative:  82 year old female with history of hypertension, recurrent syncope, tachybrady syndrome, chronic kidney disease stage III, rheumatoid arthritis, orthostatic hypertension, dementia, urinary incontinence , Presents to ED with encephalopathy, secondary to UTI, as well workup significant for new onset A. fib , and elevated troponins , Head CT showing subacute/chronic left occipital infarct (new from CT since February 2017) but MRI showing chronic infarct, Seen by cardiology for A. fib management, initially on heparin GTT which has been stopped secondary to GI bleed.required  PRBC transfusion, no recurrence of GI bleed after holding anticoagulation. - Patient in A. fib with RVR, heart rates very hard to control , especially with known history of tachybradycardia syndrome with history of bradycardia in the past, the medication has been titrated slowly.  Assessment & Plan:   Principal Problem:   Acute encephalopathy Active Problems:   Rheumatoid arthritis (HCC)   Acute on chronic renal failure (HCC)   Dementia   UTI (urinary tract infection)   Atrial fibrillation with RVR (HCC)   CVA (cerebral vascular accident) (HCC)   Left ventricular systolic dysfunction   Acute post-hemorrhagic anemia   Heme positive stool   Lower GI bleed   Altered mental status   Atrial fibrillation with RVR (HCC) -Finding this admission -Cardiology is following -Not candidate for cardioversion per cardiology -CHA2DS2VASc of 6. Initially on heparin GTT,NOT  Anticoagulation candidate currently given recent anemia, GI bleeding and recurrent falls. -2-D echo repeated with EF of 40-45% and diffuse hypokinesis -Patient did not respond well to metoprolol recently. Regimen was changed to Cardizem per cardiology. Overnight, patient's heart rate remained in 120s to 130s. Will increase dose of  Cardizem to 60 mg from 30 mg every 6 hours  Respiratory distress -Continue O2 as needed, wean as tolerated -Today, patient appears to be breathing somewhat better. -Continue to hold off on Lasix for now given rising creatinine.  UTI -Treated with IV Rocephin, urine  cultures with no growth -Stable at present  Acute metabolic encephalopathy -Appears somewhat drowsy today -Possible underlying dementia is made worse by recent UTI and acute illness - MRI brain with chronic multiple lacunar infarcts  GI bleed - GI consult appreciated, this is most likely lower GI bleed. - 2 units PRBC transfusion 11/17 -Stable at present. Will repeat CBC in the morning  Acute on chronic kidney injury stage III -Suspect secondary to dehydration and UTI.  -Creatinine remains above 2 this morning. Labs reviewed -We'll repeat basic metabolic panel morning  Hyperkalemia -Resolved -Follow basic metabolic panel  New Systolic CHF -Noted for low EF of 40-45%, Likely due to rapid A. fib. On beta blockers, not ACE I/ARB candidate given poor renal function. -Appears euvolemic currently  Elevated troponin -Chronic and in the setting of A. Fib with RVR.  -Cardiology is following -Patient currently remains asymptomatic  Old  CVA (cerebral vascular accident) (HCC) -chronic  left occipital infarct seen on head CT.  -MRI brain with evidence of chronic multiple lacunar infarcts -continue risk factors modifications   Dementia -Continue Namenda. -Mental status appears stable  Rheumatoid arthritis -was On Celebrex prior to admission -NSAIDs remain on hold secondary to renal insufficiency  Anemia - Acute on chronic iron deficiency, worsened most likely due to GI bleed - Hold heparin GTT, last used units PRBC 11/17 given hemoglobin of 6.8, -Hemoglobin 8.7 today, stable from yesterday  Acute on chronic sinusitis - Patient on  Rocephin, changed to  11/18 to Augmentin  -Stable at  present  Essential hypertension  -continue hydralazine -Blood pressure stable. Continue Cardizem as per above  Poor IV access and limitation to blood draw. -PICC line placed   End-of-life -Appreciate input by palliative care. Recommendations noted. Family is interested in hospice as outpatient  DVT prophylaxis: SCDs Code Status: Full code Family Communication: Patient in room, family at bedside Disposition Plan: Uncertain at this time  Consultants:  Cardiology  Hospice  Procedures:     Antimicrobials: Anti-infectives    Start     Dose/Rate Route Frequency Ordered Stop   03/13/16 1000  amoxicillin-clavulanate (AUGMENTIN) 500-125 MG per tablet 500 mg     1 tablet Oral Every 12 hours 03/13/16 0718     03/11/16 1200  amoxicillin-clavulanate (AUGMENTIN) 875-125 MG per tablet 1 tablet  Status:  Discontinued     1 tablet Oral Every 12 hours 03/11/16 1125 03/13/16 0717   03/08/16 1700  cefTRIAXone (ROCEPHIN) 1 g in dextrose 5 % 50 mL IVPB  Status:  Discontinued     1 g 100 mL/hr over 30 Minutes Intravenous Every 24 hours 03/07/16 2052 03/11/16 1125   03/07/16 1645  cefTRIAXone (ROCEPHIN) 1 g in dextrose 5 % 50 mL IVPB     1 g 100 mL/hr over 30 Minutes Intravenous  Once 03/07/16 1638 03/07/16 1741      Subjective: Without complaints this morning  Objective: Vitals:   03/15/16 1518 03/15/16 2043 03/16/16 0509 03/16/16 1431  BP: (!) 108/94 (!) 124/102 (!) 121/102 119/89  Pulse: (!) 103 (!) 106 (!) 140 (!) 104  Resp: 20 20 20 20   Temp: 97.9 F (36.6 C) 98.9 F (37.2 C) 98.7 F (37.1 C) 98.5 F (36.9 C)  TempSrc: Oral Oral Oral Axillary  SpO2: 96% 97% 98% 100%  Weight:      Height:        Intake/Output Summary (Last 24 hours) at 03/16/16 1828 Last data filed at 03/16/16 1526  Gross per 24 hour  Intake              140 ml  Output                2 ml  Net              138 ml   Filed Weights   03/07/16 2050  Weight: 74.3 kg (163 lb 12.8 oz)     Examination:  General exam: Lying in bed, no acute distress Respiratory system: Normal respiratory effort, no audible wheezing Cardiovascular system: Tachycardic, S1-S2 Gastrointestinal system: Positive bowel sounds, nondistended Central nervous system: CN II through XII grossly intact, sensation intact Extremities: Perfused, no clubbing Skin: Normal skin turgor, no notable skin lesions seen Psychiatry: Somewhat drowsy, mildly confused, no visual hallucinations.   Data Reviewed: I have personally reviewed following labs and imaging studies  CBC:  Recent Labs Lab 03/12/16 0357 03/13/16 0515 03/14/16 0416 03/15/16 0440 03/16/16 0452  WBC 9.1 8.9 9.0 8.3 8.6  HGB 9.2* 9.2* 9.2* 8.8* 8.7*  HCT 28.3* 28.5* 28.8* 27.9* 27.0*  MCV 93.4 95.3 96.3 95.9 93.4  PLT 188 193 186 188 174   Basic Metabolic Panel:  Recent Labs Lab 03/12/16 0357 03/13/16 0515 03/14/16 0416 03/15/16 0440 03/16/16 0452  NA 142 144 143 143 143  K 4.9 4.7 4.8 4.8 4.6  CL 120* 120* 120* 118* 118*  CO2 18* 18* 18* 20* 19*  GLUCOSE 110* 98 101* 101* 105*  BUN  36* 27* 28* 26* 29*  CREATININE 1.86* 1.84* 1.86* 2.06* 2.02*  CALCIUM 8.2* 8.3* 8.3* 8.3* 8.4*   GFR: Estimated Creatinine Clearance: 19.7 mL/min (by C-G formula based on SCr of 2.02 mg/dL (H)). Liver Function Tests: No results for input(s): AST, ALT, ALKPHOS, BILITOT, PROT, ALBUMIN in the last 168 hours. No results for input(s): LIPASE, AMYLASE in the last 168 hours. No results for input(s): AMMONIA in the last 168 hours. Coagulation Profile: No results for input(s): INR, PROTIME in the last 168 hours. Cardiac Enzymes: No results for input(s): CKTOTAL, CKMB, CKMBINDEX, TROPONINI in the last 168 hours. BNP (last 3 results) No results for input(s): PROBNP in the last 8760 hours. HbA1C: No results for input(s): HGBA1C in the last 72 hours. CBG: No results for input(s): GLUCAP in the last 168 hours. Lipid Profile: No results for  input(s): CHOL, HDL, LDLCALC, TRIG, CHOLHDL, LDLDIRECT in the last 72 hours. Thyroid Function Tests: No results for input(s): TSH, T4TOTAL, FREET4, T3FREE, THYROIDAB in the last 72 hours. Anemia Panel: No results for input(s): VITAMINB12, FOLATE, FERRITIN, TIBC, IRON, RETICCTPCT in the last 72 hours. Sepsis Labs: No results for input(s): PROCALCITON, LATICACIDVEN in the last 168 hours.  Recent Results (from the past 240 hour(s))  Blood culture (routine x 2)     Status: None   Collection Time: 03/07/16  3:36 PM  Result Value Ref Range Status   Specimen Description BLOOD RIGHT ANTECUBITAL  Final   Special Requests BOTTLES DRAWN AEROBIC AND ANAEROBIC 5 CC  Final   Culture   Final    NO GROWTH 5 DAYS Performed at [facility]    Report Status 03/12/2016 FINAL  Final  Blood culture (routine x 2)     Status: None   Collection Time: 03/07/16  3:36 PM  Result Value Ref Range Status   Specimen Description BLOOD LEFT ANTECUBITAL  Final   Special Requests BOTTLES DRAWN AEROBIC ONLY 6 CC  Final   Culture   Final    NO GROWTH 5 DAYS Performed at [facility]    Report Status 03/12/2016 FINAL  Final  Urine culture     Status: None   Collection Time: 03/07/16  3:51 PM  Result Value Ref Range Status   Specimen Description URINE, CATHETERIZED  Final   Special Requests NONE  Final   Culture NO GROWTH Performed at [facility]   Final   Report Status 03/08/2016 FINAL  Final  MRSA PCR Screening     Status: None   Collection Time: 03/07/16  8:58 PM  Result Value Ref Range Status   MRSA by PCR NEGATIVE NEGATIVE Final    Comment:        The GeneXpert MRSA Assay (FDA approved for NASAL specimens only), is one component of a comprehensive MRSA colonization surveillance program. It is not intended to diagnose MRSA infection nor to guide or monitor treatment for MRSA infections.      Radiology Studies: Dg Chest Port 1 View  Result Date: 03/15/2016 CLINICAL DATA:   Shortness of Breath EXAM: PORTABLE CHEST 1 VIEW COMPARISON:  03/14/2016 FINDINGS: Cardiac shadow remains enlarged. Aortic calcifications are again seen. Bilateral pleural effusions are again noted. A right-sided PICC line is seen in satisfactory position. No new focal abnormality is noted. IMPRESSION: No significant change from the prior exam. Small bilateral pleural effusions are seen. Electronically Signed   By: Alcide Clever M.D.   On: 03/15/2016 07:45    Scheduled Meds: . amoxicillin-clavulanate  1  tablet Oral Q12H  . B-complex with vitamin C  1 tablet Oral Daily  . cholecalciferol  5,000 Units Oral Daily  . diltiazem  60 mg Oral Q6H  . feeding supplement (ENSURE ENLIVE)  237 mL Oral BID BM  . ferrous sulfate  325 mg Oral QPC breakfast  . folic acid  1 mg Oral Daily  . gabapentin  100 mg Oral QPM  . megestrol  400 mg Oral QPM  . memantine  28 mg Oral QPM  . multivitamin with minerals  1 tablet Oral Daily  . oxybutynin  5 mg Oral BID  . pantoprazole  40 mg Oral Daily  . polyvinyl alcohol  1 drop Both Eyes BID  . senna  1-2 tablet Oral QHS  . sodium chloride  500 mL Intravenous Once  . sodium chloride flush  10-40 mL Intracatheter Q12H  . vitamin C  1,000 mg Oral Daily   Continuous Infusions:   LOS: 9 days   CHIU, Scheryl Marten, MD [facility] Pager [phone]  If 7PM-7AM, please contact night-coverage [email or web address] Password [facility] 03/16/2016, 6:28 PM

## 2016-03-16 NOTE — Progress Notes (Signed)
Daily Progress Note   Patient Name: Darlene Daniels       Date: 03/16/2016 DOB: Aug 21, 1924  Age: 82 y.o. MRN#: [id number] Attending Physician: Donne Hazel, MD Primary Care Physician: Salena Saner., MD Admit Date: 03/07/2016  Reason for Consultation/Follow-up: Establishing goals of care  Subjective: Ms. Mullings is alert and eating breakfast on exam this AM.  She denies complaints and when asked about needs replied, "Money.  Lots of it."    Discussed with her daughter and son in room.  They have met with hospice and plan for enrolling with [facility] on discharge.  Their father received services through [facility] and family reports being very happy with his care.  Length of Stay: 9  Current Medications: Scheduled Meds:  . amoxicillin-clavulanate  1 tablet Oral Q12H  . B-complex with vitamin C  1 tablet Oral Daily  . cholecalciferol  5,000 Units Oral Daily  . diltiazem  30 mg Oral Q6H  . feeding supplement (ENSURE ENLIVE)  237 mL Oral BID BM  . ferrous sulfate  325 mg Oral QPC breakfast  . folic acid  1 mg Oral Daily  . gabapentin  100 mg Oral QPM  . megestrol  400 mg Oral QPM  . memantine  28 mg Oral QPM  . multivitamin with minerals  1 tablet Oral Daily  . oxybutynin  5 mg Oral BID  . pantoprazole  40 mg Oral Daily  . polyvinyl alcohol  1 drop Both Eyes BID  . senna  1-2 tablet Oral QHS  . sodium chloride  500 mL Intravenous Once  . sodium chloride flush  10-40 mL Intracatheter Q12H  . vitamin C  1,000 mg Oral Daily    Continuous Infusions:   PRN Meds: acetaminophen **OR** acetaminophen, sodium chloride flush  Physical Exam         General: Much more alert today, remains unable to participate in discussion of her health care, but much more socially interactive today, in no  acute distress.  HEENT: No bruits, no goiter, no JVD Heart: Irregular. No murmur appreciated. Lungs: Good air movement, clear Abdomen: Soft, nontender, nondistended, positive bowel sounds.  Ext: No significant edema Skin: Warm and dry Neuro: Does not really follow commands, but much more alert and interactive   Vital Signs: BP (!) 121/102 (BP Location: Left Arm)  Pulse (!) 140   Temp 98.7 F (37.1 C) (Oral)   Resp 20   Ht _0  (1.753 m)   Wt 74.3 kg (163 lb 12.8 oz)   SpO2 98%   BMI 24.19 kg/m  SpO2: SpO2: 98 % O2 Device: O2 Device: Not Delivered O2 Flow Rate: O2 Flow Rate (L/min): 2 L/min  Intake/output summary:  Intake/Output Summary (Last 24 hours) at 03/16/16 0957 Last data filed at 03/15/16 1500  Gross per 24 hour  Intake              120 ml  Output                0 ml  Net              120 ml   LBM: Last BM Date: 03/13/16 Baseline Weight: Weight: 74.3 kg (163 lb 12.8 oz) Most recent weight: Weight: 74.3 kg (163 lb 12.8 oz)       Palliative Assessment/Data:    Flowsheet Rows   Flowsheet Row Most Recent Value  Intake Tab  Referral Department  Hospitalist  Unit at Time of Referral  Med/Surg Unit  Palliative Care Primary Diagnosis  Cardiac  Date Notified  03/15/16  Palliative Care Type  New Palliative care  Date of Admission  03/07/16  Date first seen by Palliative Care  03/15/16  # of days Palliative referral response time  0 Day(s)  # of days IP prior to Palliative referral  8  Clinical Assessment  Palliative Performance Scale Score  30%  Pain Max last 24 hours  Not able to report  Pain Min Last 24 hours  Not able to report  Psychosocial & Spiritual Assessment  Palliative Care Outcomes  Patient/Family meeting held?  Yes  Who was at the meeting?  Daughter, Freda Munro  Palliative Care Outcomes  Counseled regarding hospice, ACP counseling assistance      Patient Active Problem List   Diagnosis Date Noted  . Altered mental status   . Lower GI bleed    . Left ventricular systolic dysfunction 09/32/3557  . Acute post-hemorrhagic anemia   . Heme positive stool   . UTI (urinary tract infection) 03/07/2016  . Acute encephalopathy 03/07/2016  . Atrial fibrillation with RVR ([place]) 03/07/2016  . CVA (cerebral vascular accident) ([place]) 03/07/2016  . Dementia 01/04/2016  . Acute on chronic renal failure ([place]) 01/03/2016  . Orthostatic hypotension 05/30/2015  . Macrocytic anemia 05/30/2015  . AKI (acute kidney injury) ([place]) 05/30/2015  . Syncope and collapse 05/30/2015  . Essential hypertension, benign 03/10/2013  . Tachy-brady syndrome ([place]) 03/10/2013    Class: Chronic  . Memory loss   . Palpitations   . Paroxysmal tachycardia, unspecified ([place])   . Arthritis   . Urinary incontinence   . Rheumatoid arthritis ([place])   . Tachycardia   . Encephalopathy 12/27/2012  . Abnormality of gait 12/27/2012    Palliative Care Assessment & Plan   Assessment: 82 y.o. female  with past medical history of hypertension, recurrent syncope, tachybrady syndrome, chronic kidney disease stage III, rheumatoid arthritis, orthostatic hypertension, dementia, urinary incontinence admitted on 03/07/2016 with encephalopathy, UTI, new onset A. fib , and elevated troponins. She was placed on heparin drip and subsequently developed GI bleed. Her heart rate remains hard to control and plan to transition to diltiazem today. Palliative consulted for goals of care.   Recommendations/Plan:  Family would like to continue with current therapy until medically ready for discharge.  When she leaves, her  daughter reports wanting to enroll with [facility] for home hospice services.  They have met with Abigail Butts from hospice and her daughter reports that she will call and leave message at number she was provided.  I also placed another referral to care management to ensure they are aware of family decision to invoke hospice benefit.  Briefly discussed again regarding long term goals as well  as CODE STATUS. No changes today and she remains FULL CODE. This is something that hospice will be able to continue to address and process with family following discharge.  Goals of Care and Additional Recommendations:  Plan for enrollment with hospice through [facility] on discharge  Code Status:    Code Status Orders        Start     Ordered   03/07/16 2053  Full code  Continuous     03/07/16 2052    Code Status History    Date Active Date Inactive Code Status Order ID Comments User Context   01/04/2016 12:09 AM 01/04/2016 10:11 PM Full Code [id number]  Toy Baker, MD Inpatient   05/30/2015  6:29 PM 06/01/2015 11:23 PM Full Code [id number]  Samella Parr, NP Inpatient   05/30/2015  5:53 PM 05/30/2015  6:29 PM Full Code [id number]  Costin Karlyne Greenspan, MD Inpatient    Advance Directive Documentation   Flowsheet Row Most Recent Value  Type of Advance Directive  Living will, Healthcare Power of Attorney  Pre-existing out of facility DNR order (yellow form or pink MOST form)  No data  "MOST" Form in Place?  No data       Prognosis:   < 6 months  Discharge Planning:  Home with Hospice once medically stable  Care plan was discussed with daughter and son  Thank you for allowing the Palliative Medicine Team to assist in the care of this patient.   Time In: 0920 Time Out: 0940 Total Time 20 Prolonged Time Billed No      Greater than 50%  of this time was spent counseling and coordinating care related to the above assessment and plan.  Micheline Rough, MD  Please contact Palliative Medicine Team phone at [phone] for questions and concerns.

## 2016-03-17 DIAGNOSIS — F0150 Vascular dementia without behavioral disturbance: Secondary | ICD-10-CM

## 2016-03-17 LAB — CBC
HEMATOCRIT: 27.8 % — AB (ref 36.0–46.0)
Hemoglobin: 8.9 g/dL — ABNORMAL LOW (ref 12.0–15.0)
MCH: 30.5 pg (ref 26.0–34.0)
MCHC: 32 g/dL (ref 30.0–36.0)
MCV: 95.2 fL (ref 78.0–100.0)
Platelets: 194 10*3/uL (ref 150–400)
RBC: 2.92 MIL/uL — ABNORMAL LOW (ref 3.87–5.11)
RDW: 17.9 % — AB (ref 11.5–15.5)
WBC: 8.3 10*3/uL (ref 4.0–10.5)

## 2016-03-17 LAB — BASIC METABOLIC PANEL
ANION GAP: 4 — AB (ref 5–15)
BUN: 28 mg/dL — AB (ref 6–20)
CALCIUM: 8.4 mg/dL — AB (ref 8.9–10.3)
CO2: 21 mmol/L — ABNORMAL LOW (ref 22–32)
Chloride: 116 mmol/L — ABNORMAL HIGH (ref 101–111)
Creatinine, Ser: 1.86 mg/dL — ABNORMAL HIGH (ref 0.44–1.00)
GFR calc Af Amer: 27 mL/min — ABNORMAL LOW (ref >60)
GFR, EST NON AFRICAN AMERICAN: 23 mL/min — AB (ref >60)
GLUCOSE: 113 mg/dL — AB (ref 65–99)
POTASSIUM: 4.6 mmol/L (ref 3.5–5.1)
SODIUM: 141 mmol/L (ref 135–145)

## 2016-03-17 MED ORDER — METOPROLOL TARTRATE 50 MG PO TABS
50.0000 mg | ORAL_TABLET | Freq: Two times a day (BID) | ORAL | Status: DC
  Administered 2016-03-17 – 2016-03-18 (×3): 50 mg via ORAL
  Filled 2016-03-17 (×3): qty 1

## 2016-03-17 MED ORDER — POLYETHYLENE GLYCOL 3350 17 G PO PACK
17.0000 g | PACK | Freq: Every day | ORAL | Status: DC
  Administered 2016-03-17 – 2016-03-20 (×3): 17 g via ORAL
  Filled 2016-03-17 (×3): qty 1

## 2016-03-17 MED ORDER — MEMANTINE HCL ER 14 MG PO CP24
14.0000 mg | ORAL_CAPSULE | Freq: Every day | ORAL | Status: DC
  Administered 2016-03-17 – 2016-03-19 (×3): 14 mg via ORAL
  Filled 2016-03-17 (×4): qty 1

## 2016-03-17 NOTE — Progress Notes (Signed)
Patient had 10 beat run of V.tach. Asymptomatic upon assessment. NP on call made aware.

## 2016-03-17 NOTE — Progress Notes (Signed)
[facility]  Spoke with patient's daughter, Velna Hatchet, by telephone.  Velna Hatchet states they do want services of [facility] when patient discharges home.  Dois Davenport declined the need for any DME at this time.  Velna Hatchet has been given [facility] 24/7 number, and she will alert [facility] when discharge is planned.  Completed discharge summary will need to be faxed to [facility] at [phone], when final.   Please notify [facility] when patient is ready to leave unit at discharge--call [phone].    Please call with questions.  Thank you, Kristine Garbe, RN, BSN [facility] Liaison

## 2016-03-17 NOTE — Progress Notes (Signed)
Physical Therapy Treatment Patient Details Name: Darlene Daniels MRN: [id number] DOB: Jun 29, 1924 Today's Date: 04-08-2016    History of Present Illness 82 yo female admitted with UTI. Imaging showed a subacute/chronic CVA during this admission. Hx of dementia, incontinence, RA, tachycardia.     PT Comments    Pt continues to require significant assist for bed mobility and unable to tolerate sitting EOB.  Pt fatigues very quickly and does not appear able to tolerate minimal activity, becomes dyspneic (SpO2 97% on room air).  Per chart, plan is for home with 24/7 caregiver and hospice services.   Follow Up Recommendations  Home health PT;Supervision/Assistance - 24 hour     Equipment Recommendations  None recommended by PT    Recommendations for Other Services       Precautions / Restrictions Precautions Precautions: Fall    Mobility  Bed Mobility Overal bed mobility: Needs Assistance Bed Mobility: Supine to Sit;Sit to Supine     Supine to sit: Mod assist;HOB elevated Sit to supine: HOB elevated;Mod assist   General bed mobility comments: assist for upper and lower body, pt able to better assist with LEs, performed twice as pt returning to supine due to fatigue  Transfers                    Ambulation/Gait                 Stairs            Wheelchair Mobility    Modified Rankin (Stroke Patients Only)       Balance Overall balance assessment: Needs assistance Sitting-balance support: Bilateral upper extremity supported;Feet supported Sitting balance-Leahy Scale: Poor Sitting balance - Comments: pt requires UE support and fatigues quickly requiring external support                            Cognition Arousal/Alertness: Awake/alert Behavior During Therapy: WFL for tasks assessed/performed Overall Cognitive Status: History of cognitive impairments - at baseline                      Exercises General Exercises - Lower  Extremity Heel Slides: AAROM;Both;5 reps    General Comments        Pertinent Vitals/Pain Pain Assessment: No/denies pain    Home Living                      Prior Function            PT Goals (current goals can now be found in the care plan section) Progress towards PT goals: Not progressing toward goals - comment;Goals downgraded-see care plan    Frequency    Min 3X/week      PT Plan Current plan remains appropriate    Co-evaluation             End of Session   Activity Tolerance: Patient limited by fatigue Patient left: in bed;with call bell/phone within reach;with bed alarm set;with family/visitor present     Time: 3154-0086 PT Time Calculation (min) (ACUTE ONLY): 15 min  Charges:  $Therapeutic Activity: 8-22 mins                    G Codes:      Cortnee Steinmiller,KATHrine E 08-Apr-2016, 3:59 PM Zenovia Jarred, PT, DPT 04/08/2016 Pager: [phone]

## 2016-03-17 NOTE — Progress Notes (Signed)
PROGRESS NOTE    Darlene Daniels  ZOX:[id number] DOB: 1924/06/20 DOA: 03/07/2016 PCP: Alva Garnet., MD    Brief Narrative:  82 year old female with history of hypertension, recurrent syncope, tachybrady syndrome, chronic kidney disease stage III, rheumatoid arthritis, orthostatic hypertension, dementia, urinary incontinence , Presents to ED with encephalopathy, secondary to UTI, as well workup significant for new onset A. fib , and elevated troponins , Head CT showing subacute/chronic left occipital infarct (new from CT since February 2017) but MRI showing chronic infarct, Seen by cardiology for A. fib management, initially on heparin GTT which has been stopped secondary to GI bleed.required  PRBC transfusion, no recurrence of GI bleed after holding anticoagulation. - Patient in A. fib with RVR, heart rates very hard to control , especially with known history of tachybradycardia syndrome with history of bradycardia in the past, the medication has been titrated slowly.  Assessment & Plan:   Principal Problem:   Acute encephalopathy Active Problems:   Rheumatoid arthritis (HCC)   Acute on chronic renal failure (HCC)   Dementia   UTI (urinary tract infection)   Atrial fibrillation with RVR (HCC)   CVA (cerebral vascular accident) (HCC)   Left ventricular systolic dysfunction   Acute post-hemorrhagic anemia   Heme positive stool   Lower GI bleed   Altered mental status   Atrial fibrillation with RVR (HCC) -New finding this admission -Cardiology continues to follow -Not candidate for cardioversion per cardiology -CHA2DS2VASc of 6. Initially on heparin GTT,NOT  Anticoagulation candidate currently given recent anemia, GI bleeding and recurrent falls. -2-D echo repeated with EF of 40-45% and diffuse hypokinesis -Vital signs reviewed. Patient remains with heart rate into the 130s to 140s. Cartilage recommendations reviewed. Plans for addition of beta blocker. Titrate as  tolerated. -Heart rate reviewed this afternoon. Heart rates ranging from the mid 80s to the upper 90s  Respiratory distress -Patient appears comfortable at this time -Today, patient appears to be breathing somewhat better. -Continue Lasix as needed if shortness of breath worsens  UTI -Treated with IV Rocephin, urine  cultures with no growth -Remained stable this time  Acute metabolic encephalopathy -Patient awake, conversant -Family reports patient seems to be improving  GI bleed - GI consult appreciated, this is most likely lower GI bleed. - 2 units PRBC transfusion 11/17 -Stable at present. Will repeat CBC in the morning  Acute on chronic kidney injury stage III -Suspect secondary to dehydration and UTI.  -Creatinine appears to be improving slightly -Repeat basic metabolic panel in the morning  Hyperkalemia -Potassium levels normalized -Repeat basic metabolic panel morning  New Systolic CHF -Noted for low EF of 40-45%, Likely due to rapid A. fib. On beta blockers, not ACE I/ARB candidate given poor renal function. -Appears to be euvolemic at present  Elevated troponin -Chronic and in the setting of A. Fib with RVR.  -Remains asymptomatic time  Old  CVA (cerebral vascular accident) (HCC) -chronic  left occipital infarct seen on head CT.  -MRI brain with evidence of chronic multiple lacunar infarcts -continue risk factors modifications   Dementia -Continue Namenda. -Mental status appears stable  Rheumatoid arthritis -was On Celebrex prior to admission -NSAIDs remain on hold secondary to renal insufficiency  Anemia - Acute on chronic iron deficiency, worsened most likely due to GI bleed - Hold heparin GTT, last used units PRBC 11/17 given hemoglobin of 6.8, -Hemoglobin reviewed. Currently 8.9. Stable  Acute on chronic sinusitis - Patient on Rocephin, changed to  11/18 to Augmentin  -Completed course of  antibiotics. Augmentin has since been  discontinued  Essential hypertension  -continue hydralazine -Blood pressure remains stable.  -Patient on Cardizem with addition of beta blocker per above  Poor IV access and limitation to blood draw. -PICC line placed   End-of-life -Appreciate input by palliative care. Recommendations noted. Family is interested in hospice as outpatient  DVT prophylaxis: SCDs Code Status: Full code Family Communication: Patient in room, family at bedside Disposition Plan: Uncertain at this time  Consultants:  Cardiology  Hospice  Procedures:     Antimicrobials: Anti-infectives    Start     Dose/Rate Route Frequency Ordered Stop   03/13/16 1000  amoxicillin-clavulanate (AUGMENTIN) 500-125 MG per tablet 500 mg  Status:  Discontinued     1 tablet Oral Every 12 hours 03/13/16 0718 03/17/16 0758   03/11/16 1200  amoxicillin-clavulanate (AUGMENTIN) 875-125 MG per tablet 1 tablet  Status:  Discontinued     1 tablet Oral Every 12 hours 03/11/16 1125 03/13/16 0717   03/08/16 1700  cefTRIAXone (ROCEPHIN) 1 g in dextrose 5 % 50 mL IVPB  Status:  Discontinued     1 g 100 mL/hr over 30 Minutes Intravenous Every 24 hours 03/07/16 2052 03/11/16 1125   03/07/16 1645  cefTRIAXone (ROCEPHIN) 1 g in dextrose 5 % 50 mL IVPB     1 g 100 mL/hr over 30 Minutes Intravenous  Once 03/07/16 1638 03/07/16 1741      Subjective: Patient pleasant this morning without complaints  Objective: Vitals:   03/17/16 0642 03/17/16 0652 03/17/16 1200 03/17/16 1408  BP: (!) 131/96  (!) 137/102 110/86  Pulse: (!) 145 (!) 116 (!) 135 91  Resp: (!) 24  16 16   Temp: 98.3 F (36.8 C)  98.2 F (36.8 C) 98.9 F (37.2 C)  TempSrc: Oral  Oral Oral  SpO2: 91%  96% 96%  Weight:      Height:       No intake or output data in the 24 hours ending 03/17/16 1836 Filed Weights   03/07/16 2050  Weight: 74.3 kg (163 lb 12.8 oz)    Examination:  General exam: Conversant, awake, no acute distress Respiratory system: Normal  chest rise, clear to auscultation Cardiovascular system: Irregularly irregular, S1-S2 Gastrointestinal system: Nontender, no masses palpable Central nervous system: No seizures, no tremors Extremities: No joint deformities, without clubbing Skin: No rashes, no pallor Psychiatry: Mood appears normal, no auditory hallucinations  Data Reviewed: I have personally reviewed following labs and imaging studies  CBC:  Recent Labs Lab 03/13/16 0515 03/14/16 0416 03/15/16 0440 03/16/16 0452 03/17/16 0435  WBC 8.9 9.0 8.3 8.6 8.3  HGB 9.2* 9.2* 8.8* 8.7* 8.9*  HCT 28.5* 28.8* 27.9* 27.0* 27.8*  MCV 95.3 96.3 95.9 93.4 95.2  PLT 193 186 188 174 194   Basic Metabolic Panel:  Recent Labs Lab 03/13/16 0515 03/14/16 0416 03/15/16 0440 03/16/16 0452 03/17/16 0435  NA 144 143 143 143 141  K 4.7 4.8 4.8 4.6 4.6  CL 120* 120* 118* 118* 116*  CO2 18* 18* 20* 19* 21*  GLUCOSE 98 101* 101* 105* 113*  BUN 27* 28* 26* 29* 28*  CREATININE 1.84* 1.86* 2.06* 2.02* 1.86*  CALCIUM 8.3* 8.3* 8.3* 8.4* 8.4*   GFR: Estimated Creatinine Clearance: 21.4 mL/min (by C-G formula based on SCr of 1.86 mg/dL (H)). Liver Function Tests: No results for input(s): AST, ALT, ALKPHOS, BILITOT, PROT, ALBUMIN in the last 168 hours. No results for input(s): LIPASE, AMYLASE in the last 168 hours. No  results for input(s): AMMONIA in the last 168 hours. Coagulation Profile: No results for input(s): INR, PROTIME in the last 168 hours. Cardiac Enzymes: No results for input(s): CKTOTAL, CKMB, CKMBINDEX, TROPONINI in the last 168 hours. BNP (last 3 results) No results for input(s): PROBNP in the last 8760 hours. HbA1C: No results for input(s): HGBA1C in the last 72 hours. CBG: No results for input(s): GLUCAP in the last 168 hours. Lipid Profile: No results for input(s): CHOL, HDL, LDLCALC, TRIG, CHOLHDL, LDLDIRECT in the last 72 hours. Thyroid Function Tests: No results for input(s): TSH, T4TOTAL, FREET4,  T3FREE, THYROIDAB in the last 72 hours. Anemia Panel: No results for input(s): VITAMINB12, FOLATE, FERRITIN, TIBC, IRON, RETICCTPCT in the last 72 hours. Sepsis Labs: No results for input(s): PROCALCITON, LATICACIDVEN in the last 168 hours.  Recent Results (from the past 240 hour(s))  MRSA PCR Screening     Status: None   Collection Time: 03/07/16  8:58 PM  Result Value Ref Range Status   MRSA by PCR NEGATIVE NEGATIVE Final    Comment:        The GeneXpert MRSA Assay (FDA approved for NASAL specimens only), is one component of a comprehensive MRSA colonization surveillance program. It is not intended to diagnose MRSA infection nor to guide or monitor treatment for MRSA infections.      Radiology Studies: No results found.  Scheduled Meds: . B-complex with vitamin C  1 tablet Oral Daily  . cholecalciferol  5,000 Units Oral Daily  . diltiazem  60 mg Oral Q6H  . feeding supplement (ENSURE ENLIVE)  237 mL Oral BID BM  . ferrous sulfate  325 mg Oral QPC breakfast  . folic acid  1 mg Oral Daily  . gabapentin  100 mg Oral QPM  . megestrol  400 mg Oral QPM  . memantine  14 mg Oral Daily  . metoprolol tartrate  50 mg Oral BID  . multivitamin with minerals  1 tablet Oral Daily  . oxybutynin  5 mg Oral BID  . pantoprazole  40 mg Oral Daily  . polyethylene glycol  17 g Oral Daily  . polyvinyl alcohol  1 drop Both Eyes BID  . senna  1-2 tablet Oral QHS  . sodium chloride  500 mL Intravenous Once  . sodium chloride flush  10-40 mL Intracatheter Q12H  . vitamin C  1,000 mg Oral Daily   Continuous Infusions:   LOS: 10 days   Cherisa Brucker, Scheryl Marten, MD [facility] Pager [phone]  If 7PM-7AM, please contact night-coverage [email or web address] Password [facility] 03/17/2016, 6:36 PM

## 2016-03-17 NOTE — Progress Notes (Signed)
NO CHARGE NOTE  I checked in on Ms. Ide briefly this afternoon.  Caregiver but no family present.  She denied needs at this time.  Her daughter is clear in wish to continue current therapies and FULL CODE status while here in the hospital.  She does want her mother to enroll in hospice at home through [facility] on discharge.  Will continue to follow while inpatient and progress conversation as family emotionally able.  Romie Minus, MD [facility] Palliative Medicine Team [phone]

## 2016-03-17 NOTE — Progress Notes (Signed)
Spoke with Kristine Garbe, RN, [facility]. Pt's daughter selected [facility] for home at discharge.

## 2016-03-17 NOTE — Progress Notes (Signed)
Patient Name: Darlene Daniels Date of Encounter: 03/17/2016  Primary Cardiologist: Dr [facility] Problem List     Principal Problem:   Acute encephalopathy Active Problems:   Rheumatoid arthritis (HCC)   Acute on chronic renal failure (HCC)   Dementia   UTI (urinary tract infection)   Atrial fibrillation with RVR (HCC)   CVA (cerebral vascular accident) (HCC)   Left ventricular systolic dysfunction   Acute post-hemorrhagic anemia   Heme positive stool   Lower GI bleed   Altered mental status     Subjective   Pt breathing a little fast today, per dtr, no real change. Pt has not complaints  Inpatient Medications    Scheduled Meds: . B-complex with vitamin C  1 tablet Oral Daily  . cholecalciferol  5,000 Units Oral Daily  . diltiazem  60 mg Oral Q6H  . feeding supplement (ENSURE ENLIVE)  237 mL Oral BID BM  . ferrous sulfate  325 mg Oral QPC breakfast  . folic acid  1 mg Oral Daily  . gabapentin  100 mg Oral QPM  . megestrol  400 mg Oral QPM  . memantine  14 mg Oral Daily  . multivitamin with minerals  1 tablet Oral Daily  . oxybutynin  5 mg Oral BID  . pantoprazole  40 mg Oral Daily  . polyvinyl alcohol  1 drop Both Eyes BID  . senna  1-2 tablet Oral QHS  . sodium chloride  500 mL Intravenous Once  . sodium chloride flush  10-40 mL Intracatheter Q12H  . vitamin C  1,000 mg Oral Daily   Continuous Infusions:  PRN Meds: acetaminophen **OR** acetaminophen, sodium chloride flush   Vital Signs    Vitals:   03/16/16 1431 03/16/16 2128 03/17/16 0642 03/17/16 0652  BP: 119/89 116/81 (!) 131/96   Pulse: (!) 104 (!) 108 (!) 145 (!) 116  Resp: 20 20 (!) 24   Temp: 98.5 F (36.9 C) 98.7 F (37.1 C) 98.3 F (36.8 C)   TempSrc: Axillary Oral Oral   SpO2: 100% 97% 91%   Weight:      Height:        Intake/Output Summary (Last 24 hours) at 03/17/16 1132 Last data filed at 03/16/16 1526  Gross per 24 hour  Intake               20 ml  Output                 1 ml  Net               19 ml   Filed Weights   03/07/16 2050  Weight: 163 lb 12.8 oz (74.3 kg)    Physical Exam    GEN: Well nourished, well developed, in no acute distress.  HEENT: Grossly normal.  Neck: Supple, no JVD seen, carotid bruits, or masses. Cardiac: Rapid and irreg R&R, no murmurs, rubs, or gallops. No clubbing, cyanosis, edema.  Radials/DP/PT 2+ and equal bilaterally.  Respiratory:  Respirations regular and unlabored, decreased BS bilateral bases with some rales. GI: Soft, nontender, nondistended, BS + x 4. MS: no deformity or atrophy. Skin: warm and dry, no rash. Neuro:  Strength and sensation are intact. Psych: AAOx3.  Normal affect.  Labs    CBC  Recent Labs  03/16/16 0452 03/17/16 0435  WBC 8.6 8.3  HGB 8.7* 8.9*  HCT 27.0* 27.8*  MCV 93.4 95.2  PLT 174 194   Basic Metabolic Panel  Recent  Labs  03/16/16 0452 03/17/16 0435  NA 143 141  K 4.6 4.6  CL 118* 116*  CO2 19* 21*  GLUCOSE 105* 113*  BUN 29* 28*  CREATININE 2.02* 1.86*  CALCIUM 8.4* 8.4*   Telemetry    Atrial fib, RVR, Rate increased this am - Personally Reviewed  ECG    N/a - Personally Reviewed  Radiology    No results found.  Cardiac Studies   None new  Patient Profile     18F (HAD A BIRTHDAY THIS ADMISSION) with hypertension, recurrent syncope, tachycardia bradycardia syndrome (PSVT), chronic kidney disease stage III, rheumatoid arthritis, orthostatic hypertension, dementia, urinary incontinence was brought to the ED 11/14 by her daughter with increased confusion and lethargy for past 1 week, associated with poor appetite - found to be in A fib with RVR. Also with heme+ stools/worsening anemia, subacute/chronic stroke, UTI, AKI on CKD (Cr 3.6 on adm), hyperkalemia. Has history of tachy-brady syndrome per Dr. Michaelle Copas notes in 2014. No further details on this are available. She had 3 hospital encounters for syncope in 2017 - Feb 2017 (felt due to orthostasis), April 2017  (felt due to orthostasis), and September 2017 (presumed vasovagal). Family reports HR baseline around 50 at times, but also 70s-80s per daughter's log.  Assessment & Plan    1. Atrial fib RVR (suspected paroxysmal), being managed in the context of history of tachy-brady syndrome - difficult situation. HR remains elevated in the 130s-140s range despite being on diltiazem. She is not a candidate for pacemaker given her advanced dementia and not a candidate for cardioversion as she cannot be anticoagulated due to her GIB.  - Dr Allyson Sabal told pt dtr (and POA): I am not convinced we will be able to make significant headway with regards to her HR, especially without running the risk of eventually running into problems with bradycardia. She comprehends this well. I think the above changes to meds are worthwhile given her increased RR, but ultimately Ms. Grose is showing evidence of multiorgan failure with worsening renal function, encephalopathy, GIB and elevated troponin. I told the daughter we are seeing evidence of body dysfunctions that may not be reversible. The patient's daughter is very familiar with hospice care as she cared for her father who passed away after a battle with Alzheimer's. She has been seen by palliative care, but is still a Full Code. Ms. Mcneely' daughter's goal is for her mother to have comfortable quality of life. - as HR elevated this am, will restart metoprolol at 50 mg bid - follow HR/BP and ok to increase rx as needed to maintain HR approx 110.  - we will see again prn.  2. LV dysfunction by echo 03/08/16 - reduced LVEF 40-45% may be due to AF RVR. Even though CCB less ideal with LV dysfunction, this may be tachy-induced so it's worth trying. No ACEI/ARB due to renal failure.  3. Elevated troponin - no clear trend and not consistent with ACS; pt not a candidate for aggressive cardiac eval.   4. Medical issues above with acute encephalopathy, subacute/chronic stroke, UTI, AKI on  CKD, GIB/ABL anemia - per IM/GI. Per GI, suspect NSAID induced gastropathy on Celebrex, continuing conservative management.   5. HTN - labile, follow.  Melida Quitter, PA-C  03/17/2016, 11:32 AM

## 2016-03-18 DIAGNOSIS — I272 Pulmonary hypertension, unspecified: Secondary | ICD-10-CM

## 2016-03-18 DIAGNOSIS — I351 Nonrheumatic aortic (valve) insufficiency: Secondary | ICD-10-CM

## 2016-03-18 DIAGNOSIS — Z515 Encounter for palliative care: Secondary | ICD-10-CM

## 2016-03-18 DIAGNOSIS — F0390 Unspecified dementia without behavioral disturbance: Secondary | ICD-10-CM

## 2016-03-18 DIAGNOSIS — Z7189 Other specified counseling: Secondary | ICD-10-CM

## 2016-03-18 LAB — BASIC METABOLIC PANEL
Anion gap: 6 (ref 5–15)
BUN: 26 mg/dL — AB (ref 6–20)
CHLORIDE: 114 mmol/L — AB (ref 101–111)
CO2: 22 mmol/L (ref 22–32)
CREATININE: 1.8 mg/dL — AB (ref 0.44–1.00)
Calcium: 8.6 mg/dL — ABNORMAL LOW (ref 8.9–10.3)
GFR calc Af Amer: 28 mL/min — ABNORMAL LOW (ref >60)
GFR calc non Af Amer: 24 mL/min — ABNORMAL LOW (ref >60)
GLUCOSE: 104 mg/dL — AB (ref 65–99)
POTASSIUM: 4.8 mmol/L (ref 3.5–5.1)
SODIUM: 142 mmol/L (ref 135–145)

## 2016-03-18 MED ORDER — METOPROLOL TARTRATE 50 MG PO TABS
50.0000 mg | ORAL_TABLET | Freq: Three times a day (TID) | ORAL | Status: DC
  Administered 2016-03-18 – 2016-03-19 (×3): 50 mg via ORAL
  Filled 2016-03-18 (×3): qty 1

## 2016-03-18 NOTE — Progress Notes (Signed)
Daily Progress Note   Patient Name: Darlene Daniels       Date: 03/18/2016 DOB: May 04, 1924  Age: 82 y.o. MRN#: [id number] Attending Physician: Darlene Hazel, MD Primary Care Physician: Darlene Saner., MD Admit Date: 03/07/2016  Reason for Consultation/Follow-up: Establishing goals of care  Subjective: Met with family in conjunction with Dr. Wyline Daniels this AM.  We discussed her heart rate as well the fact that cardiology has been working very hard to control her rate and the possibility that it may never be something that is fully controlled.  She is very concerned about being able to monitor this at home.  Discussed that at this point, how her mother is feeling is likely a better indicator of how interventions are working than focus on the actual numbers.    Length of Stay: 11  Current Medications: Scheduled Meds:  . B-complex with vitamin C  1 tablet Oral Daily  . cholecalciferol  5,000 Units Oral Daily  . diltiazem  60 mg Oral Q6H  . feeding supplement (ENSURE ENLIVE)  237 mL Oral BID BM  . ferrous sulfate  325 mg Oral QPC breakfast  . folic acid  1 mg Oral Daily  . gabapentin  100 mg Oral QPM  . megestrol  400 mg Oral QPM  . memantine  14 mg Oral Daily  . metoprolol tartrate  50 mg Oral TID  . multivitamin with minerals  1 tablet Oral Daily  . oxybutynin  5 mg Oral BID  . pantoprazole  40 mg Oral Daily  . polyethylene glycol  17 g Oral Daily  . polyvinyl alcohol  1 drop Both Eyes BID  . senna  1-2 tablet Oral QHS  . sodium chloride  500 mL Intravenous Once  . sodium chloride flush  10-40 mL Intracatheter Q12H  . vitamin C  1,000 mg Oral Daily    Continuous Infusions:   PRN Meds: acetaminophen **OR** acetaminophen, sodium chloride flush  Physical Exam         General:  Sleeping during exam in no acute distress.  HEENT: No bruits, no goiter, no JVD Heart: Irregular. Lungs: Regular, no distress Neuro: Sleeping   Vital Signs: BP 134/86   Pulse (!) 126   Temp 98 F (36.7 C) (Oral)   Resp 20   Ht _0  (1.753 m)  Wt 74.3 kg (163 lb 12.8 oz)   SpO2 93%   BMI 24.19 kg/m  SpO2: SpO2: 93 % O2 Device: O2 Device: Not Delivered O2 Flow Rate: O2 Flow Rate (L/min): 2 L/min  Intake/output summary:   Intake/Output Summary (Last 24 hours) at 03/18/16 1218 Last data filed at 03/18/16 2841  Gross per 24 hour  Intake               20 ml  Output                0 ml  Net               20 ml   LBM: Last BM Date: 03/13/16 Baseline Weight: Weight: 74.3 kg (163 lb 12.8 oz) Most recent weight: Weight: 74.3 kg (163 lb 12.8 oz)       Palliative Assessment/Data:    Flowsheet Rows   Flowsheet Row Most Recent Value  Intake Tab  Referral Department  Hospitalist  Unit at Time of Referral  Med/Surg Unit  Palliative Care Primary Diagnosis  Cardiac  Date Notified  03/15/16  Palliative Care Type  New Palliative care  Date of Admission  03/07/16  Date first seen by Palliative Care  03/15/16  # of days Palliative referral response time  0 Day(s)  # of days IP prior to Palliative referral  8  Clinical Assessment  Palliative Performance Scale Score  30%  Pain Max last 24 hours  Not able to report  Pain Min Last 24 hours  Not able to report  Psychosocial & Spiritual Assessment  Palliative Care Outcomes  Patient/Family meeting held?  Yes  Who was at the meeting?  Daughter, Darlene Daniels  Palliative Care Outcomes  Counseled regarding hospice, ACP counseling assistance      Patient Active Problem List   Diagnosis Date Noted  . Altered mental status   . Lower GI bleed   . Left ventricular systolic dysfunction 32/44/0102  . Acute post-hemorrhagic anemia   . Heme positive stool   . UTI (urinary tract infection) 03/07/2016  . Acute encephalopathy 03/07/2016  .  Atrial fibrillation with RVR ([place]) 03/07/2016  . CVA (cerebral vascular accident) ([place]) 03/07/2016  . Dementia 01/04/2016  . Acute on chronic renal failure ([place]) 01/03/2016  . Orthostatic hypotension 05/30/2015  . Macrocytic anemia 05/30/2015  . AKI (acute kidney injury) ([place]) 05/30/2015  . Syncope and collapse 05/30/2015  . Essential hypertension, benign 03/10/2013  . Tachy-brady syndrome ([place]) 03/10/2013    Class: Chronic  . Memory loss   . Palpitations   . Paroxysmal tachycardia, unspecified ([place])   . Arthritis   . Urinary incontinence   . Rheumatoid arthritis ([place])   . Tachycardia   . Encephalopathy 12/27/2012  . Abnormality of gait 12/27/2012    Palliative Care Assessment & Plan   Assessment: 82 y.o. female  with past medical history of hypertension, recurrent syncope, tachybrady syndrome, chronic kidney disease stage III, rheumatoid arthritis, orthostatic hypertension, dementia, urinary incontinence admitted on 03/07/2016 with encephalopathy, UTI, new onset A. fib , and elevated troponins. She was placed on heparin drip and subsequently developed GI bleed. Her heart rate remains hard to control and plan to transition to diltiazem today. Palliative consulted for goals of care.   Recommendations/Plan:  Family would like to continue with current therapy until medically ready for discharge.  In talking with her daughter, she has reported that her main goal is to have good quality of life for as long as possible.  At the same time, her daughter remains invested in pursuing any interventions that may be offered.  I think that this may be a combination of limited insight to how sick her mother is as well as emotional difficulty processing the fact she is approaching the end of her life.  Plan is for discharge with Hospice (who cared for patient's husband) and I think they will be able to continue to work through these issues more effectively once patient is in her home environment.  No  changes today and she remains FULL CODE. This is something that hospice will be able to continue to address and process with family following discharge.  Goals of Care and Additional Recommendations:  Plan for enrollment with hospice through [facility] on discharge  Code Status:    Code Status Orders        Start     Ordered   03/07/16 2053  Full code  Continuous     03/07/16 2052    Code Status History    Date Active Date Inactive Code Status Order ID Comments User Context   01/04/2016 12:09 AM 01/04/2016 10:11 PM Full Code [id number]  Toy Baker, MD Inpatient   05/30/2015  6:29 PM 06/01/2015 11:23 PM Full Code [id number]  Samella Parr, NP Inpatient   05/30/2015  5:53 PM 05/30/2015  6:29 PM Full Code [id number]  Costin Karlyne Greenspan, MD Inpatient    Advance Directive Documentation   Flowsheet Row Most Recent Value  Type of Advance Directive  Living will, Healthcare Power of Attorney  Pre-existing out of facility DNR order (yellow form or pink MOST form)  No data  "MOST" Form in Place?  No data       Prognosis:   < 6 months  Discharge Planning:  Home with Hospice once medically stable  Care plan was discussed with daughter and son  Thank you for allowing the Palliative Medicine Team to assist in the care of this patient.   Time In: 1010 Time Out: 1030 Total Time 20 Prolonged Time Billed No      Greater than 50%  of this time was spent counseling and coordinating care related to the above assessment and plan.  Micheline Rough, MD  Please contact Palliative Medicine Team phone at [phone] for questions and concerns.

## 2016-03-18 NOTE — Progress Notes (Signed)
PROGRESS NOTE    Darlene Daniels  ZDG:[id number] DOB: 01-26-1925 DOA: 03/07/2016 PCP: Alva Garnet., MD    Brief Narrative:  82 year old female with history of hypertension, recurrent syncope, tachybrady syndrome, chronic kidney disease stage III, rheumatoid arthritis, orthostatic hypertension, dementia, urinary incontinence , Presents to ED with encephalopathy, secondary to UTI, as well workup significant for new onset A. fib , and elevated troponins , Head CT showing subacute/chronic left occipital infarct (new from CT since February 2017) but MRI showing chronic infarct, Seen by cardiology for A. fib management, initially on heparin GTT which has been stopped secondary to GI bleed.required  PRBC transfusion, no recurrence of GI bleed after holding anticoagulation. - Patient in A. fib with RVR, heart rates very hard to control , especially with known history of tachybradycardia syndrome with history of bradycardia in the past, the medication has been titrated slowly.  Assessment & Plan:   Principal Problem:   Acute encephalopathy Active Problems:   Rheumatoid arthritis (HCC)   Acute on chronic renal failure (HCC)   Dementia   UTI (urinary tract infection)   Atrial fibrillation with RVR (HCC)   CVA (cerebral vascular accident) (HCC)   Left ventricular systolic dysfunction   Acute post-hemorrhagic anemia   Heme positive stool   Lower GI bleed   Altered mental status   Dementia without behavioral disturbance   Goals of care, counseling/discussion   Palliative care encounter   Atrial fibrillation with RVR (HCC) -New finding this admission -Cardiology continues to follow -Not candidate for cardioversion per cardiology -CHA2DS2VASc of 6. Initially on heparin GTT,NOT  Anticoagulation candidate currently given recent anemia, GI bleeding and recurrent falls. -2-D echo repeated with EF of 40-45% and diffuse hypokinesis -This morning, heart rate noted to be in the 120 to 130  range -Discussed case with cardiology, recommendations to increase dose of metoprolol  Respiratory distress -Patient appears comfortable at this time -Continue O2 as needed  UTI -Treated with IV Rocephin, urine  cultures with no growth -Complete a course of antibiotics  Acute metabolic encephalopathy -Seems to have improved this admission -Patient awake and conversant  GI bleed - GI consult appreciated, this is most likely lower GI bleed. - 2 units PRBC transfusion 11/17 -Remains stable -Given GI bleeding, poor candidate for prophylactic anticoagulation  Acute on chronic kidney injury stage III -Suspect secondary to dehydration and UTI.  -Creatinine improving slowly -We'll repeat renal function in the morning  Hyperkalemia -Potassium normalized  New Systolic CHF -Noted for low EF of 40-45%, Likely due to rapid A. fib. On beta blockers, not ACE I/ARB candidate given poor renal function. -Remains stable and euvolemic  Elevated troponin -Chronic and in the setting of A. Fib with RVR.  -Currently is symptomatically. Denying chest pain present  Old  CVA (cerebral vascular accident) (HCC) -chronic  left occipital infarct seen on head CT.  -MRI brain with evidence of chronic multiple lacunar infarcts -continue risk factors modifications   Dementia -Continue Namenda. -Mental status appears stable  Rheumatoid arthritis -was On Celebrex prior to admission -NSAIDs remain on hold secondary to renal insufficiency  Anemia - Acute on chronic iron deficiency, worsened most likely due to GI bleed - Hold heparin GTT, last used units PRBC 11/17 given hemoglobin of 6.8, -Hemoglobin reviewed. Currently 8.9. Stable  Acute on chronic sinusitis - Patient on Rocephin, changed to  11/18 to Augmentin  -Completed course of antibiotics. Augmentin has since been discontinued  Essential hypertension  -continue hydralazine -Blood pressure remains stable.  -Patient on  Cardizem with addition of beta blocker per above  Poor IV access and limitation to blood draw. -PICC line placed   End-of-life -Appreciate input by palliative care. Recommendations noted. Family is interested in hospice as outpatient  DVT prophylaxis: SCDs Code Status: Full code Family Communication: Patient in room, family at bedside Disposition Plan: Uncertain at this time  Consultants:  Cardiology  Hospice  Procedures:     Antimicrobials: Anti-infectives    Start     Dose/Rate Route Frequency Ordered Stop   03/13/16 1000  amoxicillin-clavulanate (AUGMENTIN) 500-125 MG per tablet 500 mg  Status:  Discontinued     1 tablet Oral Every 12 hours 03/13/16 0718 03/17/16 0758   03/11/16 1200  amoxicillin-clavulanate (AUGMENTIN) 875-125 MG per tablet 1 tablet  Status:  Discontinued     1 tablet Oral Every 12 hours 03/11/16 1125 03/13/16 0717   03/08/16 1700  cefTRIAXone (ROCEPHIN) 1 g in dextrose 5 % 50 mL IVPB  Status:  Discontinued     1 g 100 mL/hr over 30 Minutes Intravenous Every 24 hours 03/07/16 2052 03/11/16 1125   03/07/16 1645  cefTRIAXone (ROCEPHIN) 1 g in dextrose 5 % 50 mL IVPB     1 g 100 mL/hr over 30 Minutes Intravenous  Once 03/07/16 1638 03/07/16 1741      Subjective: Patient without complaints or concerns at this time  Objective: Vitals:   03/17/16 2201 03/18/16 0629 03/18/16 0937 03/18/16 1501  BP: 118/81 (!) 144/98 134/86 110/71  Pulse: (!) 104 (!) 117 (!) 126 84  Resp: (!) 22 20  20   Temp: 97.3 F (36.3 C) 98 F (36.7 C)  98 F (36.7 C)  TempSrc: Oral Oral  Oral  SpO2: 94% 94% 93% 100%  Weight:      Height:        Intake/Output Summary (Last 24 hours) at 03/18/16 1741 Last data filed at 03/18/16 1030  Gross per 24 hour  Intake              260 ml  Output                0 ml  Net              260 ml   Filed Weights   03/07/16 2050  Weight: 74.3 kg (163 lb 12.8 oz)    Examination:  General exam: Awake, lying in bed, no acute  distress Respiratory system: Normal respiratory effort, no audible wheezing Cardiovascular system: Irregular rhythm, S1-S2 Gastrointestinal system: Nondistended, positive bowel sounds Central nervous system: CN II through XII grossly intact, sensation intact Extremities: Perfused, no cyanosis Skin: Normal skin turgor, no notable skin lesions seen Psychiatry: Affect normal, no visual hallucinations  Data Reviewed: I have personally reviewed following labs and imaging studies  CBC:  Recent Labs Lab 03/13/16 0515 03/14/16 0416 03/15/16 0440 03/16/16 0452 03/17/16 0435  WBC 8.9 9.0 8.3 8.6 8.3  HGB 9.2* 9.2* 8.8* 8.7* 8.9*  HCT 28.5* 28.8* 27.9* 27.0* 27.8*  MCV 95.3 96.3 95.9 93.4 95.2  PLT 193 186 188 174 194   Basic Metabolic Panel:  Recent Labs Lab 03/14/16 0416 03/15/16 0440 03/16/16 0452 03/17/16 0435 03/18/16 0527  NA 143 143 143 141 142  K 4.8 4.8 4.6 4.6 4.8  CL 120* 118* 118* 116* 114*  CO2 18* 20* 19* 21* 22  GLUCOSE 101* 101* 105* 113* 104*  BUN 28* 26* 29* 28* 26*  CREATININE 1.86* 2.06* 2.02* 1.86* 1.80*  CALCIUM 8.3* 8.3*  8.4* 8.4* 8.6*   GFR: Estimated Creatinine Clearance: 22.1 mL/min (by C-G formula based on SCr of 1.8 mg/dL (H)). Liver Function Tests: No results for input(s): AST, ALT, ALKPHOS, BILITOT, PROT, ALBUMIN in the last 168 hours. No results for input(s): LIPASE, AMYLASE in the last 168 hours. No results for input(s): AMMONIA in the last 168 hours. Coagulation Profile: No results for input(s): INR, PROTIME in the last 168 hours. Cardiac Enzymes: No results for input(s): CKTOTAL, CKMB, CKMBINDEX, TROPONINI in the last 168 hours. BNP (last 3 results) No results for input(s): PROBNP in the last 8760 hours. HbA1C: No results for input(s): HGBA1C in the last 72 hours. CBG: No results for input(s): GLUCAP in the last 168 hours. Lipid Profile: No results for input(s): CHOL, HDL, LDLCALC, TRIG, CHOLHDL, LDLDIRECT in the last 72  hours. Thyroid Function Tests: No results for input(s): TSH, T4TOTAL, FREET4, T3FREE, THYROIDAB in the last 72 hours. Anemia Panel: No results for input(s): VITAMINB12, FOLATE, FERRITIN, TIBC, IRON, RETICCTPCT in the last 72 hours. Sepsis Labs: No results for input(s): PROCALCITON, LATICACIDVEN in the last 168 hours.  No results found for this or any previous visit (from the past 240 hour(s)).   Radiology Studies: No results found.  Scheduled Meds: . B-complex with vitamin C  1 tablet Oral Daily  . cholecalciferol  5,000 Units Oral Daily  . diltiazem  60 mg Oral Q6H  . feeding supplement (ENSURE ENLIVE)  237 mL Oral BID BM  . ferrous sulfate  325 mg Oral QPC breakfast  . folic acid  1 mg Oral Daily  . gabapentin  100 mg Oral QPM  . megestrol  400 mg Oral QPM  . memantine  14 mg Oral Daily  . metoprolol tartrate  50 mg Oral TID  . multivitamin with minerals  1 tablet Oral Daily  . oxybutynin  5 mg Oral BID  . pantoprazole  40 mg Oral Daily  . polyethylene glycol  17 g Oral Daily  . polyvinyl alcohol  1 drop Both Eyes BID  . senna  1-2 tablet Oral QHS  . sodium chloride  500 mL Intravenous Once  . sodium chloride flush  10-40 mL Intracatheter Q12H  . vitamin C  1,000 mg Oral Daily   Continuous Infusions:   LOS: 11 days   CHIU, Scheryl Marten, MD [facility] Pager [phone]  If 7PM-7AM, please contact night-coverage [email or web address] Password [facility] 03/18/2016, 5:41 PM

## 2016-03-18 NOTE — Progress Notes (Addendum)
Pt. Refused all oral night time meds. Family present in the room, requesting to give meds between 6a-8p d/t drowsiness. Pt. Daughter also requested miralaax to be D/C'd.

## 2016-03-18 NOTE — Progress Notes (Signed)
Patient Name: Damon Baisch Date of Encounter: 03/18/2016  Primary Cardiologist: [facility] Problem List     Principal Problem:   Acute encephalopathy Active Problems:   Rheumatoid arthritis (HCC)   Acute on chronic renal failure (HCC)   Dementia   UTI (urinary tract infection)   Atrial fibrillation with RVR (HCC)   CVA (cerebral vascular accident) (HCC)   Left ventricular systolic dysfunction   Acute post-hemorrhagic anemia   Heme positive stool   Lower GI bleed   Altered mental status     Subjective   Disoriented, calm and pleasant  Inpatient Medications    Scheduled Meds: . B-complex with vitamin C  1 tablet Oral Daily  . cholecalciferol  5,000 Units Oral Daily  . diltiazem  60 mg Oral Q6H  . feeding supplement (ENSURE ENLIVE)  237 mL Oral BID BM  . ferrous sulfate  325 mg Oral QPC breakfast  . folic acid  1 mg Oral Daily  . gabapentin  100 mg Oral QPM  . megestrol  400 mg Oral QPM  . memantine  14 mg Oral Daily  . metoprolol tartrate  50 mg Oral BID  . multivitamin with minerals  1 tablet Oral Daily  . oxybutynin  5 mg Oral BID  . pantoprazole  40 mg Oral Daily  . polyethylene glycol  17 g Oral Daily  . polyvinyl alcohol  1 drop Both Eyes BID  . senna  1-2 tablet Oral QHS  . sodium chloride  500 mL Intravenous Once  . sodium chloride flush  10-40 mL Intracatheter Q12H  . vitamin C  1,000 mg Oral Daily   Continuous Infusions:  PRN Meds: acetaminophen **OR** acetaminophen, sodium chloride flush   Vital Signs    Vitals:   03/17/16 1200 03/17/16 1408 03/17/16 2201 03/18/16 0629  BP: (!) 137/102 110/86 118/81 (!) 144/98  Pulse: (!) 135 91 (!) 104 (!) 117  Resp: 16 16 (!) 22 20  Temp: 98.2 F (36.8 C) 98.9 F (37.2 C) 97.3 F (36.3 C) 98 F (36.7 C)  TempSrc: Oral Oral Oral Oral  SpO2: 96% 96% 94% 94%  Weight:      Height:        Intake/Output Summary (Last 24 hours) at 03/18/16 0910 Last data filed at 03/18/16 0524  Gross per 24 hour   Intake               20 ml  Output                0 ml  Net               20 ml   Filed Weights   03/07/16 2050  Weight: 163 lb 12.8 oz (74.3 kg)    Physical Exam   Disoriented. GEN: Well nourished, well developed, in no acute distress.  HEENT: Grossly normal.  Neck: Supple, no JVD, carotid bruits, or masses. Cardiac: irregular, no murmurs, rubs, or gallops. No clubbing, cyanosis, edema.  Radials/DP/PT 2+ and equal bilaterally.  Respiratory:  Respirations regular and unlabored, clear to auscultation bilaterally. GI: Soft, nontender, nondistended, BS + x 4. MS: no deformity or atrophy. Skin: warm and dry, no rash. Neuro:  Strength and sensation appear to be grossly intact. Psych: Limited evaluation Normal affect.  Labs    CBC  Recent Labs  03/16/16 0452 03/17/16 0435  WBC 8.6 8.3  HGB 8.7* 8.9*  HCT 27.0* 27.8*  MCV 93.4 95.2  PLT 174 194  Basic Metabolic Panel  Recent Labs  03/17/16 0435 03/18/16 0527  NA 141 142  K 4.6 4.8  CL 116* 114*  CO2 21* 22  GLUCOSE 113* 104*  BUN 28* 26*  CREATININE 1.86* 1.80*  CALCIUM 8.4* 8.6*    Telemetry    atrial fibrillation with rates 110-120s - Personally Reviewed  Patient Profile     82 year old female with newly diagnosed atrial fibrillation with RVR, history of hypertension, recurrent syncope, previous tachycardia bradycardia syndrome (SB/PSVT), now with acutely reduced LVEF 40-45%, chronic kidney disease stage III, rheumatoid arthritis, orthostatic hypertension, dementia, anticoagulation stopped due to active GI bleeding.  Assessment & Plan    1. A fib with RVR will increase metoprolol  to 50mg  TID. Consider digoxin, but will try to avoid due to volatile renal function. No anticoagulation due to recent GI bleeding, falls, anemia. Embolic risk is high, but in near term the bleeding risk is even higher. Warfarin was only realistic option due to kidney disease. Severely dilated left atrium, high likelihood she will  settle in permanent AFib . 2. Hx of tachy brady syndrome with hx of PSVT, also with HR 40-50s SR aand syncope episodes. Rhythm will be more manageable if AF is permanent, so I would not recommend antiarrhythmics. I do not think she should undergo pacemaker implantation. 3. LV dysfunction Not in overt CHF. Echo now with decreased EF 40-45%, but was normal in September.May be demand ischemia or tachycardia-related. High likelihood of underlying CAD, but she is a poor candidate for invasive evaluation or revascularization. No ischemic workup planned. Focus on rate control. Volatile renal function limits RAAS inhibitor use. 4. CKD: probably at baseline 5.HTN BP somewhat labile, but mostly controlled.  6.Dementia: family expectations seem unrealistic. Palliative care is involved. Daughter agreeable to home hospice after DC, but still engaged in aggressive Rx while here. 7. Anemia with Hgb stable after recent GI bleed this AM with bloody stool 8. Pulmonary HTN with PA pk pressure 59. Likely multifactorial, related to CHF, maybe also RA. 9. Moderate AR.   Signed, October, MD  03/18/2016, 9:10 AM

## 2016-03-19 LAB — BASIC METABOLIC PANEL
Anion gap: 5 (ref 5–15)
BUN: 30 mg/dL — AB (ref 6–20)
CALCIUM: 8.5 mg/dL — AB (ref 8.9–10.3)
CO2: 24 mmol/L (ref 22–32)
CREATININE: 1.89 mg/dL — AB (ref 0.44–1.00)
Chloride: 112 mmol/L — ABNORMAL HIGH (ref 101–111)
GFR calc Af Amer: 26 mL/min — ABNORMAL LOW (ref >60)
GFR, EST NON AFRICAN AMERICAN: 22 mL/min — AB (ref >60)
GLUCOSE: 112 mg/dL — AB (ref 65–99)
Potassium: 4.8 mmol/L (ref 3.5–5.1)
Sodium: 141 mmol/L (ref 135–145)

## 2016-03-19 LAB — CBC
HCT: 28.1 % — ABNORMAL LOW (ref 36.0–46.0)
Hemoglobin: 9 g/dL — ABNORMAL LOW (ref 12.0–15.0)
MCH: 30.2 pg (ref 26.0–34.0)
MCHC: 32 g/dL (ref 30.0–36.0)
MCV: 94.3 fL (ref 78.0–100.0)
PLATELETS: 199 10*3/uL (ref 150–400)
RBC: 2.98 MIL/uL — ABNORMAL LOW (ref 3.87–5.11)
RDW: 17.9 % — AB (ref 11.5–15.5)
WBC: 8.9 10*3/uL (ref 4.0–10.5)

## 2016-03-19 MED ORDER — METOPROLOL TARTRATE 50 MG PO TABS
100.0000 mg | ORAL_TABLET | Freq: Two times a day (BID) | ORAL | Status: DC
  Administered 2016-03-19 – 2016-03-20 (×2): 100 mg via ORAL
  Filled 2016-03-19 (×2): qty 2

## 2016-03-19 MED ORDER — METOPROLOL TARTRATE 50 MG PO TABS
50.0000 mg | ORAL_TABLET | Freq: Once | ORAL | Status: AC
  Administered 2016-03-19: 50 mg via ORAL
  Filled 2016-03-19: qty 1

## 2016-03-19 NOTE — Progress Notes (Signed)
PROGRESS NOTE    Darlene Daniels  TKW:[id number] DOB: 1925-02-14 DOA: 03/07/2016 PCP: Alva Garnet., MD    Brief Narrative:  82 year old female with history of hypertension, recurrent syncope, tachybrady syndrome, chronic kidney disease stage III, rheumatoid arthritis, orthostatic hypertension, dementia, urinary incontinence , Presents to ED with encephalopathy, secondary to UTI, as well workup significant for new onset A. fib , and elevated troponins , Head CT showing subacute/chronic left occipital infarct (new from CT since February 2017) but MRI showing chronic infarct, Seen by cardiology for A. fib management, initially on heparin GTT which has been stopped secondary to GI bleed.required  PRBC transfusion, no recurrence of GI bleed after holding anticoagulation. - Patient in A. fib with RVR, heart rates very hard to control , especially with known history of tachybradycardia syndrome with history of bradycardia in the past, the medication has been titrated slowly.  Assessment & Plan:   Principal Problem:   Acute encephalopathy Active Problems:   Rheumatoid arthritis (HCC)   Acute on chronic renal failure (HCC)   Dementia   UTI (urinary tract infection)   Atrial fibrillation with RVR (HCC)   CVA (cerebral vascular accident) (HCC)   Left ventricular systolic dysfunction   Acute post-hemorrhagic anemia   Heme positive stool   Lower GI bleed   Altered mental status   Dementia without behavioral disturbance   Goals of care, counseling/discussion   Palliative care encounter   Atrial fibrillation with RVR (HCC) -New finding this admission -Cardiology continues to follow -Not candidate for cardioversion per cardiology -CHA2DS2VASc of 6. Initially on heparin GTT,NOT  Anticoagulation candidate currently given recent anemia, GI bleeding and recurrent falls. -2-D echo repeated with EF of 40-45% and diffuse hypokinesis -Heart rate improved, to the low 100s -Recommendations from  cardiology noted. Plans to increase metoprolol to 100 mg  Respiratory distress -Patient appears comfortable at this time -Continue O2 as needed  UTI -Treated with IV Rocephin, urine  cultures with no growth -Complete a course of antibiotics  Acute metabolic encephalopathy -Seems to have improved this admission -Patient awake and conversant  GI bleed - GI consult appreciated, this is most likely lower GI bleed. - 2 units PRBC transfusion 11/17 -Remains stable -Given GI bleeding, poor candidate for prophylactic anticoagulation  Acute on chronic kidney injury stage III -Suspect secondary to dehydration and UTI.  -Creatinine improving slowly -We'll repeat renal function in the morning  Hyperkalemia -Potassium normalized  New Systolic CHF -Noted for low EF of 40-45%, Likely due to rapid A. fib. On beta blockers, not ACE I/ARB candidate given poor renal function. -Remains stable and euvolemic  Elevated troponin -Chronic and in the setting of A. Fib with RVR.  -Currently is symptomatically. Denying chest pain present  Old  CVA (cerebral vascular accident) (HCC) -chronic  left occipital infarct seen on head CT.  -MRI brain with evidence of chronic multiple lacunar infarcts -continue risk factors modifications   Dementia -Continue Namenda. -Mental status appears stable  Rheumatoid arthritis -was On Celebrex prior to admission -NSAIDs remain on hold secondary to renal insufficiency  Anemia - Acute on chronic iron deficiency, worsened most likely due to GI bleed - Hold heparin GTT, last used units PRBC 11/17 given hemoglobin of 6.8, -Hemoglobin reviewed. Currently 8.9. Stable  Acute on chronic sinusitis - Patient on Rocephin, changed to  11/18 to Augmentin  -Completed course of antibiotics. Augmentin has since been discontinued  Essential hypertension  -continue hydralazine -Blood pressure remains stable.  -Patient on Cardizem with addition of beta  blocker per above  Poor IV access and limitation to blood draw. -PICC line placed   End-of-life -Appreciate input by palliative care. Recommendations noted. Family is interested in hospice as outpatient  DVT prophylaxis: SCDs Code Status: Full code Family Communication: Patient in room, family at bedside Disposition Plan: Uncertain at this time  Consultants:  Cardiology  Hospice  Procedures:     Antimicrobials: Anti-infectives    Start     Dose/Rate Route Frequency Ordered Stop   03/13/16 1000  amoxicillin-clavulanate (AUGMENTIN) 500-125 MG per tablet 500 mg  Status:  Discontinued     1 tablet Oral Every 12 hours 03/13/16 0718 03/17/16 0758   03/11/16 1200  amoxicillin-clavulanate (AUGMENTIN) 875-125 MG per tablet 1 tablet  Status:  Discontinued     1 tablet Oral Every 12 hours 03/11/16 1125 03/13/16 0717   03/08/16 1700  cefTRIAXone (ROCEPHIN) 1 g in dextrose 5 % 50 mL IVPB  Status:  Discontinued     1 g 100 mL/hr over 30 Minutes Intravenous Every 24 hours 03/07/16 2052 03/11/16 1125   03/07/16 1645  cefTRIAXone (ROCEPHIN) 1 g in dextrose 5 % 50 mL IVPB     1 g 100 mL/hr over 30 Minutes Intravenous  Once 03/07/16 1638 03/07/16 1741      Subjective: Patient without complaints or concerns at this time  Objective: Vitals:   03/18/16 2058 03/18/16 2351 03/19/16 0538 03/19/16 1510  BP: 108/85 110/78 (!) 130/97 (!) 120/92  Pulse: (!) 119  (!) 112 82  Resp: 20  20 18   Temp: 99 F (37.2 C)  97.7 F (36.5 C) 98.9 F (37.2 C)  TempSrc: Oral  Oral Oral  SpO2: 96%  95% 96%  Weight:      Height:        Intake/Output Summary (Last 24 hours) at 03/19/16 1712 Last data filed at 03/19/16 1100  Gross per 24 hour  Intake              487 ml  Output                0 ml  Net              487 ml   Filed Weights   03/07/16 2050  Weight: 74.3 kg (163 lb 12.8 oz)    Examination:  General exam: Awake, lying in bed, no acute distress Respiratory system: Normal  respiratory effort, no audible wheezing Cardiovascular system: Mildly tachycardic, S1-S2 on auscultation Gastrointestinal system: Nondistended, positive bowel sounds Central nervous system: CN II through XII grossly intact, sensation intact Extremities: Perfused, no cyanosis Skin: Normal skin turgor, no notable skin lesions seen Psychiatry: Affect normal, no visual hallucinations  Data Reviewed: I have personally reviewed following labs and imaging studies  CBC:  Recent Labs Lab 03/14/16 0416 03/15/16 0440 03/16/16 0452 03/17/16 0435 03/19/16 0500  WBC 9.0 8.3 8.6 8.3 8.9  HGB 9.2* 8.8* 8.7* 8.9* 9.0*  HCT 28.8* 27.9* 27.0* 27.8* 28.1*  MCV 96.3 95.9 93.4 95.2 94.3  PLT 186 188 174 194 199   Basic Metabolic Panel:  Recent Labs Lab 03/15/16 0440 03/16/16 0452 03/17/16 0435 03/18/16 0527 03/19/16 0500  NA 143 143 141 142 141  K 4.8 4.6 4.6 4.8 4.8  CL 118* 118* 116* 114* 112*  CO2 20* 19* 21* 22 24  GLUCOSE 101* 105* 113* 104* 112*  BUN 26* 29* 28* 26* 30*  CREATININE 2.06* 2.02* 1.86* 1.80* 1.89*  CALCIUM 8.3* 8.4* 8.4* 8.6* 8.5*  GFR: Estimated Creatinine Clearance: 21.1 mL/min (by C-G formula based on SCr of 1.89 mg/dL (H)). Liver Function Tests: No results for input(s): AST, ALT, ALKPHOS, BILITOT, PROT, ALBUMIN in the last 168 hours. No results for input(s): LIPASE, AMYLASE in the last 168 hours. No results for input(s): AMMONIA in the last 168 hours. Coagulation Profile: No results for input(s): INR, PROTIME in the last 168 hours. Cardiac Enzymes: No results for input(s): CKTOTAL, CKMB, CKMBINDEX, TROPONINI in the last 168 hours. BNP (last 3 results) No results for input(s): PROBNP in the last 8760 hours. HbA1C: No results for input(s): HGBA1C in the last 72 hours. CBG: No results for input(s): GLUCAP in the last 168 hours. Lipid Profile: No results for input(s): CHOL, HDL, LDLCALC, TRIG, CHOLHDL, LDLDIRECT in the last 72 hours. Thyroid Function  Tests: No results for input(s): TSH, T4TOTAL, FREET4, T3FREE, THYROIDAB in the last 72 hours. Anemia Panel: No results for input(s): VITAMINB12, FOLATE, FERRITIN, TIBC, IRON, RETICCTPCT in the last 72 hours. Sepsis Labs: No results for input(s): PROCALCITON, LATICACIDVEN in the last 168 hours.  No results found for this or any previous visit (from the past 240 hour(s)).   Radiology Studies: No results found.  Scheduled Meds: . B-complex with vitamin C  1 tablet Oral Daily  . cholecalciferol  5,000 Units Oral Daily  . diltiazem  60 mg Oral Q6H  . feeding supplement (ENSURE ENLIVE)  237 mL Oral BID BM  . ferrous sulfate  325 mg Oral QPC breakfast  . folic acid  1 mg Oral Daily  . gabapentin  100 mg Oral QPM  . megestrol  400 mg Oral QPM  . memantine  14 mg Oral Daily  . metoprolol tartrate  100 mg Oral BID  . multivitamin with minerals  1 tablet Oral Daily  . oxybutynin  5 mg Oral BID  . pantoprazole  40 mg Oral Daily  . polyethylene glycol  17 g Oral Daily  . polyvinyl alcohol  1 drop Both Eyes BID  . senna  1-2 tablet Oral QHS  . sodium chloride  500 mL Intravenous Once  . sodium chloride flush  10-40 mL Intracatheter Q12H  . vitamin C  1,000 mg Oral Daily   Continuous Infusions:   LOS: 12 days   Samer Dutton, Scheryl Marten, MD [facility] Pager [phone]  If 7PM-7AM, please contact night-coverage [email or web address] Password [facility] 03/19/2016, 5:12 PM

## 2016-03-19 NOTE — Progress Notes (Signed)
Patient Name: Darlene Daniels Date of Encounter: 03/19/2016  Primary Cardiologist: [facility] Problem List     Principal Problem:   Acute encephalopathy Active Problems:   Rheumatoid arthritis (HCC)   Acute on chronic renal failure (HCC)   Dementia   UTI (urinary tract infection)   Atrial fibrillation with RVR (HCC)   CVA (cerebral vascular accident) (HCC)   Left ventricular systolic dysfunction   Acute post-hemorrhagic anemia   Heme positive stool   Lower GI bleed   Altered mental status   Dementia without behavioral disturbance   Goals of care, counseling/discussion   Palliative care encounter     Subjective   Alert, smiling, appears comfortable. Refused meds last evening. Ventricular rate 100-110s.  Inpatient Medications    Scheduled Meds: . B-complex with vitamin C  1 tablet Oral Daily  . cholecalciferol  5,000 Units Oral Daily  . diltiazem  60 mg Oral Q6H  . feeding supplement (ENSURE ENLIVE)  237 mL Oral BID BM  . ferrous sulfate  325 mg Oral QPC breakfast  . folic acid  1 mg Oral Daily  . gabapentin  100 mg Oral QPM  . megestrol  400 mg Oral QPM  . memantine  14 mg Oral Daily  . metoprolol tartrate  100 mg Oral BID  . metoprolol tartrate  50 mg Oral Once  . multivitamin with minerals  1 tablet Oral Daily  . oxybutynin  5 mg Oral BID  . pantoprazole  40 mg Oral Daily  . polyethylene glycol  17 g Oral Daily  . polyvinyl alcohol  1 drop Both Eyes BID  . senna  1-2 tablet Oral QHS  . sodium chloride  500 mL Intravenous Once  . sodium chloride flush  10-40 mL Intracatheter Q12H  . vitamin C  1,000 mg Oral Daily   Continuous Infusions:  PRN Meds: acetaminophen **OR** acetaminophen, sodium chloride flush   Vital Signs    Vitals:   03/18/16 1501 03/18/16 2058 03/18/16 2351 03/19/16 0538  BP: 110/71 108/85 110/78 (!) 130/97  Pulse: 84 (!) 119  (!) 112  Resp: 20 20  20   Temp: 98 F (36.7 C) 99 F (37.2 C)  97.7 F (36.5 C)  TempSrc: Oral  Oral  Oral  SpO2: 100% 96%  95%  Weight:      Height:        Intake/Output Summary (Last 24 hours) at 03/19/16 1104 Last data filed at 03/19/16 0941  Gross per 24 hour  Intake              250 ml  Output                0 ml  Net              250 ml   Filed Weights   03/07/16 2050  Weight: 163 lb 12.8 oz (74.3 kg)    Physical Exam  Disoriented, but alert and engaging GEN: Well nourished, well developed, in no acute distress.  HEENT: Grossly normal.  Neck: Supple, no JVD, carotid bruits, or masses. Cardiac: irregular, no murmurs, rubs, or gallops. No clubbing, cyanosis, edema.  Radials/DP/PT 2+ and equal bilaterally.  Respiratory:  Respirations regular and unlabored, clear to auscultation bilaterally. GI: Soft, nontender, nondistended, BS + x 4. MS: no deformity or atrophy. Skin: warm and dry, no rash. Neuro:  Strength and sensation appear to be grossly intact. Psych: Limited evaluation Normal affect.  Labs    CBC  Recent  Labs  03/17/16 0435 03/19/16 0500  WBC 8.3 8.9  HGB 8.9* 9.0*  HCT 27.8* 28.1*  MCV 95.2 94.3  PLT 194 199   Basic Metabolic Panel  Recent Labs  03/18/16 0527 03/19/16 0500  NA 142 141  K 4.8 4.8  CL 114* 112*  CO2 22 24  GLUCOSE 104* 112*  BUN 26* 30*  CREATININE 1.80* 1.89*  CALCIUM 8.6* 8.5*    Telemetry    AFib, rate 110 - Personally Reviewed  Patient Profile     82 year old female with newly diagnosed atrial fibrillation with RVR, history of hypertension, recurrent syncope, previous tachycardia bradycardia syndrome (SB/PSVT), now with acutely reduced LVEF 40-45%, chronic kidney disease stage III, rheumatoid arthritis, orthostatic hypertension, dementia, anticoagulation stopped due to active GI bleeding.  Assessment & Plan    1. A fib with RVR will increase metoprolol  to 100 mg BID. Consider digoxin, but will try to avoid due to volatile renal function. No anticoagulation due to recent GI bleeding, falls, anemia. Embolic  risk is high, but in near term the bleeding risk is even higher. Warfarin was only realistic option due to kidney disease. Severely dilated left atrium, high likelihood she will settle in permanent AFib. Avoid amiodarone due to tachy-brady. 2. Hx of tachy brady syndrome with hx of PSVT, also with HR 40-50s SR and syncope episodes. Rhythm will be more manageable if AF is permanent, so I would not recommend antiarrhythmics. I do not think she should undergo pacemaker implantation. 3. LV dysfunction Not in overt CHF. Echo now with decreased EF 40-45%, but was normal in September.May be demand ischemia or tachycardia-related. High likelihood of underlying CAD, but she is a poor candidate for invasive evaluation or revascularization. No ischemic workup planned. Focus on rate control. Volatile renal function limits RAAS inhibitor use. 4. CKD: probably at baseline 5.HTN BP controlled.  6.Dementia: family expectations seemed unrealistic, but her daughter seems to be a little more aware of the poor prognosis today. Palliative care is involved. Daughter agreeable to home hospice after DC, but still engaged in aggressive Rx while here. 7. Anemia with Hgb stable after recent GI bleed this AM with bloody stool 8. Pulmonary HTN with PA pk pressure 59. Likely multifactorial, related to CHF, maybe also RA. 9. Moderate AR.   Signed, Thurmon Fair, MD  03/19/2016, 11:04 AM

## 2016-03-20 LAB — BASIC METABOLIC PANEL
Anion gap: 5 (ref 5–15)
BUN: 30 mg/dL — AB (ref 6–20)
CO2: 24 mmol/L (ref 22–32)
CREATININE: 1.88 mg/dL — AB (ref 0.44–1.00)
Calcium: 8.4 mg/dL — ABNORMAL LOW (ref 8.9–10.3)
Chloride: 113 mmol/L — ABNORMAL HIGH (ref 101–111)
GFR, EST AFRICAN AMERICAN: 26 mL/min — AB (ref >60)
GFR, EST NON AFRICAN AMERICAN: 23 mL/min — AB (ref >60)
Glucose, Bld: 100 mg/dL — ABNORMAL HIGH (ref 65–99)
POTASSIUM: 4.5 mmol/L (ref 3.5–5.1)
Sodium: 142 mmol/L (ref 135–145)

## 2016-03-20 MED ORDER — MEMANTINE HCL ER 14 MG PO CP24: 14.0000 mg | ORAL_CAPSULE | Freq: Every day | ORAL | 0 refills | Status: AC

## 2016-03-20 MED ORDER — DILTIAZEM HCL 60 MG PO TABS: 60.0000 mg | ORAL_TABLET | Freq: Four times a day (QID) | ORAL | 0 refills | Status: AC

## 2016-03-20 MED ORDER — METOPROLOL TARTRATE 100 MG PO TABS: 100.0000 mg | ORAL_TABLET | Freq: Two times a day (BID) | ORAL | 0 refills | Status: AC

## 2016-03-20 NOTE — Progress Notes (Signed)
Discharge instructions and medication were reviewed with patient's son. All questions were answered. No concerns at this time.

## 2016-03-20 NOTE — Progress Notes (Signed)
Patient Name: Darlene RicherDorothy Igo Date of Encounter: 03/20/2016  Primary Cardiologist: Dr. Alicia AmelSmith  Hospital Problem List     Principal Problem:   Acute encephalopathy Active Problems:   Rheumatoid arthritis (HCC)   Acute on chronic renal failure (HCC)   Dementia   UTI (urinary tract infection)   Atrial fibrillation with RVR (HCC)   CVA (cerebral vascular accident) (HCC)   Left ventricular systolic dysfunction   Acute post-hemorrhagic anemia   Heme positive stool   Lower GI bleed   Altered mental status   Dementia without behavioral disturbance   Goals of care, counseling/discussion   Palliative care encounter    Subjective   Pt sleepy but arousable, denies any CP or SOB. She's unable to tell me where she is.  Inpatient Medications    . B-complex with vitamin C  1 tablet Oral Daily  . cholecalciferol  5,000 Units Oral Daily  . diltiazem  60 mg Oral Q6H  . feeding supplement (ENSURE ENLIVE)  237 mL Oral BID BM  . ferrous sulfate  325 mg Oral QPC breakfast  . folic acid  1 mg Oral Daily  . gabapentin  100 mg Oral QPM  . megestrol  400 mg Oral QPM  . memantine  14 mg Oral Daily  . metoprolol tartrate  100 mg Oral BID  . multivitamin with minerals  1 tablet Oral Daily  . oxybutynin  5 mg Oral BID  . pantoprazole  40 mg Oral Daily  . polyethylene glycol  17 g Oral Daily  . polyvinyl alcohol  1 drop Both Eyes BID  . senna  1-2 tablet Oral QHS  . sodium chloride  500 mL Intravenous Once  . sodium chloride flush  10-40 mL Intracatheter Q12H  . vitamin C  1,000 mg Oral Daily    Vital Signs    Vitals:   03/19/16 0538 03/19/16 1510 03/19/16 2307 03/20/16 0604  BP: (!) 130/97 (!) 120/92 122/75 126/87  Pulse: (!) 112 82 86 91  Resp: 20 18 18 18   Temp: 97.7 F (36.5 C) 98.9 F (37.2 C) 98 F (36.7 C) 98.3 F (36.8 C)  TempSrc: Oral Oral Oral Oral  SpO2: 95% 96% 95% 96%  Weight:      Height:        Intake/Output Summary (Last 24 hours) at 03/20/16 1135 Last data  filed at 03/20/16 0654  Gross per 24 hour  Intake              440 ml  Output                0 ml  Net              440 ml   Filed Weights   03/07/16 2050  Weight: 163 lb 12.8 oz (74.3 kg)    Physical Exam    General: Well developed, well nourished frail elderly AAF, in no acute distress. HEENT: Normocephalic, atraumatic, sclera non-icteric, no xanthomas, nares are without discharge. Neck: Negative for carotid bruits. JVP not elevated. Lungs: Clear bilaterally to auscultation without wheezes, rales, or rhonchi. Breathing is unlabored. Cardiac: Rapid, irregular, S1 S2 without murmurs, rubs, or gallops.  Abdomen: Soft, non-tender, non-distended with normoactive bowel sounds. No rebound/guarding. Extremities: No clubbing or cyanosis. No edema. Distal pedal pulses are 2+ and equal bilaterally. Skin: Warm and dry, no significant rash. Neuro: Does follow commands but goes back to sleep easily. Unable to tell me where she is or the date. Psych:  Pleasant affect  Labs    CBC  Recent Labs  03/19/16 0500  WBC 8.9  HGB 9.0*  HCT 28.1*  MCV 94.3  PLT 199   Basic Metabolic Panel  Recent Labs  03/19/16 0500 03/20/16 0610  NA 141 142  K 4.8 4.5  CL 112* 113*  CO2 24 24  GLUCOSE 112* 100*  BUN 30* 30*  CREATININE 1.89* 1.88*  CALCIUM 8.5* 8.4*     Telemetry    Atrial fib 110-120s  Radiology    Dg Chest 2 View  Result Date: 03/07/2016 CLINICAL DATA:  Altered mental status, lethargy for the past 3 days. EXAM: CHEST  2 VIEW COMPARISON:  PA and lateral chest x-ray of January 04, 2016 FINDINGS: The lungs are adequately inflated. There is no focal infiltrate. There is no pleural effusion. The cardiac silhouette remains enlarged. The pulmonary vascularity is prominent centrally but there is no cephalization of the vascular pattern. There is calcification in the wall of the aortic arch. The mediastinum is normal in width. There is no pleural effusion. The bony thorax exhibits  no acute abnormality. There degenerative changes of both shoulders. IMPRESSION: Cardiomegaly without pulmonary edema.  No acute pneumonia. Thoracic aortic atherosclerosis. Electronically Signed   By: David  [place] M.D.   On: 03/07/2016 14:06   Ct Head Wo Contrast  Result Date: 03/07/2016 CLINICAL DATA:  Altered mental status, weakness and lethargy for 3 days. EXAM: CT HEAD WITHOUT CONTRAST TECHNIQUE: Contiguous axial images were obtained from the base of the skull through the vertex without intravenous contrast. COMPARISON:  05/30/2015 FINDINGS: Brain: Stable age related cerebral atrophy, ventriculomegaly and periventricular white matter disease. New area of low attenuation in the left occipital lobe consistent with an infarct. This does not appear acute and could be subacute or chronic. No extra-axial fluid collections. No intracranial hemorrhage. No mass lesions. Vascular: Stable extensive vascular calcifications but no aneurysm or hyperdense vessels. Skull: No skull fracture or bone lesion. Sinuses/Orbits: Extensive paranasal sinus disease with possible sinonasal polyposis. Evidence of prior sinus surgery. The globes are intact. Other: No scalp lesions. IMPRESSION: Left occipital infarct is new since the prior CT from February 2017 but does not appear acute. It is probably subacute or chronic. No intracranial hemorrhage or mass lesion. Chronic sinus disease with possible sinonasal polyposis. Electronically Signed   By: Rudie Meyer M.D.   On: 03/07/2016 14:35   Mr Brain Wo Contrast  Result Date: 03/09/2016 CLINICAL DATA:  82 y/o F; 1 week of increasing confusion and lethargy. EXAM: MRI HEAD WITHOUT CONTRAST TECHNIQUE: Multiplanar, multiecho pulse sequences of the brain and surrounding structures were obtained without intravenous contrast. COMPARISON:  05/03/2012 MRI of the head.  03/07/2016 CT of the head. FINDINGS: Brain: No diffusion signal abnormality. Patchy T2 FLAIR hyperintense white matter  signal abnormality minute predominantly perivascular distribution compatible with advanced chronic microvascular ischemic changes. Chronic left occipital infarct. Multiple chronic small lacunar infarcts within the basal ganglia bilaterally, right pons, and bilateral cerebellar hemispheres. Extensive diffuse brain parenchymal volume loss. Punctate focus of susceptibility hypointensity and right posterior temporal lobe is compatible hemosiderin deposition from old microhemorrhage. Vascular: Normal flow voids. Skull and upper cervical spine: Normal marrow signal. Sinuses/Orbits: Extensive paranasal sinus mucosal thickening and opacification of right maxillary, sphenoid, ethmoid, and frontal sinuses with fluid levels. Partial opacification of mastoid tips bilaterally. Other: None. IMPRESSION: 1. No acute intracranial abnormality identified. 2. Advanced chronic microvascular ischemic changes, parenchymal volume loss, multiple chronic lacunar infarcts in the cerebellum, pons, and  basal ganglia, as well as left occipital chronic infarct. Findings are progressed from 2014. 3. Severe paranasal sinus disease with fluid levels which may represent acute on chronic sinusitis in the appropriate clinical setting. Electronically Signed   By: Mitzi Hansen M.D.   On: 03/09/2016 19:08   Dg Chest Port 1 View  Result Date: 03/15/2016 CLINICAL DATA:  Shortness of Breath EXAM: PORTABLE CHEST 1 VIEW COMPARISON:  03/14/2016 FINDINGS: Cardiac shadow remains enlarged. Aortic calcifications are again seen. Bilateral pleural effusions are again noted. A right-sided PICC line is seen in satisfactory position. No new focal abnormality is noted. IMPRESSION: No significant change from the prior exam. Small bilateral pleural effusions are seen. Electronically Signed   By: Alcide Clever M.D.   On: 03/15/2016 07:45   Dg Chest Port 1 View  Result Date: 03/14/2016 CLINICAL DATA:  Cough, congestion. EXAM: PORTABLE CHEST 1 VIEW  COMPARISON:  Radiograph of March 08, 2016. FINDINGS: Stable cardiomegaly. Atherosclerosis of thoracic aorta is noted. Right-sided PICC line is unchanged in position. No pneumothorax is noted. Increased bibasilar opacities are noted concerning for edema or atelectasis with associated pleural effusions. Bony thorax is unremarkable. IMPRESSION: Aortic atherosclerosis. Increased bibasilar edema or atelectasis with associated pleural effusions. Electronically Signed   By: Lupita Raider, M.D.   On: 03/14/2016 13:51   Dg Chest Port 1 View  Result Date: 03/08/2016 CLINICAL DATA:  Central catheter placement EXAM: PORTABLE CHEST 1 VIEW COMPARISON:  March 07, 2016 FINDINGS: Central catheter tip is at the cavoatrial junction. No pneumothorax. There is atelectatic change in the left mid lung. Lungs elsewhere are clear. Stable cardiomegaly with pulmonary vascularity within normal limits. There is atherosclerotic calcification aorta. No adenopathy. No bone lesions. IMPRESSION: Central catheter tip at cavoatrial junction without pneumothorax. Stable cardiomegaly. Left midlung atelectasis. No edema or consolidation. Electronically Signed   By: Bretta Bang III M.D.   On: 03/08/2016 13:12     Patient Profile     14F with hypertension, recurrent syncope, tachy-brady syndrome, PSVT, chronic kidney disease stage III, rheumatoid arthritis, orthostatic hypertension, dementia, urinary incontinence was brought to the ED by her daughter with increased confusion and lethargy for past 1 week, associated with poor appetite - found to be in A fib with RVR. Also with heme+ stools/worsening anemia after starting heparin gtt, subacute/chronic stroke, UTI, AKI on CKD (Cr 3.6 on adm), hyperkalemia. Has history of tachy-brady syndrome per Dr. Michaelle Copas notes in 2014. No further details on this are available. She had 3 hospital encounters for syncope in 2017 - Feb 2017 (felt due to orthostasis), April 2017 (felt due to  orthostasis), and September 2017 (presumed vasovagal). Family reports HR baseline around 50 at times, but also 70s-80s with occasional bursts per daughter's log.  Assessment & Plan    1. Atrial fib RVR (suspected paroxysmal), being managed in the context of history of tachy-brady syndrome - difficult situation. HR remains elevated 110-120 range despite frequent med titration - now on oral diltiazem and increased metoprolol without significant change from the 120s-130s of last week. She is not a candidate for pacemaker given her advanced dementia and not a candidate for cardioversion as she cannot be anticoagulated due to her GIB and falls. Long talk with daughter last week. Despite aggressive rate controlling therapy and multiple adjustments, her HR remains stubborn and difficult to control. Son and daughter have raised concern of "diminishing returns" - that despite med increase, we are seeing minimal response. At this point I'm not sure we have  much else to offer by way of HR control. The patient actually looks better than she did last week. She still is unable to tell me where she is or participate meaningfully in the conversation, but her breathing is unlabored and she is comfortable. Her son said today they are hopeful she will get up and walk with PT. The patient has been essentially bedbound since 11/14 so I am worried the expectation of her recovery exceeds the reality of Ms. Kizer' complex medical status.  2. LV dysfunction by echo 03/08/16 - reduced LVEF 40-45% may be due to AF RVR. Even though CCB less ideal with LV dysfunction, this may be tachy-induced so it's worth trying. No ACEI/ARB due to renal failure.  3. Elevated troponin - no clear trend and not consistent with ACS; pt not a candidate for aggressive cardiac eval.   4. Medical issues above with acute encephalopathy, subacute/chronic stroke, UTI, AKI on CKD, GIB/ABL anemia - per IM/GI. Per GI, suspect NSAID induced gastropathy on  Celebrex, continuing conservative management.   5. HTN - labile, follow.  Signed, Laurann Montana, PA-C  03/20/2016, 11:35 AM   I have personally seen and examined this patient with Ronie Spies, PA-C I agree with the assessment and plan as outlined above. The patient has many issues and it seems that her dementia is advanced. She is likely moving toward palliative care. Her heart rate control is purely a palliative issue. I do not think she is a candidate for anti-coagulation. I would continue the current meds for heart rate control. No other recommendations.   Verne Carrow 03/20/2016 12:21 PM

## 2016-03-20 NOTE — Progress Notes (Signed)
Daily Progress Note   Patient Name: Darlene Daniels       Date: 03/20/2016 DOB: 21-Apr-1925  Age: 82 y.o. MRN#: [id number] Attending Physician: Donne Hazel, MD Primary Care Physician: Salena Saner., MD Admit Date: 03/07/2016  Reason for Consultation/Follow-up: Establishing goals of care  Subjective: Met with family son today.  He reports that his mother has been feeling well and he is hopeful that she will be able to be discharged home today.    Darlene Daniels was sleeping but aroused easily and denied complaints.  When son asked if she wanted to go home, she stated that she did.  Length of Stay: 13  Current Medications: Scheduled Meds:  . B-complex with vitamin C  1 tablet Oral Daily  . cholecalciferol  5,000 Units Oral Daily  . diltiazem  60 mg Oral Q6H  . feeding supplement (ENSURE ENLIVE)  237 mL Oral BID BM  . ferrous sulfate  325 mg Oral QPC breakfast  . folic acid  1 mg Oral Daily  . gabapentin  100 mg Oral QPM  . megestrol  400 mg Oral QPM  . memantine  14 mg Oral Daily  . metoprolol tartrate  100 mg Oral BID  . multivitamin with minerals  1 tablet Oral Daily  . oxybutynin  5 mg Oral BID  . pantoprazole  40 mg Oral Daily  . polyethylene glycol  17 g Oral Daily  . polyvinyl alcohol  1 drop Both Eyes BID  . senna  1-2 tablet Oral QHS  . sodium chloride  500 mL Intravenous Once  . sodium chloride flush  10-40 mL Intracatheter Q12H  . vitamin C  1,000 mg Oral Daily    Continuous Infusions:   PRN Meds: acetaminophen **OR** acetaminophen, sodium chloride flush  Physical Exam         General: Sleeping during exam in no acute distress.  HEENT: No bruits, no goiter, no JVD Heart: Irregular. Lungs: Regular, no distress Neuro: Sleeping   Vital Signs: BP 126/87  (BP Location: Right Arm)   Pulse 91 Comment: RN Melinda notified  Temp 98.3 F (36.8 C) (Oral)   Resp 18   Ht _0  (1.753 m)   Wt 74.3 kg (163 lb 12.8 oz)   SpO2 96%   BMI 24.19 kg/m  SpO2: SpO2: 96 %  O2 Device: O2 Device: Not Delivered O2 Flow Rate: O2 Flow Rate (L/min): 2 L/min  Intake/output summary:   Intake/Output Summary (Last 24 hours) at 03/20/16 1348 Last data filed at 03/20/16 0654  Gross per 24 hour  Intake              440 ml  Output                0 ml  Net              440 ml   LBM: Last BM Date: 03/20/16 Baseline Weight: Weight: 74.3 kg (163 lb 12.8 oz) Most recent weight: Weight: 74.3 kg (163 lb 12.8 oz)       Palliative Assessment/Data:    Flowsheet Rows   Flowsheet Row Most Recent Value  Intake Tab  Referral Department  Hospitalist  Unit at Time of Referral  Med/Surg Unit  Palliative Care Primary Diagnosis  Cardiac  Date Notified  03/15/16  Palliative Care Type  New Palliative care  Reason for referral  Clarify Goals of Care  Date of Admission  03/07/16  Date first seen by Palliative Care  03/15/16  # of days Palliative referral response time  0 Day(s)  # of days IP prior to Palliative referral  8  Clinical Assessment  Palliative Performance Scale Score  30%  Pain Max last 24 hours  Not able to report  Pain Min Last 24 hours  Not able to report  Psychosocial & Spiritual Assessment  Palliative Care Outcomes  Patient/Family meeting held?  Yes  Who was at the meeting?  Daughter, Darlene Daniels  Palliative Care Outcomes  Counseled regarding hospice, ACP counseling assistance      Patient Active Problem List   Diagnosis Date Noted  . Dementia without behavioral disturbance   . Goals of care, counseling/discussion   . Palliative care encounter   . Altered mental status   . Lower GI bleed   . Left ventricular systolic dysfunction 95/63/8756  . Acute post-hemorrhagic anemia   . Heme positive stool   . UTI (urinary tract infection) 03/07/2016  .  Acute encephalopathy 03/07/2016  . Atrial fibrillation with RVR ([place]) 03/07/2016  . CVA (cerebral vascular accident) ([place]) 03/07/2016  . Dementia 01/04/2016  . Acute on chronic renal failure ([place]) 01/03/2016  . Orthostatic hypotension 05/30/2015  . Macrocytic anemia 05/30/2015  . AKI (acute kidney injury) ([place]) 05/30/2015  . Syncope and collapse 05/30/2015  . Essential hypertension, benign 03/10/2013  . Tachy-brady syndrome ([place]) 03/10/2013    Class: Chronic  . Memory loss   . Palpitations   . Paroxysmal tachycardia, unspecified ([place])   . Arthritis   . Urinary incontinence   . Rheumatoid arthritis ([place])   . Tachycardia   . Encephalopathy 12/27/2012  . Abnormality of gait 12/27/2012    Palliative Care Assessment & Plan   Assessment: 82 y.o. female  with past medical history of hypertension, recurrent syncope, tachybrady syndrome, chronic kidney disease stage III, rheumatoid arthritis, orthostatic hypertension, dementia, urinary incontinence admitted on 03/07/2016 with encephalopathy, UTI, new onset A. fib , and elevated troponins. She was placed on heparin drip and subsequently developed GI bleed. Her heart rate has been difficult to control. Plan for home once medically maximized. Palliative consulted for goals of care.   Recommendations/Plan:  Son is hopeful that his mother will be able to be discharged today.  D/w Dr. Wyline Copas and plan for home with hospice this afternoon.  She remains FULL CODE. This is something  that hospice will be able to continue to address and process with family following discharge.  Goals of Care and Additional Recommendations:  Plan for enrollment with hospice through [facility] on discharge  Code Status:    Code Status Orders        Start     Ordered   03/07/16 2053  Full code  Continuous     03/07/16 2052    Code Status History    Date Active Date Inactive Code Status Order ID Comments User Context   01/04/2016 12:09 AM 01/04/2016 10:11 PM Full  Code [id number]  Toy Baker, MD Inpatient   05/30/2015  6:29 PM 06/01/2015 11:23 PM Full Code [id number]  Samella Parr, NP Inpatient   05/30/2015  5:53 PM 05/30/2015  6:29 PM Full Code [id number]  Costin Karlyne Greenspan, MD Inpatient    Advance Directive Documentation   Flowsheet Row Most Recent Value  Type of Advance Directive  Living will, Healthcare Power of Attorney  Pre-existing out of facility DNR order (yellow form or pink MOST form)  No data  "MOST" Form in Place?  No data       Prognosis:   < 6 months  Discharge Planning: Home with Hospice   Care plan was discussed with son  Thank you for allowing the Palliative Medicine Team to assist in the care of this patient.   Time In: 5396 Time Out: 1300 Total Time 15 Prolonged Time Billed No      Greater than 50%  of this time was spent counseling and coordinating care related to the above assessment and plan.  Micheline Rough, MD  Please contact Palliative Medicine Team phone at [phone] for questions and concerns.

## 2016-03-20 NOTE — Care Management Important Message (Signed)
Important Message  Patient Details  Name: Aleli Navedo MRN: [id number] Date of Birth: December 02, 1924   Medicare Important Message Given:  Yes    Haskell Flirt 03/20/2016, 10:43 AMImportant Message  Patient Details  Name: Kasidy Gianino MRN: [id number] Date of Birth: 31-Mar-1925   Medicare Important Message Given:  Yes    Haskell Flirt 03/20/2016, 10:43 AM

## 2016-03-20 NOTE — Progress Notes (Signed)
Nutrition Follow-up  DOCUMENTATION CODES:   Not applicable  INTERVENTION:   Ensure Enlive po BID, each supplement provides 350 kcal and 20 grams of protein  NUTRITION DIAGNOSIS:   Swallowing difficulty related to dysphagia, chronic illness (dementia) as evidenced by per patient/family report.  GOAL:   Patient will meet greater than or equal to 90% of their needs  MONITOR:   PO intake, Supplement acceptance, Labs, Weight trends, I & O's, Skin  ASSESSMENT:   82 year old female with history of hypertension, recurrent syncope, tachybrady syndrome, chronic kidney disease stage III, rheumatoid arthritis, orthostatic hypertension, dementia, urinary incontinence , Presents to ED with encephalopathy, secondary to UTI, as well workup significant for new onset A. fib , and elevated troponins , Head CT showing subacute/chronic left occipital infarct (new from CT since February 2017) but MRI showing chronic infarct.    Met with patient and patient's family members in room today. Family reports that patient is eating ~50% meals and drinking 50-75% Ensures. Family is present at bedside on most days to assist patient with feedings. Patient remains on Megace which family believes is helping. Patient with GI bleed possibly related to anticoagulants. Has had 2 units prbcs. No new weight since admit. PICC in place.   Medications reviewed and include: B complex, vitamin D, ferrous sulfate, folic acid, megace, multivitamin w/ minerals, protonix, miralax, senokot, vitamin C   Labs reviewed: Cl 113(H), BUN 30(H), creat 1.88(H), Ca 8.4(L)  Hgb 9.0(L), Hct 28.1(L)   Diet Order:  DIET - DYS 1 Room service appropriate? Yes; Fluid consistency: Thin  Skin:  Reviewed, no issues (MSAD to perineum)  Last BM:  03/12/2016  Height:   Ht Readings from Last 1 Encounters:  03/07/16 _0  (1.753 m)    Weight:   Wt Readings from Last 1 Encounters:  03/07/16 163 lb 12.8 oz (74.3 kg)    Ideal Body Weight:   65.9 kg  BMI:  Body mass index is 24.19 kg/m.  Estimated Nutritional Needs:   Kcal:  1485-1735 (MSJ x 1.2-1.4)  Protein:  75-90 grams (1-1.2 grams/kg)  Fluid:  >/= 1.8 L/day (25 ml/kg)  EDUCATION NEEDS:   No education needs identified at this time  Koleen Distance, RD, LDN

## 2016-03-20 NOTE — Discharge Summary (Signed)
Physician Discharge Summary  Darlene Daniels LPF:[id number] DOB: [birth year] DOA: 03/07/2016  PCP: Alva Garnet., MD  Admit date: 03/07/2016 Discharge date: 03/20/2016  Admitted From: Home Disposition:  Home with home hospice  Recommendations for Outpatient Follow-up:  1. Follow up with PCP in 1-2 weeks 2. Further recommendations per Hospice services   Discharge Condition:Stable CODE STATUS:Full Diet recommendation: Heart healthy   Brief/Interim Summary: 82 year old female with history of hypertension, recurrent syncope, tachybrady syndrome, chronic kidney disease stage III, rheumatoid arthritis, orthostatic hypertension, dementia, urinary incontinence , Presents to ED with encephalopathy, secondary to UTI, as well workup significant for new onset A. fib , and elevated troponins , Head CT showing subacute/chronic left occipital infarct (new from CT since February 2017) but MRI showing chronic infarct, Seen by cardiology for A. fib management, initially on heparin GTT which has been stopped secondary to GI bleed.required PRBC transfusion, no recurrence of GI bleed after holding anticoagulation. - Patient in A. fib with RVR, heart rates very hard to control , especially with known history of tachybradycardia syndrome with history of bradycardia in the past, the medication has been titrated slowly.  Atrial fibrillation with RVR (HCC) -New finding this admission -Cardiology continues to follow -Not candidate for cardioversion per cardiology -CHA2DS2VASc of 6. Initially on heparin GTT,NOT Anticoagulation candidate currently given recent anemia, GI bleeding and recurrent falls. -2-D echo repeated with EF of 40-45% and diffuse hypokinesis -Heart rate improved, to the low 100s -Recommendations from cardiology noted. Metoprolol was increased to 100mg  bid, patient's heart rated remained intermittently tachycardic -Discussed case wit Cardiology who recommends follow up with Hospice. Per  cardiology, patient is clear for discharge today.  Respiratory distress -Patient appears comfortable at this time -remained stable  UTI -Treated with IV Rocephin, urine cultures with no growth -Complete a course of antibiotics  Acute metabolic encephalopathy -Seems to have improved this admission -Patient awake and conversant  GI bleed - GI consult appreciated, this is most likely lower GI bleed. - 2 units PRBC transfusion 11/17 -Remains stable -Given GI bleeding, poor candidate for prophylactic anticoagulation  Acute on chronic kidney injury stage III -Suspect secondary to dehydration and UTI.  -Creatinine improving slowly  Hyperkalemia -Potassium normalized  New Systolic CHF -Noted for low EF of 40-45%, Likely due to rapid A. fib. On beta blockers, not ACE I/ARB candidate given poor renal function. -Patient remained stable and euvolemic  Elevated troponin -Chronic and in the setting of A. Fib with RVR.  -Currently is symptomatically. Denying chest pain present -No further cardiac work up recommended by Cardiology  Old CVA (cerebral vascular accident) (HCC) -chronic left occipital infarct seen on head CT.  -MRI brain with evidence of chronic multiple lacunar infarcts -continue risk factors modifications   Dementia -Continue Namenda. -Mental status appears stable  Rheumatoid arthritis -was on Celebrex prior to admission -NSAIDs remain on hold secondary to renal insufficiency  Anemia - Acute on chronic iron deficiency, worsened most likely due to GI bleed - Hold heparin GTT, last used units PRBC 11/17 given hemoglobin of 6.8, -Hemoglobin reviewed. Hemoglobin had remained stable  Acute on chronic sinusitis - Patient on Rocephin, changed to 11/18 to Augmentin  -Completed course of antibiotics. Augmentin has since been discontinued  Essential hypertension  -continue hydralazine -Blood pressure remains stable.  -Patient on Cardizem with  addition of beta blocker per above  Poor IV access and limitation to blood draw. -PICC line placed   End-of-life -Appreciate input by palliative care. Recommendations noted. Family is interested in hospice as  outpatient  Discharge Diagnoses:  Principal Problem:   Acute encephalopathy Active Problems:   Rheumatoid arthritis (HCC)   Acute on chronic renal failure (HCC)   Dementia   UTI (urinary tract infection)   Atrial fibrillation with RVR (HCC)   CVA (cerebral vascular accident) (HCC)   Left ventricular systolic dysfunction   Acute post-hemorrhagic anemia   Heme positive stool   Lower GI bleed   Altered mental status   Dementia without behavioral disturbance   Goals of care, counseling/discussion   Palliative care encounter  Discharge Instructions    Medication List    STOP taking these medications   aspirin EC 81 MG tablet   bisacodyl 5 MG EC tablet Commonly known as:  bisacodyl   CELEBREX 200 MG capsule Generic drug:  celecoxib   hydrALAZINE 25 MG tablet Commonly known as:  APRESOLINE     TAKE these medications   b complex vitamins tablet Take 1 tablet by mouth daily.   diltiazem 60 MG tablet Commonly known as:  CARDIZEM Take 1 tablet (60 mg total) by mouth every 6 (six) hours.   ferrous sulfate 325 (65 FE) MG tablet Take 325 mg by mouth daily.   folic acid 800 MCG tablet Commonly known as:  FOLVITE Take 800 mcg by mouth daily.   gabapentin 100 MG capsule Commonly known as:  NEURONTIN Take 100 mg by mouth every evening.   HUMIRA 40 MG/0.8ML Pskt Generic drug:  Adalimumab Inject 40 mg into the skin every 14 (fourteen) days.   megestrol 40 MG/ML suspension Commonly known as:  MEGACE Take 400 mg by mouth every evening.   memantine 14 MG Cp24 24 hr capsule Commonly known as:  NAMENDA XR Take 1 capsule (14 mg total) by mouth daily. What changed:  medication strength  how much to take   metoprolol 100 MG tablet Commonly known as:   LOPRESSOR Take 1 tablet (100 mg total) by mouth 2 (two) times daily. What changed:  medication strength  how much to take  when to take this   multivitamin capsule Take 1 capsule by mouth daily.   OVER THE COUNTER MEDICATION Take 80 mg by mouth daily. Zinc 80mg    oxybutynin 5 MG tablet Commonly known as:  DITROPAN Take 5 mg by mouth 2 (two) times daily.   polyvinyl alcohol 1.4 % ophthalmic solution Commonly known as:  LIQUIFILM TEARS Place 1 drop into both eyes 2 (two) times daily.   senna 8.6 MG Tabs tablet Commonly known as:  SENOKOT Take 1-2 tablets (8.6-17.2 mg total) by mouth at bedtime.   vitamin C 1000 MG tablet Take 1,000 mg by mouth daily.   Vitamin D3 5000 units Tabs Take 5,000 Units by mouth daily.       Allergies  Allergen Reactions  . Sulfa Antibiotics Other (See Comments)    Unknown reaction      Procedures/Studies: Dg Chest 2 View  Result Date: 03/07/2016 CLINICAL DATA:  Altered mental status, lethargy for the past 3 days. EXAM: CHEST  2 VIEW COMPARISON:  PA and lateral chest x-ray of January 04, 2016 FINDINGS: The lungs are adequately inflated. There is no focal infiltrate. There is no pleural effusion. The cardiac silhouette remains enlarged. The pulmonary vascularity is prominent centrally but there is no cephalization of the vascular pattern. There is calcification in the wall of the aortic arch. The mediastinum is normal in width. There is no pleural effusion. The bony thorax exhibits no acute abnormality. There degenerative changes of both  shoulders. IMPRESSION: Cardiomegaly without pulmonary edema.  No acute pneumonia. Thoracic aortic atherosclerosis. Electronically Signed   By: David  [place] M.D.   On: 03/07/2016 14:06   Ct Head Wo Contrast  Result Date: 03/07/2016 CLINICAL DATA:  Altered mental status, weakness and lethargy for 3 days. EXAM: CT HEAD WITHOUT CONTRAST TECHNIQUE: Contiguous axial images were obtained from the base of the  skull through the vertex without intravenous contrast. COMPARISON:  05/30/2015 FINDINGS: Brain: Stable age related cerebral atrophy, ventriculomegaly and periventricular white matter disease. New area of low attenuation in the left occipital lobe consistent with an infarct. This does not appear acute and could be subacute or chronic. No extra-axial fluid collections. No intracranial hemorrhage. No mass lesions. Vascular: Stable extensive vascular calcifications but no aneurysm or hyperdense vessels. Skull: No skull fracture or bone lesion. Sinuses/Orbits: Extensive paranasal sinus disease with possible sinonasal polyposis. Evidence of prior sinus surgery. The globes are intact. Other: No scalp lesions. IMPRESSION: Left occipital infarct is new since the prior CT from February 2017 but does not appear acute. It is probably subacute or chronic. No intracranial hemorrhage or mass lesion. Chronic sinus disease with possible sinonasal polyposis. Electronically Signed   By: Rudie Meyer M.D.   On: 03/07/2016 14:35   Mr Brain Wo Contrast  Result Date: 03/09/2016 CLINICAL DATA:  82 y/o F; 1 week of increasing confusion and lethargy. EXAM: MRI HEAD WITHOUT CONTRAST TECHNIQUE: Multiplanar, multiecho pulse sequences of the brain and surrounding structures were obtained without intravenous contrast. COMPARISON:  05/03/2012 MRI of the head.  03/07/2016 CT of the head. FINDINGS: Brain: No diffusion signal abnormality. Patchy T2 FLAIR hyperintense white matter signal abnormality minute predominantly perivascular distribution compatible with advanced chronic microvascular ischemic changes. Chronic left occipital infarct. Multiple chronic small lacunar infarcts within the basal ganglia bilaterally, right pons, and bilateral cerebellar hemispheres. Extensive diffuse brain parenchymal volume loss. Punctate focus of susceptibility hypointensity and right posterior temporal lobe is compatible hemosiderin deposition from old  microhemorrhage. Vascular: Normal flow voids. Skull and upper cervical spine: Normal marrow signal. Sinuses/Orbits: Extensive paranasal sinus mucosal thickening and opacification of right maxillary, sphenoid, ethmoid, and frontal sinuses with fluid levels. Partial opacification of mastoid tips bilaterally. Other: None. IMPRESSION: 1. No acute intracranial abnormality identified. 2. Advanced chronic microvascular ischemic changes, parenchymal volume loss, multiple chronic lacunar infarcts in the cerebellum, pons, and basal ganglia, as well as left occipital chronic infarct. Findings are progressed from 2014. 3. Severe paranasal sinus disease with fluid levels which may represent acute on chronic sinusitis in the appropriate clinical setting. Electronically Signed   By: Mitzi Hansen M.D.   On: 03/09/2016 19:08   Dg Chest Port 1 View  Result Date: 03/15/2016 CLINICAL DATA:  Shortness of Breath EXAM: PORTABLE CHEST 1 VIEW COMPARISON:  03/14/2016 FINDINGS: Cardiac shadow remains enlarged. Aortic calcifications are again seen. Bilateral pleural effusions are again noted. A right-sided PICC line is seen in satisfactory position. No new focal abnormality is noted. IMPRESSION: No significant change from the prior exam. Small bilateral pleural effusions are seen. Electronically Signed   By: Alcide Clever M.D.   On: 03/15/2016 07:45   Dg Chest Port 1 View  Result Date: 03/14/2016 CLINICAL DATA:  Cough, congestion. EXAM: PORTABLE CHEST 1 VIEW COMPARISON:  Radiograph of March 08, 2016. FINDINGS: Stable cardiomegaly. Atherosclerosis of thoracic aorta is noted. Right-sided PICC line is unchanged in position. No pneumothorax is noted. Increased bibasilar opacities are noted concerning for edema or atelectasis with associated pleural effusions. Bony  thorax is unremarkable. IMPRESSION: Aortic atherosclerosis. Increased bibasilar edema or atelectasis with associated pleural effusions. Electronically Signed    By: Lupita Raider, M.D.   On: 03/14/2016 13:51   Dg Chest Port 1 View  Result Date: 03/08/2016 CLINICAL DATA:  Central catheter placement EXAM: PORTABLE CHEST 1 VIEW COMPARISON:  March 07, 2016 FINDINGS: Central catheter tip is at the cavoatrial junction. No pneumothorax. There is atelectatic change in the left mid lung. Lungs elsewhere are clear. Stable cardiomegaly with pulmonary vascularity within normal limits. There is atherosclerotic calcification aorta. No adenopathy. No bone lesions. IMPRESSION: Central catheter tip at cavoatrial junction without pneumothorax. Stable cardiomegaly. Left midlung atelectasis. No edema or consolidation. Electronically Signed   By: Bretta Bang III M.D.   On: 03/08/2016 13:12     Subjective: No complaints.  Discharge Exam: Vitals:   03/19/16 2307 03/20/16 0604  BP: 122/75 126/87  Pulse: 86 91  Resp: 18 18  Temp: 98 F (36.7 C) 98.3 F (36.8 C)   Vitals:   03/19/16 0538 03/19/16 1510 03/19/16 2307 03/20/16 0604  BP: (!) 130/97 (!) 120/92 122/75 126/87  Pulse: (!) 112 82 86 91  Resp: 20 18 18 18   Temp: 97.7 F (36.5 C) 98.9 F (37.2 C) 98 F (36.7 C) 98.3 F (36.8 C)  TempSrc: Oral Oral Oral Oral  SpO2: 95% 96% 95% 96%  Weight:      Height:        General: Pt is alert, awake, not in acute distress Cardiovascular: RRR, S1/S2 +, no rubs, no gallops Respiratory: CTA bilaterally, no wheezing, no rhonchi Abdominal: Soft, NT, ND, bowel sounds + Extremities: no edema, no cyanosis   The results of significant diagnostics from this hospitalization (including imaging, microbiology, ancillary and laboratory) are listed below for reference.     Microbiology: No results found for this or any previous visit (from the past 240 hour(s)).   Labs: BNP (last 3 results)  Recent Labs  05/31/15 1304  BNP 719.0*   Basic Metabolic Panel:  Recent Labs Lab 03/16/16 0452 03/17/16 0435 03/18/16 0527 03/19/16 0500 03/20/16 0610  NA  143 141 142 141 142  K 4.6 4.6 4.8 4.8 4.5  CL 118* 116* 114* 112* 113*  CO2 19* 21* 22 24 24   GLUCOSE 105* 113* 104* 112* 100*  BUN 29* 28* 26* 30* 30*  CREATININE 2.02* 1.86* 1.80* 1.89* 1.88*  CALCIUM 8.4* 8.4* 8.6* 8.5* 8.4*   Liver Function Tests: No results for input(s): AST, ALT, ALKPHOS, BILITOT, PROT, ALBUMIN in the last 168 hours. No results for input(s): LIPASE, AMYLASE in the last 168 hours. No results for input(s): AMMONIA in the last 168 hours. CBC:  Recent Labs Lab 03/14/16 0416 03/15/16 0440 03/16/16 0452 03/17/16 0435 03/19/16 0500  WBC 9.0 8.3 8.6 8.3 8.9  HGB 9.2* 8.8* 8.7* 8.9* 9.0*  HCT 28.8* 27.9* 27.0* 27.8* 28.1*  MCV 96.3 95.9 93.4 95.2 94.3  PLT 186 188 174 194 199   Cardiac Enzymes: No results for input(s): CKTOTAL, CKMB, CKMBINDEX, TROPONINI in the last 168 hours. BNP: Invalid input(s): POCBNP CBG: No results for input(s): GLUCAP in the last 168 hours. D-Dimer No results for input(s): DDIMER in the last 72 hours. Hgb A1c No results for input(s): HGBA1C in the last 72 hours. Lipid Profile No results for input(s): CHOL, HDL, LDLCALC, TRIG, CHOLHDL, LDLDIRECT in the last 72 hours. Thyroid function studies No results for input(s): TSH, T4TOTAL, T3FREE, THYROIDAB in the last 72 hours.  Invalid  input(s): FREET3 Anemia work up No results for input(s): VITAMINB12, FOLATE, FERRITIN, TIBC, IRON, RETICCTPCT in the last 72 hours. Urinalysis    Component Value Date/Time   COLORURINE YELLOW 03/07/2016 1551   APPEARANCEUR CLOUDY (A) 03/07/2016 1551   LABSPEC 1.018 03/07/2016 1551   PHURINE 6.5 03/07/2016 1551   GLUCOSEU NEGATIVE 03/07/2016 1551   HGBUR LARGE (A) 03/07/2016 1551   BILIRUBINUR NEGATIVE 03/07/2016 1551   KETONESUR NEGATIVE 03/07/2016 1551   PROTEINUR 100 (A) 03/07/2016 1551   NITRITE NEGATIVE 03/07/2016 1551   LEUKOCYTESUR LARGE (A) 03/07/2016 1551   Sepsis Labs Invalid input(s): PROCALCITONIN,  WBC,   LACTICIDVEN Microbiology No results found for this or any previous visit (from the past 240 hour(s)).   SIGNED:   Jerald Kief, MD  [facility] 03/20/2016, 1:20 PM  If 7PM-7AM, please contact night-coverage [email or web address] Password [email or web address]

## 2016-04-24 DEATH — deceased

## 2018-05-01 IMAGING — DX DG CHEST 1V PORT
1 series · 1 of 1 positions shown · non-contrast
Comparison: March 07, 2016

CLINICAL DATA: Central catheter placement

EXAM:
PORTABLE CHEST 1 VIEW

[chest ap]
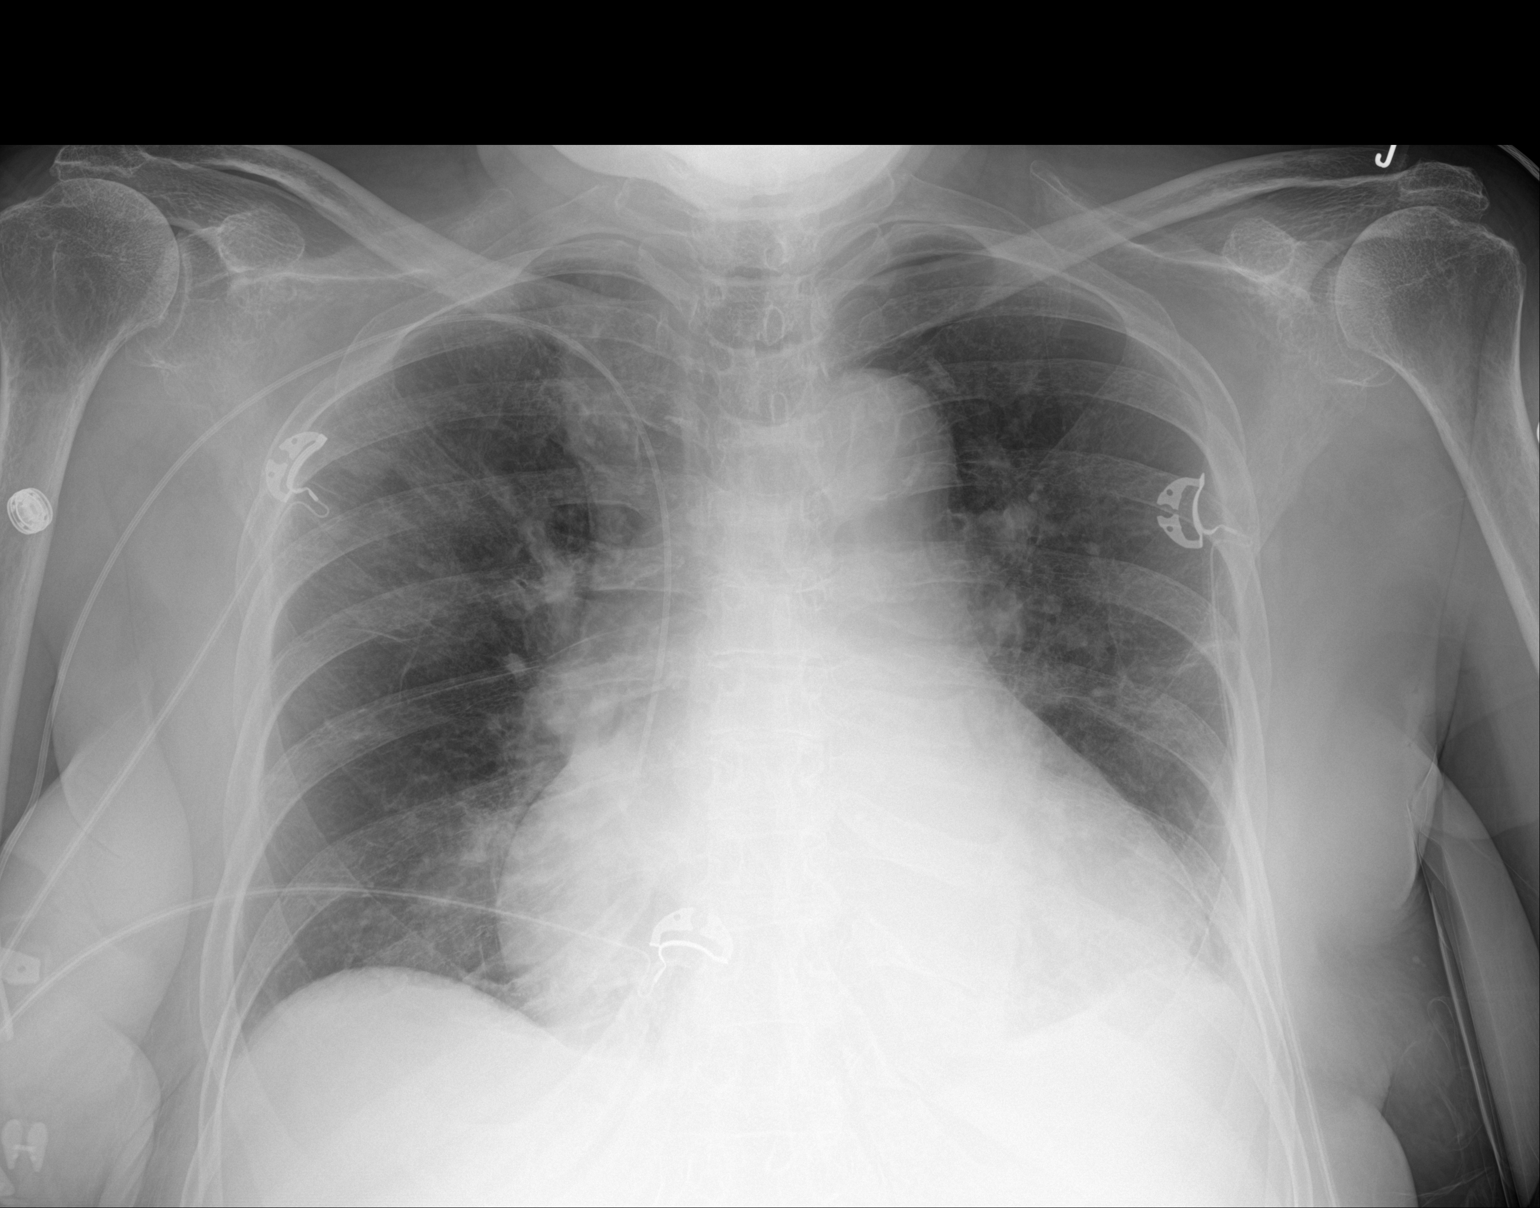

[1 of 1 positions shown; findings below may reference images not displayed]

FINDINGS: Central catheter tip is at the cavoatrial junction. No pneumothorax.
There is atelectatic change in the left mid lung. Lungs elsewhere
are clear. Stable cardiomegaly with pulmonary vascularity within
normal limits. There is atherosclerotic calcification aorta. No
adenopathy. No bone lesions.
IMPRESSION: Central catheter tip at cavoatrial junction without pneumothorax.
Stable cardiomegaly. Left midlung atelectasis. No edema or
consolidation.
